# Patient Record
Sex: Female | Born: 1960 | Race: White | Hispanic: No | Marital: Single | Smoking: Never smoker
Health system: Southern US, Community
[De-identification: ages and names within clinical notes are randomized; demographics above are authoritative.]

## PROBLEM LIST (undated history)

## (undated) DIAGNOSIS — T7840XA Allergy, unspecified, initial encounter: Secondary | ICD-10-CM

## (undated) DIAGNOSIS — N83209 Unspecified ovarian cyst, unspecified side: Secondary | ICD-10-CM

## (undated) DIAGNOSIS — K449 Diaphragmatic hernia without obstruction or gangrene: Secondary | ICD-10-CM

## (undated) DIAGNOSIS — K589 Irritable bowel syndrome without diarrhea: Secondary | ICD-10-CM

## (undated) DIAGNOSIS — R112 Nausea with vomiting, unspecified: Secondary | ICD-10-CM

## (undated) DIAGNOSIS — K219 Gastro-esophageal reflux disease without esophagitis: Secondary | ICD-10-CM

## (undated) DIAGNOSIS — S0300XA Dislocation of jaw, unspecified side, initial encounter: Secondary | ICD-10-CM

## (undated) DIAGNOSIS — K625 Hemorrhage of anus and rectum: Secondary | ICD-10-CM

## (undated) DIAGNOSIS — K626 Ulcer of anus and rectum: Secondary | ICD-10-CM

## (undated) DIAGNOSIS — J349 Unspecified disorder of nose and nasal sinuses: Secondary | ICD-10-CM

## (undated) DIAGNOSIS — M810 Age-related osteoporosis without current pathological fracture: Secondary | ICD-10-CM

## (undated) DIAGNOSIS — E559 Vitamin D deficiency, unspecified: Secondary | ICD-10-CM

## (undated) DIAGNOSIS — I10 Essential (primary) hypertension: Secondary | ICD-10-CM

## (undated) HISTORY — DX: Essential (primary) hypertension: I10

## (undated) HISTORY — DX: Unspecified disorder of nose and nasal sinuses: J34.9

## (undated) HISTORY — DX: Gastro-esophageal reflux disease without esophagitis: K21.9

## (undated) HISTORY — DX: Age-related osteoporosis without current pathological fracture: M81.0

## (undated) HISTORY — PX: ANAL FISSURE REPAIR: SHX2312

## (undated) HISTORY — PX: FOOT SURGERY: SHX648

## (undated) HISTORY — PX: COLONOSCOPY W/ POLYPECTOMY: SHX1380

## (undated) HISTORY — PX: BACK SURGERY: SHX140

## (undated) HISTORY — PX: HEMORROIDECTOMY: SUR656

## (undated) HISTORY — DX: Nausea with vomiting, unspecified: R11.2

## (undated) HISTORY — DX: Dislocation of jaw, unspecified side, initial encounter: S03.00XA

## (undated) HISTORY — DX: Ulcer of anus and rectum: K62.6

## (undated) HISTORY — DX: Diaphragmatic hernia without obstruction or gangrene: K44.9

## (undated) HISTORY — DX: Irritable bowel syndrome, unspecified: K58.9

## (undated) HISTORY — DX: Vitamin D deficiency, unspecified: E55.9

## (undated) HISTORY — DX: Hemorrhage of anus and rectum: K62.5

## (undated) HISTORY — DX: Allergy, unspecified, initial encounter: T78.40XA

## (undated) HISTORY — PX: EYE SURGERY: SHX253

---

## 1982-09-15 HISTORY — PX: CHOLECYSTECTOMY: SHX55

## 1997-12-12 ENCOUNTER — Other Ambulatory Visit: Admission: RE | Admit: 1997-12-12 | Discharge: 1997-12-12 | Payer: Self-pay | Admitting: Family Medicine

## 1997-12-25 ENCOUNTER — Other Ambulatory Visit: Admission: RE | Admit: 1997-12-25 | Discharge: 1997-12-25 | Payer: Self-pay | Admitting: Family Medicine

## 1997-12-27 ENCOUNTER — Ambulatory Visit (HOSPITAL_COMMUNITY): Admission: RE | Admit: 1997-12-27 | Discharge: 1997-12-27 | Payer: Self-pay

## 1998-01-03 ENCOUNTER — Ambulatory Visit (HOSPITAL_COMMUNITY): Admission: RE | Admit: 1998-01-03 | Discharge: 1998-01-03 | Payer: Self-pay

## 1998-01-22 ENCOUNTER — Other Ambulatory Visit: Admission: RE | Admit: 1998-01-22 | Discharge: 1998-01-22 | Payer: Self-pay | Admitting: Family Medicine

## 1998-04-05 ENCOUNTER — Other Ambulatory Visit: Admission: RE | Admit: 1998-04-05 | Discharge: 1998-04-05 | Payer: Self-pay | Admitting: Family Medicine

## 1998-11-26 ENCOUNTER — Ambulatory Visit (HOSPITAL_COMMUNITY): Admission: RE | Admit: 1998-11-26 | Discharge: 1998-11-26 | Payer: Self-pay

## 1998-11-27 ENCOUNTER — Ambulatory Visit (HOSPITAL_BASED_OUTPATIENT_CLINIC_OR_DEPARTMENT_OTHER): Admission: RE | Admit: 1998-11-27 | Discharge: 1998-11-27 | Payer: Self-pay

## 1999-01-15 ENCOUNTER — Other Ambulatory Visit: Admission: RE | Admit: 1999-01-15 | Discharge: 1999-01-15 | Payer: Self-pay | Admitting: Family Medicine

## 1999-10-29 ENCOUNTER — Ambulatory Visit (HOSPITAL_BASED_OUTPATIENT_CLINIC_OR_DEPARTMENT_OTHER): Admission: RE | Admit: 1999-10-29 | Discharge: 1999-10-29 | Payer: Self-pay

## 2000-03-25 ENCOUNTER — Other Ambulatory Visit: Admission: RE | Admit: 2000-03-25 | Discharge: 2000-03-25 | Payer: Self-pay | Admitting: Family Medicine

## 2000-04-03 ENCOUNTER — Ambulatory Visit (HOSPITAL_COMMUNITY): Admission: RE | Admit: 2000-04-03 | Discharge: 2000-04-03 | Payer: Self-pay | Admitting: *Deleted

## 2000-07-08 ENCOUNTER — Encounter (INDEPENDENT_AMBULATORY_CARE_PROVIDER_SITE_OTHER): Payer: Self-pay | Admitting: *Deleted

## 2000-07-08 ENCOUNTER — Ambulatory Visit (HOSPITAL_BASED_OUTPATIENT_CLINIC_OR_DEPARTMENT_OTHER): Admission: RE | Admit: 2000-07-08 | Discharge: 2000-07-08 | Payer: Self-pay

## 2000-10-29 ENCOUNTER — Other Ambulatory Visit: Admission: RE | Admit: 2000-10-29 | Discharge: 2000-10-29 | Payer: Self-pay

## 2000-10-29 ENCOUNTER — Encounter (INDEPENDENT_AMBULATORY_CARE_PROVIDER_SITE_OTHER): Payer: Self-pay | Admitting: Specialist

## 2001-01-19 ENCOUNTER — Encounter (INDEPENDENT_AMBULATORY_CARE_PROVIDER_SITE_OTHER): Payer: Self-pay

## 2001-01-19 ENCOUNTER — Ambulatory Visit (HOSPITAL_COMMUNITY): Admission: RE | Admit: 2001-01-19 | Discharge: 2001-01-19 | Payer: Self-pay

## 2001-07-23 ENCOUNTER — Other Ambulatory Visit: Admission: RE | Admit: 2001-07-23 | Discharge: 2001-07-23 | Payer: Self-pay | Admitting: Family Medicine

## 2001-11-30 ENCOUNTER — Encounter (INDEPENDENT_AMBULATORY_CARE_PROVIDER_SITE_OTHER): Payer: Self-pay

## 2001-11-30 ENCOUNTER — Ambulatory Visit (HOSPITAL_COMMUNITY): Admission: RE | Admit: 2001-11-30 | Discharge: 2001-11-30 | Payer: Self-pay

## 2002-02-16 ENCOUNTER — Encounter (INDEPENDENT_AMBULATORY_CARE_PROVIDER_SITE_OTHER): Payer: Self-pay | Admitting: *Deleted

## 2002-02-16 ENCOUNTER — Ambulatory Visit (HOSPITAL_BASED_OUTPATIENT_CLINIC_OR_DEPARTMENT_OTHER): Admission: RE | Admit: 2002-02-16 | Discharge: 2002-02-16 | Payer: Self-pay

## 2002-03-28 ENCOUNTER — Ambulatory Visit (HOSPITAL_COMMUNITY): Admission: RE | Admit: 2002-03-28 | Discharge: 2002-03-28 | Payer: Self-pay

## 2002-03-28 ENCOUNTER — Encounter (INDEPENDENT_AMBULATORY_CARE_PROVIDER_SITE_OTHER): Payer: Self-pay | Admitting: Specialist

## 2002-11-29 ENCOUNTER — Encounter (INDEPENDENT_AMBULATORY_CARE_PROVIDER_SITE_OTHER): Payer: Self-pay | Admitting: Specialist

## 2002-11-29 ENCOUNTER — Ambulatory Visit (HOSPITAL_COMMUNITY): Admission: RE | Admit: 2002-11-29 | Discharge: 2002-11-29 | Payer: Self-pay

## 2003-02-24 ENCOUNTER — Ambulatory Visit (HOSPITAL_COMMUNITY): Admission: RE | Admit: 2003-02-24 | Discharge: 2003-02-24 | Payer: Self-pay

## 2003-05-15 ENCOUNTER — Encounter: Payer: Self-pay | Admitting: Occupational Therapy

## 2003-05-15 ENCOUNTER — Ambulatory Visit (HOSPITAL_COMMUNITY): Admission: RE | Admit: 2003-05-15 | Discharge: 2003-05-15 | Payer: Self-pay | Admitting: Family Medicine

## 2003-11-15 ENCOUNTER — Encounter (INDEPENDENT_AMBULATORY_CARE_PROVIDER_SITE_OTHER): Payer: Self-pay | Admitting: Specialist

## 2003-11-15 ENCOUNTER — Ambulatory Visit (HOSPITAL_COMMUNITY): Admission: RE | Admit: 2003-11-15 | Discharge: 2003-11-15 | Payer: Self-pay

## 2003-11-15 ENCOUNTER — Ambulatory Visit (HOSPITAL_BASED_OUTPATIENT_CLINIC_OR_DEPARTMENT_OTHER): Admission: RE | Admit: 2003-11-15 | Discharge: 2003-11-15 | Payer: Self-pay

## 2004-02-06 ENCOUNTER — Ambulatory Visit (HOSPITAL_COMMUNITY): Admission: RE | Admit: 2004-02-06 | Discharge: 2004-02-06 | Payer: Self-pay

## 2004-02-06 ENCOUNTER — Ambulatory Visit (HOSPITAL_BASED_OUTPATIENT_CLINIC_OR_DEPARTMENT_OTHER): Admission: RE | Admit: 2004-02-06 | Discharge: 2004-02-06 | Payer: Self-pay

## 2004-05-24 ENCOUNTER — Ambulatory Visit: Payer: Self-pay | Admitting: Family Medicine

## 2004-05-24 ENCOUNTER — Other Ambulatory Visit: Admission: RE | Admit: 2004-05-24 | Discharge: 2004-05-24 | Payer: Self-pay | Admitting: Family Medicine

## 2004-06-20 ENCOUNTER — Ambulatory Visit: Payer: Self-pay | Admitting: Family Medicine

## 2004-06-26 ENCOUNTER — Ambulatory Visit: Payer: Self-pay | Admitting: Family Medicine

## 2004-08-28 ENCOUNTER — Ambulatory Visit: Payer: Self-pay | Admitting: Family Medicine

## 2004-10-01 ENCOUNTER — Ambulatory Visit (HOSPITAL_COMMUNITY): Admission: RE | Admit: 2004-10-01 | Discharge: 2004-10-01 | Payer: Self-pay

## 2004-10-01 ENCOUNTER — Encounter (INDEPENDENT_AMBULATORY_CARE_PROVIDER_SITE_OTHER): Payer: Self-pay | Admitting: Specialist

## 2004-10-01 ENCOUNTER — Ambulatory Visit (HOSPITAL_BASED_OUTPATIENT_CLINIC_OR_DEPARTMENT_OTHER): Admission: RE | Admit: 2004-10-01 | Discharge: 2004-10-01 | Payer: Self-pay

## 2004-12-23 ENCOUNTER — Ambulatory Visit: Payer: Self-pay | Admitting: Family Medicine

## 2004-12-25 ENCOUNTER — Ambulatory Visit: Payer: Self-pay | Admitting: Family Medicine

## 2005-01-27 ENCOUNTER — Ambulatory Visit (HOSPITAL_COMMUNITY): Admission: RE | Admit: 2005-01-27 | Discharge: 2005-01-27 | Payer: Self-pay

## 2005-02-03 ENCOUNTER — Ambulatory Visit: Payer: Self-pay | Admitting: Family Medicine

## 2005-02-14 ENCOUNTER — Ambulatory Visit: Payer: Self-pay | Admitting: Family Medicine

## 2005-04-09 ENCOUNTER — Ambulatory Visit: Payer: Self-pay | Admitting: Internal Medicine

## 2005-05-05 ENCOUNTER — Ambulatory Visit: Payer: Self-pay | Admitting: Family Medicine

## 2005-05-14 ENCOUNTER — Ambulatory Visit (HOSPITAL_COMMUNITY): Admission: RE | Admit: 2005-05-14 | Discharge: 2005-05-14 | Payer: Self-pay | Admitting: Family Medicine

## 2005-08-21 ENCOUNTER — Ambulatory Visit: Payer: Self-pay | Admitting: Family Medicine

## 2005-08-29 ENCOUNTER — Ambulatory Visit: Payer: Self-pay | Admitting: Family Medicine

## 2005-09-17 ENCOUNTER — Ambulatory Visit (HOSPITAL_COMMUNITY): Admission: RE | Admit: 2005-09-17 | Discharge: 2005-09-17 | Payer: Self-pay

## 2005-09-17 ENCOUNTER — Encounter (INDEPENDENT_AMBULATORY_CARE_PROVIDER_SITE_OTHER): Payer: Self-pay | Admitting: Specialist

## 2005-12-16 ENCOUNTER — Ambulatory Visit: Payer: Self-pay | Admitting: Family Medicine

## 2005-12-22 ENCOUNTER — Ambulatory Visit: Payer: Self-pay | Admitting: Family Medicine

## 2006-01-16 ENCOUNTER — Ambulatory Visit (HOSPITAL_COMMUNITY): Admission: RE | Admit: 2006-01-16 | Discharge: 2006-01-16 | Payer: Self-pay

## 2006-01-16 ENCOUNTER — Encounter (INDEPENDENT_AMBULATORY_CARE_PROVIDER_SITE_OTHER): Payer: Self-pay | Admitting: Specialist

## 2006-03-19 ENCOUNTER — Ambulatory Visit (HOSPITAL_COMMUNITY): Admission: RE | Admit: 2006-03-19 | Discharge: 2006-03-19 | Payer: Self-pay | Admitting: Gastroenterology

## 2006-04-20 ENCOUNTER — Ambulatory Visit: Payer: Self-pay | Admitting: Family Medicine

## 2006-04-29 ENCOUNTER — Ambulatory Visit: Payer: Self-pay | Admitting: Family Medicine

## 2006-05-27 ENCOUNTER — Ambulatory Visit (HOSPITAL_COMMUNITY): Admission: RE | Admit: 2006-05-27 | Discharge: 2006-05-27 | Payer: Self-pay | Admitting: Family Medicine

## 2006-06-29 ENCOUNTER — Ambulatory Visit: Payer: Self-pay | Admitting: Family Medicine

## 2006-06-29 LAB — CONVERTED CEMR LAB

## 2006-09-28 ENCOUNTER — Ambulatory Visit (HOSPITAL_COMMUNITY): Admission: RE | Admit: 2006-09-28 | Discharge: 2006-09-28 | Payer: Self-pay

## 2006-09-28 ENCOUNTER — Encounter (INDEPENDENT_AMBULATORY_CARE_PROVIDER_SITE_OTHER): Payer: Self-pay | Admitting: *Deleted

## 2006-11-02 ENCOUNTER — Ambulatory Visit: Payer: Self-pay | Admitting: Family Medicine

## 2006-12-28 ENCOUNTER — Encounter: Admission: RE | Admit: 2006-12-28 | Discharge: 2006-12-28 | Payer: Self-pay | Admitting: Gastroenterology

## 2007-01-07 ENCOUNTER — Ambulatory Visit (HOSPITAL_COMMUNITY): Admission: RE | Admit: 2007-01-07 | Discharge: 2007-01-07 | Payer: Self-pay | Admitting: Gastroenterology

## 2007-01-21 ENCOUNTER — Emergency Department (HOSPITAL_COMMUNITY): Admission: EM | Admit: 2007-01-21 | Discharge: 2007-01-21 | Payer: Self-pay | Admitting: Emergency Medicine

## 2007-02-19 ENCOUNTER — Encounter (INDEPENDENT_AMBULATORY_CARE_PROVIDER_SITE_OTHER): Payer: Self-pay | Admitting: Family Medicine

## 2007-02-19 DIAGNOSIS — D509 Iron deficiency anemia, unspecified: Secondary | ICD-10-CM

## 2007-02-19 DIAGNOSIS — K649 Unspecified hemorrhoids: Secondary | ICD-10-CM | POA: Insufficient documentation

## 2007-02-19 DIAGNOSIS — R197 Diarrhea, unspecified: Secondary | ICD-10-CM | POA: Insufficient documentation

## 2007-02-19 DIAGNOSIS — K589 Irritable bowel syndrome without diarrhea: Secondary | ICD-10-CM

## 2007-02-19 DIAGNOSIS — N390 Urinary tract infection, site not specified: Secondary | ICD-10-CM | POA: Insufficient documentation

## 2007-02-19 DIAGNOSIS — F6812 Factitious disorder with predominantly physical signs and symptoms: Secondary | ICD-10-CM

## 2007-02-19 DIAGNOSIS — J309 Allergic rhinitis, unspecified: Secondary | ICD-10-CM | POA: Insufficient documentation

## 2007-02-26 ENCOUNTER — Encounter: Admission: RE | Admit: 2007-02-26 | Discharge: 2007-02-26 | Payer: Self-pay | Admitting: Gastroenterology

## 2007-04-26 ENCOUNTER — Ambulatory Visit: Payer: Self-pay | Admitting: Family Medicine

## 2007-04-26 ENCOUNTER — Encounter (INDEPENDENT_AMBULATORY_CARE_PROVIDER_SITE_OTHER): Payer: Self-pay | Admitting: Internal Medicine

## 2007-04-26 LAB — CONVERTED CEMR LAB
BUN: 7 mg/dL (ref 6–23)
Basophils Absolute: 0 10*3/uL (ref 0.0–0.1)
CO2: 21 meq/L (ref 19–32)
Calcium: 9.6 mg/dL (ref 8.4–10.5)
Cholesterol: 183 mg/dL (ref 0–200)
Creatinine, Ser: 0.6 mg/dL (ref 0.40–1.20)
Eosinophils Relative: 1 % (ref 0–5)
Glucose, Bld: 84 mg/dL (ref 70–99)
HCT: 34.3 % — ABNORMAL LOW (ref 36.0–46.0)
HDL: 61 mg/dL (ref >39)
Hemoglobin: 10.1 g/dL — ABNORMAL LOW (ref 12.0–15.0)
Lymphocytes Relative: 25 % (ref 12–46)
Monocytes Absolute: 0.2 10*3/uL (ref 0.2–0.7)
Monocytes Relative: 4 % (ref 3–11)
RBC: 4.47 M/uL (ref 3.87–5.11)
RDW: 16.8 % — ABNORMAL HIGH (ref 11.5–14.0)
Total Bilirubin: 0.5 mg/dL (ref 0.3–1.2)
Total CHOL/HDL Ratio: 3
Triglycerides: 98 mg/dL (ref <150)
VLDL: 20 mg/dL (ref 0–40)

## 2007-05-10 ENCOUNTER — Ambulatory Visit: Payer: Self-pay | Admitting: Family Medicine

## 2007-06-01 ENCOUNTER — Ambulatory Visit (HOSPITAL_COMMUNITY): Admission: RE | Admit: 2007-06-01 | Discharge: 2007-06-01 | Payer: Self-pay | Admitting: Occupational Therapy

## 2007-06-08 ENCOUNTER — Encounter: Admission: RE | Admit: 2007-06-08 | Discharge: 2007-06-08 | Payer: Self-pay | Admitting: Family Medicine

## 2007-12-07 ENCOUNTER — Ambulatory Visit: Payer: Self-pay | Admitting: Internal Medicine

## 2008-06-01 ENCOUNTER — Ambulatory Visit (HOSPITAL_COMMUNITY): Admission: RE | Admit: 2008-06-01 | Discharge: 2008-06-01 | Payer: Self-pay | Admitting: Family Medicine

## 2008-06-30 ENCOUNTER — Ambulatory Visit: Payer: Self-pay | Admitting: Internal Medicine

## 2008-06-30 ENCOUNTER — Encounter (INDEPENDENT_AMBULATORY_CARE_PROVIDER_SITE_OTHER): Payer: Self-pay | Admitting: Adult Health

## 2008-06-30 ENCOUNTER — Other Ambulatory Visit: Admission: RE | Admit: 2008-06-30 | Discharge: 2008-06-30 | Payer: Self-pay | Admitting: Adult Health

## 2008-06-30 LAB — CONVERTED CEMR LAB
ALT: 8 units/L (ref 0–35)
BUN: 10 mg/dL (ref 6–23)
Basophils Absolute: 0 10*3/uL (ref 0.0–0.1)
CO2: 25 meq/L (ref 19–32)
Calcium: 9.7 mg/dL (ref 8.4–10.5)
Chloride: 104 meq/L (ref 96–112)
Creatinine, Ser: 0.66 mg/dL (ref 0.40–1.20)
Glucose, Bld: 103 mg/dL — ABNORMAL HIGH (ref 70–99)
HCT: 43.5 % (ref 36.0–46.0)
Hemoglobin: 14.7 g/dL (ref 12.0–15.0)
Lymphocytes Relative: 39 % (ref 12–46)
Lymphs Abs: 2.2 10*3/uL (ref 0.7–4.0)
Monocytes Absolute: 0.3 10*3/uL (ref 0.1–1.0)
Monocytes Relative: 5 % (ref 3–12)
Neutro Abs: 3.1 10*3/uL (ref 1.7–7.7)
RBC: 4.82 M/uL (ref 3.87–5.11)
T4, Total: 9 ug/dL (ref 5.0–12.5)
TSH: 1.457 microintl units/mL (ref 0.350–4.50)
Vit D, 1,25-Dihydroxy: 9 — ABNORMAL LOW (ref 30–89)
WBC: 5.7 10*3/uL (ref 4.0–10.5)

## 2008-07-18 ENCOUNTER — Encounter (INDEPENDENT_AMBULATORY_CARE_PROVIDER_SITE_OTHER): Payer: Self-pay | Admitting: Adult Health

## 2008-07-18 ENCOUNTER — Ambulatory Visit: Payer: Self-pay | Admitting: Internal Medicine

## 2008-07-18 LAB — CONVERTED CEMR LAB
LDL Cholesterol: 117 mg/dL — ABNORMAL HIGH (ref 0–99)
Total CHOL/HDL Ratio: 3
VLDL: 15 mg/dL (ref 0–40)

## 2008-11-10 ENCOUNTER — Ambulatory Visit: Payer: Self-pay | Admitting: Internal Medicine

## 2008-11-10 ENCOUNTER — Encounter (INDEPENDENT_AMBULATORY_CARE_PROVIDER_SITE_OTHER): Payer: Self-pay | Admitting: Adult Health

## 2008-11-10 LAB — CONVERTED CEMR LAB
ALT: 8 units/L (ref 0–35)
AST: 15 units/L (ref 0–37)
Albumin: 4.7 g/dL (ref 3.5–5.2)
Basophils Absolute: 0 10*3/uL (ref 0.0–0.1)
Basophils Relative: 0 % (ref 0–1)
Calcium: 9.8 mg/dL (ref 8.4–10.5)
Chloride: 105 meq/L (ref 96–112)
MCHC: 32.3 g/dL (ref 30.0–36.0)
Monocytes Relative: 5 % (ref 3–12)
Neutro Abs: 3.1 10*3/uL (ref 1.7–7.7)
Neutrophils Relative %: 68 % (ref 43–77)
Potassium: 4 meq/L (ref 3.5–5.3)
RBC: 4.99 M/uL (ref 3.87–5.11)
RDW: 13.5 % (ref 11.5–15.5)

## 2008-11-11 ENCOUNTER — Encounter (INDEPENDENT_AMBULATORY_CARE_PROVIDER_SITE_OTHER): Payer: Self-pay | Admitting: Adult Health

## 2008-11-17 ENCOUNTER — Ambulatory Visit: Payer: Self-pay | Admitting: Internal Medicine

## 2008-11-18 ENCOUNTER — Encounter (INDEPENDENT_AMBULATORY_CARE_PROVIDER_SITE_OTHER): Payer: Self-pay | Admitting: Adult Health

## 2008-12-01 ENCOUNTER — Encounter (INDEPENDENT_AMBULATORY_CARE_PROVIDER_SITE_OTHER): Payer: Self-pay | Admitting: Adult Health

## 2008-12-01 ENCOUNTER — Ambulatory Visit: Payer: Self-pay | Admitting: Internal Medicine

## 2008-12-01 LAB — CONVERTED CEMR LAB
AST: 12 units/L (ref 0–37)
Albumin: 4.5 g/dL (ref 3.5–5.2)
Alkaline Phosphatase: 69 units/L (ref 39–117)
BUN: 8 mg/dL (ref 6–23)
Basophils Absolute: 0 10*3/uL (ref 0.0–0.1)
Lymphocytes Relative: 30 % (ref 12–46)
Neutro Abs: 2.8 10*3/uL (ref 1.7–7.7)
Platelets: 209 10*3/uL (ref 150–400)
Potassium: 4.3 meq/L (ref 3.5–5.3)
RDW: 13.1 % (ref 11.5–15.5)
Sodium: 141 meq/L (ref 135–145)
Total Protein: 7.5 g/dL (ref 6.0–8.3)

## 2008-12-02 ENCOUNTER — Encounter (INDEPENDENT_AMBULATORY_CARE_PROVIDER_SITE_OTHER): Payer: Self-pay | Admitting: Adult Health

## 2008-12-11 ENCOUNTER — Other Ambulatory Visit: Admission: RE | Admit: 2008-12-11 | Discharge: 2008-12-11 | Payer: Self-pay | Admitting: Internal Medicine

## 2008-12-11 ENCOUNTER — Encounter: Payer: Self-pay | Admitting: Internal Medicine

## 2008-12-11 ENCOUNTER — Ambulatory Visit: Payer: Self-pay | Admitting: Internal Medicine

## 2008-12-12 ENCOUNTER — Encounter (INDEPENDENT_AMBULATORY_CARE_PROVIDER_SITE_OTHER): Payer: Self-pay | Admitting: Internal Medicine

## 2008-12-13 ENCOUNTER — Ambulatory Visit (HOSPITAL_COMMUNITY): Admission: RE | Admit: 2008-12-13 | Discharge: 2008-12-13 | Payer: Self-pay | Admitting: Internal Medicine

## 2008-12-20 ENCOUNTER — Ambulatory Visit: Payer: Self-pay | Admitting: Family Medicine

## 2008-12-27 ENCOUNTER — Ambulatory Visit: Payer: Self-pay | Admitting: Internal Medicine

## 2008-12-28 ENCOUNTER — Encounter (INDEPENDENT_AMBULATORY_CARE_PROVIDER_SITE_OTHER): Payer: Self-pay | Admitting: Adult Health

## 2009-01-27 ENCOUNTER — Emergency Department (HOSPITAL_COMMUNITY): Admission: EM | Admit: 2009-01-27 | Discharge: 2009-01-27 | Payer: Self-pay | Admitting: Family Medicine

## 2009-01-28 ENCOUNTER — Emergency Department (HOSPITAL_COMMUNITY): Admission: EM | Admit: 2009-01-28 | Discharge: 2009-01-28 | Payer: Self-pay | Admitting: Family Medicine

## 2009-01-31 ENCOUNTER — Ambulatory Visit: Payer: Self-pay | Admitting: Internal Medicine

## 2009-03-05 ENCOUNTER — Ambulatory Visit: Payer: Self-pay | Admitting: Internal Medicine

## 2009-03-06 ENCOUNTER — Encounter (INDEPENDENT_AMBULATORY_CARE_PROVIDER_SITE_OTHER): Payer: Self-pay | Admitting: Adult Health

## 2009-06-04 ENCOUNTER — Ambulatory Visit (HOSPITAL_COMMUNITY): Admission: RE | Admit: 2009-06-04 | Discharge: 2009-06-04 | Payer: Self-pay | Admitting: Family Medicine

## 2009-07-12 ENCOUNTER — Emergency Department (HOSPITAL_COMMUNITY): Admission: EM | Admit: 2009-07-12 | Discharge: 2009-07-12 | Payer: Self-pay | Admitting: Emergency Medicine

## 2009-11-30 ENCOUNTER — Telehealth (INDEPENDENT_AMBULATORY_CARE_PROVIDER_SITE_OTHER): Payer: Self-pay | Admitting: Adult Health

## 2010-01-09 ENCOUNTER — Encounter (INDEPENDENT_AMBULATORY_CARE_PROVIDER_SITE_OTHER): Payer: Self-pay | Admitting: Adult Health

## 2010-01-09 ENCOUNTER — Ambulatory Visit: Payer: Self-pay | Admitting: Family Medicine

## 2010-01-09 LAB — CONVERTED CEMR LAB
ALT: 8 units/L (ref 0–35)
AST: 13 units/L (ref 0–37)
Basophils Relative: 0 % (ref 0–1)
CO2: 23 meq/L (ref 19–32)
Chloride: 103 meq/L (ref 96–112)
Cholesterol: 201 mg/dL — ABNORMAL HIGH (ref 0–200)
Creatinine, Ser: 0.68 mg/dL (ref 0.40–1.20)
Lymphs Abs: 1.4 10*3/uL (ref 0.7–4.0)
Monocytes Relative: 6 % (ref 3–12)
Neutro Abs: 2.9 10*3/uL (ref 1.7–7.7)
Neutrophils Relative %: 63 % (ref 43–77)
Platelets: 164 10*3/uL (ref 150–400)
RBC: 4.69 M/uL (ref 3.87–5.11)
Sodium: 139 meq/L (ref 135–145)
Total Bilirubin: 0.9 mg/dL (ref 0.3–1.2)
Total CHOL/HDL Ratio: 3
Total Protein: 7.7 g/dL (ref 6.0–8.3)
VLDL: 11 mg/dL (ref 0–40)
WBC: 4.6 10*3/uL (ref 4.0–10.5)

## 2010-02-19 ENCOUNTER — Ambulatory Visit: Payer: Self-pay | Admitting: Internal Medicine

## 2010-02-19 ENCOUNTER — Encounter (INDEPENDENT_AMBULATORY_CARE_PROVIDER_SITE_OTHER): Payer: Self-pay | Admitting: Adult Health

## 2010-02-19 ENCOUNTER — Other Ambulatory Visit: Admission: RE | Admit: 2010-02-19 | Discharge: 2010-02-19 | Payer: Self-pay | Admitting: Family Medicine

## 2010-02-19 LAB — CONVERTED CEMR LAB
Microalb, Ur: 0.5 mg/dL (ref 0.00–1.89)
Pap Smear: NEGATIVE

## 2010-05-14 ENCOUNTER — Encounter (INDEPENDENT_AMBULATORY_CARE_PROVIDER_SITE_OTHER): Payer: Self-pay | Admitting: Family Medicine

## 2010-05-14 ENCOUNTER — Ambulatory Visit: Payer: Self-pay | Admitting: Internal Medicine

## 2010-05-14 LAB — CONVERTED CEMR LAB
ALT: <8 units/L (ref 0–35)
AST: 14 units/L (ref 0–37)
Alkaline Phosphatase: 66 units/L (ref 39–117)
CO2: 23 meq/L (ref 19–32)
Cholesterol: 187 mg/dL (ref 0–200)
Creatinine, Ser: 0.66 mg/dL (ref 0.40–1.20)
LDL Cholesterol: 107 mg/dL — ABNORMAL HIGH (ref 0–99)
Sodium: 141 meq/L (ref 135–145)
Total Bilirubin: 1 mg/dL (ref 0.3–1.2)
Total CHOL/HDL Ratio: 3
Total Protein: 7.6 g/dL (ref 6.0–8.3)
VLDL: 17 mg/dL (ref 0–40)
Vit D, 25-Hydroxy: 24 ng/mL — ABNORMAL LOW (ref 30–89)

## 2010-06-05 ENCOUNTER — Ambulatory Visit (HOSPITAL_COMMUNITY)
Admission: RE | Admit: 2010-06-05 | Discharge: 2010-06-05 | Payer: Self-pay | Source: Home / Self Care | Admitting: Internal Medicine

## 2010-06-07 ENCOUNTER — Encounter (INDEPENDENT_AMBULATORY_CARE_PROVIDER_SITE_OTHER): Payer: Self-pay | Admitting: *Deleted

## 2010-06-07 LAB — CONVERTED CEMR LAB
ALT: 8 units/L (ref 0–35)
Albumin: 4.6 g/dL (ref 3.5–5.2)
Alkaline Phosphatase: 65 units/L (ref 39–117)
CO2: 24 meq/L (ref 19–32)
Eosinophils Absolute: 0 10*3/uL (ref 0.0–0.7)
Lipase: 38 units/L (ref 0–75)
Lymphs Abs: 1.6 10*3/uL (ref 0.7–4.0)
MCV: 90.7 fL (ref 78.0–100.0)
Monocytes Relative: 5 % (ref 3–12)
Neutro Abs: 2.9 10*3/uL (ref 1.7–7.7)
Neutrophils Relative %: 61 % (ref 43–77)
Platelets: 210 10*3/uL (ref 150–400)
Potassium: 4 meq/L (ref 3.5–5.3)
RBC: 4.64 M/uL (ref 3.87–5.11)
Sed Rate: 2 mm/hr (ref 0–22)
Sodium: 141 meq/L (ref 135–145)
Total Bilirubin: 0.4 mg/dL (ref 0.3–1.2)
Total Protein: 7.4 g/dL (ref 6.0–8.3)
WBC: 4.9 10*3/uL (ref 4.0–10.5)

## 2010-10-15 NOTE — Progress Notes (Signed)
Summary: poss UTI  Phone Note Call from Patient   Summary of Call: Having back pain, buring and pain on urination. She had called [facility] they told her her referral was still active and agreed to see her today. Given history of chronic cystitis, urethritis I encouraged her to go ahead and see urologist.  Initial call taken by: Gaylyn Cheers RN,  November 30, 2009 9:56 AM

## 2010-12-24 LAB — POCT URINALYSIS DIP (DEVICE)
Ketones, ur: NEGATIVE mg/dL
Protein, ur: NEGATIVE mg/dL
Specific Gravity, Urine: >=1.03 (ref 1.005–1.030)
Urobilinogen, UA: 0.2 mg/dL (ref 0.0–1.0)
pH: 5.5 (ref 5.0–8.0)

## 2011-01-31 NOTE — Op Note (Signed)
NAMEDELORA, GRAVATT                      ACCOUNT NO.:  [id number]   MEDICAL RECORD NO.:  [id number]                   PATIENT TYPE:  AMB   LOCATION:  DSC                                  FACILITY:  [facility]   PHYSICIAN:  Lorre Munroe., M.D.            DATE OF BIRTH:  [birth year]   DATE OF PROCEDURE:  11/15/2003  DATE OF DISCHARGE:                                 OPERATIVE REPORT   PREOPERATIVE DIAGNOSIS:  Recurrent anal fissures.   POSTOPERATIVE DIAGNOSIS:  Recurrent anal fissures.   OPERATION:  Excision of anal fissures.   SURGEON:  Lebron Conners, M.D.   ANESTHESIA:  General.   DESCRIPTION OF PROCEDURE:  After the patient was monitored and anesthetized  and had routine preparation and draping of the perineum, I infiltrated local  anesthetic at the site of two fissures, one in the left posterior area which  was quite deep and chronic in appearance, and another one chronic but  slightly more acute in appearance and not as large in the posterior midline  or just to the right of the posterior midline.  I elliptically excised both  fissures, got hemostasis with cautery, and closed the wounds with running 3-  0 chromic.  No sphincterotomy was necessary as this had been done before and  anus was not stenotic.  I saw no other abnormalities of the distal rectum or  anal canal.  The patient was awakened after application of a small bandage  and she tolerated the operation well.                                               Lorre Munroe., M.D.    Jodi Marble  D:  11/15/2003  T:  11/15/2003  Job:  [id number]

## 2011-01-31 NOTE — Op Note (Signed)
Heather, Gillespie            ACCOUNT NO.:  [id number]   MEDICAL RECORD NO.:  [id number]          PATIENT TYPE:  AMB   LOCATION:  NESC                         FACILITY:  [facility]   PHYSICIAN:  Lorre Munroe., M.D.DATE OF BIRTH:  04/09/1961   DATE OF PROCEDURE:  10/01/2004  DATE OF DISCHARGE:                                 OPERATIVE REPORT   PREOPERATIVE DIAGNOSIS:  Anal fissures.   POSTOPERATIVE DIAGNOSIS:  Anal fissures.   OPERATION PERFORMED:  Excision of anal fissures.   SURGEON:  Lebron Conners, M.D.   ANESTHESIA:  General.   DESCRIPTION OF PROCEDURE:  After the patient was monitored and anesthetized  and had routine preparation and draping of the anal area with her positioned  in the dorsolithotomy position, I thoroughly examined the anal area.  I  found a small fissure in the posterior midline and another one eccentrically  on the left side and about the 4 o'clock position. It was larger and deeper.  There was no spasm of the internal sphincter. There was no evident  abnormality of the distal rectal mucosa, no mass palpable.  I thoroughly  anesthetized the anorectal region and then I elliptically excised the  fissure on the left side and got hemostasis with cautery, taking the  excision right down to the internal sphincter but excising only very few  fibers of the internal sphincter. I felt that a sphincterotomy was not  necessary because of the laxity of the anal canal and the recurring  chronicity of these ulcers. I closed the wound with running 3-0 chromic. I  then similarly excised the smaller midline fissure and got hemostasis and  closed the wound with running 3-0 chromic.  I then very carefully checked  the remainder of the anus and saw no more injuries or fissures.  I applied a  small bandage and concluded the operation. She tolerated it well.     Will   WB/MEDQ  D:  10/01/2004  T:  10/01/2004  Job:  [id number]   cc:   Willis Mod[place], [facility]

## 2011-01-31 NOTE — Op Note (Signed)
[place]. [facility]  Patient:    Heather Gillespie, Heather Gillespie                     MRN: [id number] Proc. Date: 07/08/00 Adm. Date:  [id number] Disc. Date: [id number] Attending:  Meredith Leeds                           Operative Report  PREOPERATIVE DIAGNOSIS:  Anal fissure.  POSTOPERATIVE DIAGNOSIS:  Anal fissure.  OPERATION:  Excision of anal fissure.  SURGEON:  Zigmund Daniel, M.D.  ANESTHESIA:  General.  DESCRIPTION OF PROCEDURE:  After the patient was adequately anesthetized and prepped and draped, liberally infused local anesthetic in the perianal region. I did a thorough anal and distal rectal examination finding no abnormality of the distal rectal mucosa.  There was a lot of scar tissue from previous hemorrhoidectomy.  There was no spasm of the internal sphincter.  I found a chronic granulated linear ulceration in the left posterolateral position.  I totally excised it and closed the defect with running 3-0 chromic suture. Hemostasis was good.  I applied a light bandage.  She tolerated the operation well.  I sent it as a specimen for histology. DD:  07/08/00 TD:  07/08/00 Job: [id number]

## 2011-01-31 NOTE — Op Note (Signed)
[place]. [facility]  Patient:    Heather Gillespie, Heather Gillespie                     MRN: [id number] Proc. Date: 10/29/99 Adm. Date:  [id number] Attending:  Meredith Leeds                           Operative Report  PREOPERATIVE DIAGNOSIS:  Inflamed and ulcerated hemorrhoids.  POSTOPERATIVE DIAGNOSIS:  Inflamed and ulcerated hemorrhoids.  OPERATION:  Hemorrhoidectomy.  SURGEON:  Zigmund Daniel, M.D.  ANESTHESIA:  General.  DESCRIPTION OF PROCEDURE:  After the patient was adequately monitored and anesthetized, and after a routine preparation and draping of the perianal area, I did a careful intra-anal examination.  I saw no abnormality of the distal rectal mucosa.  There was chronic scarring of the anal canal, because of previous operations.  In the left lateral position, there was a large redundant mostly external, but actually compound hemorrhoid, which had two ulcerated areas. Almost in the posterior midline there was another smaller hemorrhoid with an ulcer in t as well.  Both of the ulcerated areas appeared very chronic in nature.  The anal canal felt slightly tight.  I could not tell whether it was from chronic scar or from hypertrophy of the internal sphincter.  I thoroughly anesthetized the anal  canal with 0.5% bupivacaine with epinephrine.  I made an elliptical small incision around the posterior ulcerated area and found that it was scarred down to the internal sphincter, and I carefully separated it from the internal sphincter, without cutting it at that location.  I then closed the defect longitudinally with a running #3-0 chromic after getting good hemostasis with the cautery.  I similarly elliptically excised the left lateral area.  At that point underneath the excision, I did an internal sphincterotomy, cutting the hypertrophic portion of the internal sphincter.  I took care not to cut the external sphincter.  I then got  good hemostasis and closed that defect with a running #3-0 chromic placed longitudinally so as not to narrow the anal canal.  Both specimens were sent to the laboratory for histology.  I checked and saw that there was no stricture of the anal canal following the procedure.  The patient was awakened after the application of a bandage. DD:  10/29/99 TD:  10/29/99 Job: [id number]

## 2011-01-31 NOTE — Op Note (Signed)
NAMEJHAYLA, Heather Gillespie                      ACCOUNT NO.:  [id number]   MEDICAL RECORD NO.:  [id number]                   PATIENT TYPE:  AMB   LOCATION:  DSC                                  FACILITY:  [facility]   PHYSICIAN:  Lorre Munroe., M.D.            DATE OF BIRTH:  02-Dec-1960   DATE OF PROCEDURE:  02/06/2004  DATE OF DISCHARGE:                                 OPERATIVE REPORT   PREOPERATIVE DIAGNOSIS:  Recurrent anal fissures.   POSTOPERATIVE DIAGNOSIS:  Recurrent anal fissures.   OPERATION PERFORMED:  Excision of anal fissures.   SURGEON:  Lebron Conners, M.D.   ANESTHESIA:  General.   INDICATIONS FOR PROCEDURE:  This is a 50 year old white female whom I have  treated for years for recurring anal fissures of uncertain etiology, suspect  it to be factitious but never admitted by the patient.  She presented again  very recently with anemia and she stated profuse rectal bleeding and pain.  I found her in the office to have recurring fissures.  The patient has  chronic diarrhea which has been intractable and treated by many  gastroenterologists.  She usually gets at least several months relief by  excision of the fissures.   DESCRIPTION OF PROCEDURE:  After the patient was monitored and anesthetized  and had routine preparation and draping of the perianal area with her in the  dorsal lithotomy position, I thoroughly examined the perianal skin and found  no abnormalities.  There were two fissures noted, one on the left side  somewhat posterior to the sagittal plane and one in the posterior midline.  They appeared to be very deep but base was made up of the internal sphincter  and tissues around it somewhat swollen, making a deep chronic appearance.  The distal rectal mucosa had a normal appearance as it has in the past.   I first excised the fissure on the left side, making cut through the intact  anoderm just to either side and excising the fissure base taking care not  to  take out very much muscle.  Before doing excision of the fissures, I  thoroughly anesthetized the anal canal with long acting local anesthetic.  After excising the fissure and getting good hemostasis with cautery, I  closed it in the direction of the incision with running 20 chromic suture.  I treated the posterior midline fissure in the same way and then very  carefully examined her to be sure that there were no further fissures  present.  Hemostasis was good.  The patient was stable through the  procedure.  After application of a small bandage, she awakened and was taken  to PACU in good condition.  Lorre Munroe., M.D.    Jodi Marble  D:  02/06/2004  T:  02/06/2004  Job:  [id number]

## 2011-01-31 NOTE — Op Note (Signed)
   Heather Gillespie, Heather Gillespie                      ACCOUNT NO.:  [id number]   MEDICAL RECORD NO.:  [id number]                   PATIENT TYPE:  AMB   LOCATION:  DAY                                  FACILITY:  [facility]   PHYSICIAN:  Lorre Munroe., M.D.            DATE OF BIRTH:  27-Oct-1960   DATE OF PROCEDURE:  11/29/2002  DATE OF DISCHARGE:                                 OPERATIVE REPORT   PREOPERATIVE DIAGNOSES:  Multiple anal fissures with bleeding.   POSTOPERATIVE DIAGNOSES:  Multiple anal fissures with bleeding.   OPERATION:  Excision of anal fissures.   SURGEON:  Lebron Conners, M.D.   ANESTHESIA:  General.   DESCRIPTION OF PROCEDURE:  After the patient was monitored and anesthetized  and had routine preparation and draping of the perineum, I carefully  examined the distal rectum and anal canal. I saw chronic appearing fissures  in the posterior midline and one anterolaterally on the left. The one  anterolaterally on the left was quite deep and the one posterior midline was  fairly superficial. This had the same appearance as many fissures the  patient has had in the past. The anal canal was somewhat scarred due to  previous surgery but was not tight, easily admitted two fingers. There did  not seem to be any spasm of the internal sphincter although it was scarred  as well. I liberally infused local anesthetic in the anal and perianal  region. I then elliptically excised both fissures and got hemostasis with  the cautery. On the lateral fissure, I did a limited internal sphincterotomy  to relax tension even further. I closed both fissures with running 3-0  Chromic suture. Hemostasis was good. The patient tolerated the procedure  well.                                               Lorre Munroe., M.D.    WB/MEDQ  D:  11/29/2002  T:  11/29/2002  Job:  [id number]   cc:   Everardo All. Madilyn Fireman, M.D.  [address], [address]  [place]  [place] [address]  Fax: [phone]

## 2011-01-31 NOTE — Op Note (Signed)
Heather Gillespie, Heather Gillespie            ACCOUNT NO.:  [id number]   MEDICAL RECORD NO.:  [id number]          PATIENT TYPE:  AMB   LOCATION:  DAY                          FACILITY:  [facility]   PHYSICIAN:  Lorre Munroe., M.D.DATE OF BIRTH:  Apr 10, 1961   DATE OF PROCEDURE:  01/27/2005  DATE OF DISCHARGE:                                 OPERATIVE REPORT   PREOPERATIVE DIAGNOSES:  Multiple anal fissures.   POSTOPERATIVE DIAGNOSES:  Multiple anal fissures.   OPERATION:  Excision of anal fissures.   SURGEON:  Lebron Conners, M.D.   ANESTHESIA:  General and local.   DESCRIPTION OF PROCEDURE:  After the patient was monitored and anesthetized  and had routine preparation and draping of the perineum, I examined the anal  canal and found an anal fissure on the left posterior area and one almost in  the midline posteriorly. It was slightly to the right. They were separated  by a bridge of pretty normal-appearing although scarred anoderm. The patient  has a history of many anal fissures in the past and suffers from chronic  diarrhea exacerbating the problem. She had had good sphincterotomy done in  the past and muscles were quite relaxed. I liberally infiltrated local  anesthetic in the fissures and then all of the perianal skin and anoderm. I  elliptically excised the two fissures going right down to the internal  sphincter muscles but not taking any of the muscles getting hemostasis with  the cautery. I closed each wound with running 2-0 chromic. Hemostasis was  good. The anoderm was then smooth and showed no sign of any other fissures.  The patient tolerated the operation well.      WB/MEDQ  D:  01/27/2005  T:  01/27/2005  Job:  [id number]

## 2011-01-31 NOTE — Op Note (Signed)
NAMELYRAH, BRADT            ACCOUNT NO.:  [id number]   MEDICAL RECORD NO.:  [id number]          PATIENT TYPE:  AMB   LOCATION:  DAY                          FACILITY:  [facility]   PHYSICIAN:  Lebron Conners, M.D.   DATE OF BIRTH:  1961/04/05   DATE OF PROCEDURE:  09/17/2005  DATE OF DISCHARGE:                                 OPERATIVE REPORT   PREOPERATIVE DIAGNOSIS:  Recurrent anal fissure.   POSTOPERATIVE DIAGNOSIS:  Recurrent anal fissure.   OPERATION:  Excision of anal fissures.   SURGEON:  Lebron Conners, M.D.   ANESTHESIA:  General and local.   DESCRIPTION OF PROCEDURE:  After the patient was monitored and anesthetized  and had routine preparation and draping of perineum, I liberally infused  local anesthetic in the anal region. I examined the area thoroughly and  found no evidence of stenosis. There were two large deep fissures  posteriorly and slightly to the left side. They were atypical in appearance  as I had seen this patient in the past. I found no other abnormalities  except for a great deal of scar tissue from previous operations. Since these  were fairly close together, I elected to excise the intervening tissue,  freshen the edges and cauterize the base and then closed this with a  transverse closure rather than excising each fissure individually. I did  this and mobilized the tissue superficial to the sphincter musculature and  closed the defect with 2-0 chromic stitch. This did not narrow the anal  canal. Hemostasis was in good condition.      Lebron Conners, M.D.  Electronically Signed     WB/MEDQ  D:  09/17/2005  T:  09/17/2005  Job:  [id number]

## 2011-01-31 NOTE — Op Note (Signed)
[facility]  Patient:    Heather Gillespie, Heather Gillespie Visit Number: [id number] MRN: [id number]          Service Type: DSU Location: DAY Attending Physician:  Meredith Leeds Proc. Date: 03/28/02 Admit Date:  03/28/2002 Discharge Date: 03/28/2002                             Operative Report  PREOPERATIVE DIAGNOSIS:  Anal fissure with anemia and chronic diarrhea.  POSTOPERATIVE DIAGNOSIS:  Anal fissure with anemia and chronic diarrhea.  PROCEDURE PERFORMED:  Proctoscopy, excision of anal fissures, and lateral internal anal sphincterotomy.  SURGEON:  Zigmund Daniel, M.D.  ANESTHESIA:  General.  DESCRIPTION OF PROCEDURE:  After the patient was given general anesthesia, well monitored, and had routine preparation and draping of the perineum, I performed proctoscopy to 25 cm.  The distal rectal mucosa looked totally normal right now to the anal verge.  No bleeding sources were noted and no blood was noted above the anal area.  I then dilated the anus slightly with two fingers and then an anoscopic examination discovering chronic deep appearing fissures anteriorly and posteriorly, both in the midline.  Slight hypertrophy of the skin just distal to the fissure was present at each one. Because of the large amount of bleeding which had been taking place, I felt that the fissures should be excised.  I anesthetized the anal area completely and then worked first posteriorly and elliptically excised the fissure right down to the internal sphincter but not including the internal sphincter and I closed the defect with running 3-0 chromic.  I treated the anterior fissure identically.  I then felt the anal canal and felt that with the scar tissue, it was slightly tight, although the bullet retractor could be put in.  It felt as though the internal sphincter might have become slightly hypertrophic, so I made a .5 cm incision on the left lateral anal wall just over  the distal part of the internal sphincter and reached in with a scalpel and cut the hypertrophic portion.  That seemed to relax things nicely.  I closed that incision with a single 2-0 chromic stitch.  Hemostasis was good.  I applied a bandage and concluded the operation.  The patient tolerated it well. Attending Physician:  Meredith Leeds DD:  03/28/02 TD:  03/31/02 Job: [id number]

## 2011-01-31 NOTE — Op Note (Signed)
[place]. [facility]  Patient:    Heather Gillespie, Heather Gillespie Visit Number: [id number] MRN: [id number]          Service Type: DSU Location: [facility] Attending Physician:  Meredith Leeds Dictated by:   Zigmund Daniel, M.D. Proc. Date: 02/16/02 Admit Date:  02/16/2002 Discharge Date: 02/16/2002                             Operative Report  PREOPERATIVE DIAGNOSIS:  Anal fissure.  POSTOPERATIVE DIAGNOSIS:  Anal fissure.  OPERATION PERFORMED:  Examination under anesthesia and excision of anal fissures.  SURGEON:  Zigmund Daniel, M.D.  ANESTHESIA:  General.  PROCEDURE:  After the patient was monitored and anesthetized and had routine preparation and draping of the perineum, I used a sterile proctoscope and examined the lower rectum. The mucosa appeared normal.  I found some tissue at about 12 cm from the anal verge, which appeared to be mucosa separated from the rectum.  There was no ulceration present at that point. I removed a piece of that tissue and sent it as a specimen.  As I came on down, I saw no active bleeding and no inflammation of the rectal mucosa.  There were two deep, chronic-appearing, partially granulated longitudinal fissures in the anal canal.  One was in the posterior midline, and one was slightly eccentric, toward the left side anteriorly.  I excised both of them elliptically and closed the defects with 3-0 chromic suture after getting good hemostasis.  The pain itself was almost patulous.  The internal sphincter did not seem to be patulous.  The internal sphincter did not seem hypertrophic or spastic, and so I did not perform an internal sphincterotomy.  I looked carefully for further evidence of bleeding or possible lesions and did not find any. I concluded the procedure after applying a small bandage, and the patient tolerated the operation well. Dictated by:   Zigmund Daniel, M.D. Attending Physician:  Meredith Leeds DD:  02/16/02 TD:  02/17/02 Job: [id number]

## 2011-01-31 NOTE — Op Note (Signed)
NAMEGRACELYN, COVENTRY            ACCOUNT NO.:  [id number]   MEDICAL RECORD NO.:  [id number]          PATIENT TYPE:  AMB   LOCATION:  DAY                          FACILITY:  [facility]   PHYSICIAN:  Lebron Conners, M.D.   DATE OF BIRTH:  [birth year]   DATE OF PROCEDURE:  09/28/2006  DATE OF DISCHARGE:                               OPERATIVE REPORT   PRE-AND-POSTOPERATIVE DIAGNOSIS:  Recurrent anal fissure.   OPERATION:  Excision of anal fissure.   SURGEON:  Lebron Conners, M.D.   ANESTHESIA:  General and local.   BLOOD LOSS:  Minimal.   COMPLICATIONS:  None.   CONDITION:  To PACU good.   PROCEDURE:  After the patient was monitored and asleep, I placed her in  the lithotomy position, and prepped the anorectal area, and thoroughly  examined it.  I found to fissures which were deep fairly acute in  appearance with some blood clot present in the posterior area of the  anal canal.  The anal sphincter was very lax.  I observed the distal  rectal mucosa; and the remainder of the anal canal, and found no  additional abnormalities.   I thoroughly anesthetized the operative site with long-acting local  anesthetic.  I elliptically excised the two fissures together; and sewed  up the incision with 2-0 chromic; closing it in a transverse fashion, so  as to advance the anoderm somewhat distally, and avoid any tendency  toward anal stenosis.  I then examined the area, again, and saw that I  had not created any stenosis; and I had not left any bleeding.  The  patient was awakened and sent to PACU after application of a small  bandage.      Lebron Conners, M.D.  Electronically Signed     WB/MEDQ  D:  09/28/2006  T:  09/28/2006  Job:  [id number]

## 2011-01-31 NOTE — Op Note (Signed)
Heather Gillespie, Heather Gillespie            ACCOUNT NO.:  [id number]   MEDICAL RECORD NO.:  [id number]          PATIENT TYPE:  AMB   LOCATION:  DAY                          FACILITY:  [facility]   PHYSICIAN:  Lebron Conners, M.D.   DATE OF BIRTH:  05-20-1961   DATE OF PROCEDURE:  01/16/2006  DATE OF DISCHARGE:                                 OPERATIVE REPORT   PREOPERATIVE DIAGNOSIS:  Anal ulcer or fissure.   POSTOPERATIVE DIAGNOSIS:  Anal ulcer or fissure.   OPERATION:  Excision of anal ulcer.   SURGEON:  Lebron Conners, M.D.   ANESTHESIA:  General and local.   SPECIMEN:  Anal lesion.   BLOOD LOSS:  Minimal.   COMPLICATIONS:  None.   DISPOSITION:  The patient to PACU in good condition.   PROCEDURE:  After the patient was monitored and asleep, and had routine  preparation and draping of the perianal region, I liberally infused local  anesthetic in the perianal region particularly posterior at the site of an  identified ulcer.  I examined the distal rectum and entire anal canal and  saw no lesions except for the one lesion.  It had the typical appearance of  a chronic very complex fissure or ulceration, perhaps traumatic in origin,  but with nothing that looked malignant.  This is a typical appearance of  this recurrent lesion in this patient.  There was slight friability.  I used  a Bovie to elliptically excise the ulcer and cauterize remaining granulation  tissue on the internal sphincter musculature.  I then closed the defect  transversely with 2-0 chromic suture and achieved a good tension free  closure.  There was no eversion of rectal mucosa.  Hemostasis was good.  I  added some more local anesthetic and concluded the procedure.  I sent the  ulcer for histologic examination.      Lebron Conners, M.D.  Electronically Signed     WB/MEDQ  D:  01/16/2006  T:  01/16/2006  Job:  [id number]

## 2011-01-31 NOTE — Op Note (Signed)
[facility]  Patient:    Heather Gillespie, Heather Gillespie Visit Number: [id number] MRN: [id number]          Service Type: DSU Location: DAY Attending Physician:  Meredith Leeds Dictated by:   Zigmund Daniel, M.D. Proc. 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  OPERATION PERFORMED: 1. External hemorrhoidectomy. 2. Excision of anal fissure. 3. Cautery of anal ulcers.  SURGEON:  Zigmund Daniel, M.D.  ANESTHESIA:  General.  DESCRIPTION OF PROCEDURE:  After adequate monitoring and general anesthesia and routine preparation and draping of the perineum I thoroughly examined the anal area.  I found three ulcerations, the most prominent one being anterior, another one posterior.  Both of those were linear and the one on the left lateral anal wall was sort of round and small.  All were superficial.  All appeared chronic with some granulation tissue and bled easily.  The sphincter muscles were not tight.  Distal rectal mucosa looked normal.  There was also external skin tag which was friable and had evidence of recent bleeding.  I excised that skin tag by clamping under it and undersewing it with 3-0 chromic and excising it.  I sent it as a specimen.  I excised the anterior ulcer elliptically and closed that with a running 3-0 chromic stitch.  I cauterized the other ulcerations.  I consulted with Dr. Earlene Plater intraoperatively and he felt that the procedure was appropriate.  No further excisions or sphincterotomy or other biopsies or procedures were indicated.  The patient tolerated the operation well. Dictated by:   Zigmund Daniel, M.D. Attending Physician:  Meredith Leeds DD:  11/30/01 TD:  11/30/01 Job:  [id number]

## 2011-01-31 NOTE — Op Note (Signed)
Heather Gillespie, Heather Gillespie            ACCOUNT NO.:  [id number]   MEDICAL RECORD NO.:  [id number]          PATIENT TYPE:  AMB   LOCATION:  ENDO                         FACILITY:  [facility]   PHYSICIAN:  John C. Madilyn Fireman, M.D.    DATE OF BIRTH:  June 22, 1961   DATE OF PROCEDURE:  03/19/2006  DATE OF DISCHARGE:                                 OPERATIVE REPORT   INDICATION FOR PROCEDURE:  Reported history of adenomatous colon polyps six  years ago.   PROCEDURE:  The patient was placed in the left lateral decubitus position  and placed on the pulse monitor with continuous low-flow oxygen delivered by  nasal cannula.  He was sedated with 100 mcg IV fentanyl and 10 mg IV Versed.  The Olympus video colonoscope was inserted into the rectum and advanced to  the cecum, confirmed by transillumination at McBurney's point and  visualization of the ileocecal valve and appendiceal orifice.  The prep was  excellent.  The cecum, ascending, transverse, descending, and sigmoid colon  all appeared normal with no masses, polyps, diverticula, or other mucosal  abnormalities.  The rectum likewise appeared normal.  On retroflexed view,  the anus revealed no obvious internal hemorrhoids but did show some fibrosis  consistent with previous surgery.  Scope was then withdrawn and the patient  returned to the recovery room in stable condition.  She tolerated the  procedure well and there were no immediate complications.   IMPRESSION:  Normal colonoscopy.   PLAN:  Consider repeat study in five years.           ______________________________  Everardo All Madilyn Fireman, M.D.     JCH/MEDQ  D:  03/19/2006  T:  03/19/2006  Job:  [id number]   cc:   Lebron Conners, M.D.  [address], [address]  [place]  [place] [address]

## 2011-01-31 NOTE — Op Note (Signed)
   [name], Heather Gillespie                      ACCOUNT NO.:  [id number]   MEDICAL RECORD NO.:  [id number]                   PATIENT TYPE:  OIB   LOCATION:  2899                                 FACILITY:  [facility]   PHYSICIAN:  Lorre Munroe., M.D.            DATE OF BIRTH:  Feb 05, 1961   DATE OF PROCEDURE:  02/24/2003  DATE OF DISCHARGE:  02/24/2003                                 OPERATIVE REPORT   PREOPERATIVE DIAGNOSIS:  Recurrent anal fissures.   POSTOPERATIVE DIAGNOSIS:  Recurrent anal fissures.   PROCEDURE:  Excision of anal fissures and lateral internal anal  sphincterotomy.   SURGEON:  Lebron Conners, M.D.   ANESTHESIA:  General.   DESCRIPTION OF PROCEDURE:  After the patient was monitored and anesthetized  and had routine preparation and draping of the anus and perianal region,  liberally infused longacting local anesthetic in the perianal tissues.  I  then examined the anal canal and distal rectum. The distal rectal mucosa was  normal in appearance. The anal canal was very scarred due to previous  surgery. However, it was not strictured.  There was a fairly superficial  granulated fissure in the posterior midline.  There was a larger, deeper  fissure on the left lateral aspect of the anal canal.  No other  abnormalities were detected.  I first excised the posterior fissure sharply,  I undermined the anoderm slightly and sutured it with 2-0 chromic.  I  similarly excised the lateral fissure, but I found a good deal of scar  tissue there and some evident hypertrophy of the distal part of the internal  sphincter.  I cut that directly under the fissure and then undermined the  anoderm and sutured it with 3-0 chromic.  I felt that this closed the anal  canal nicely without any stricture.  The patient tolerated the procedure  well.                                               Lorre Munroe., M.D.    WB/MEDQ  D:  02/24/2003  T:  02/25/2003  Job:  [id number]   cc:    Everardo All. Madilyn Fireman, M.D.  [address]  [place]  [place] [address]  Fax: [phone]

## 2011-01-31 NOTE — Op Note (Signed)
[facility]  Patient:    Heather Gillespie, Heather Gillespie                     MRN: [id number] Proc. Date: 01/19/01 Adm. Date:  [id number] Attending:  Meredith Leeds                           Operative Report  PREOPERATIVE DIAGNOSIS:  Anal ulcer with bleeding.  POSTOPERATIVE DIAGNOSIS:  Anal ulcer with bleeding.  OPERATION:  Excision of anal ulcer.  SURGEON:  Zigmund Daniel, M.D.  ANESTHESIA:  General.  DESCRIPTION OF PROCEDURE:  After the patient was adequately monitored and anesthetized, and after routine preparation and draping of the perineum, I examined the anal canal and found an ulcer on the left posterolateral region, as I had seen before.  It appeared to be fairly superficial, and there was a lot of chronic scarring.  I did not see any abnormality of the distal rectal mucosa or any other source of bleeding.  No masses palpable.  I infiltrated with 0.5% Marcaine with epinephrine around the ulcer and then elliptically excised and got hemostasis with the cautery and closed the defect with running 2-0 chromic.  She tolerated the operation well. DD:  01/19/01 TD:  01/19/01 Job: [id number]

## 2011-03-31 ENCOUNTER — Encounter (INDEPENDENT_AMBULATORY_CARE_PROVIDER_SITE_OTHER): Payer: Self-pay | Admitting: General Surgery

## 2011-03-31 ENCOUNTER — Ambulatory Visit (INDEPENDENT_AMBULATORY_CARE_PROVIDER_SITE_OTHER): Payer: Medicaid Other | Admitting: General Surgery

## 2011-03-31 VITALS — BP 132/88 | HR 80 | Temp 96.6°F | Ht 62.5 in | Wt 133.4 lb

## 2011-03-31 DIAGNOSIS — K6289 Other specified diseases of anus and rectum: Secondary | ICD-10-CM | POA: Insufficient documentation

## 2011-03-31 NOTE — Patient Instructions (Signed)
Need to see a colorectal specialist. Refer to Dr. Toni Arthurs

## 2011-04-02 ENCOUNTER — Other Ambulatory Visit: Payer: Self-pay | Admitting: Gastroenterology

## 2011-04-02 DIAGNOSIS — R197 Diarrhea, unspecified: Secondary | ICD-10-CM

## 2011-04-02 NOTE — Progress Notes (Signed)
Subjective:     Patient ID: Minerva Ends, female   DOB: 03/17/1961, 50 y.o.   MRN: [id number]  HPI The patient is a 50 year old white female who presents with rectal pain for about the last 9 months or so. She has apparently had lots of rectal procedures performed by Dr. Orson Slick in the past. Her main complaint today is of diarrhea and rectal pain. She has had some bleeding associated with her bowel movements. She has been evaluated by Dr. Audley Hose with colonoscopy and he felt that she may have some hemorrhoid issues. The patient states she's had issues with rectal ulcers. Unfortunately I do not have any procedures or pathology that can provide any more insight into this. She also notes some epigastric soreness. She denies any fevers or chills or chest pain or shortness of breath.  Review of Systems  Constitutional: Negative.   HENT: Negative.   Eyes: Negative.   Respiratory: Negative.   Cardiovascular: Negative.   Gastrointestinal: Positive for abdominal pain, diarrhea, blood in stool and rectal pain.  Genitourinary: Negative.   Musculoskeletal: Negative.   Skin: Negative.   Neurological: Negative.   Hematological: Negative.   Psychiatric/Behavioral: Positive for behavioral problems.       Objective:   Physical Exam  Constitutional: She is oriented to person, place, and time. She appears well-developed and well-nourished.  HENT:  Head: Normocephalic and atraumatic.  Eyes: Conjunctivae and EOM are normal. Pupils are equal, round, and reactive to light.  Neck: Normal range of motion. Neck supple.  Cardiovascular: Normal rate and regular rhythm.   Pulmonary/Chest: Effort normal and breath sounds normal.  Abdominal: Soft. Bowel sounds are normal. There is tenderness.  Genitourinary:       On rectal exam she appears to have mild external hemorrhoidal disease. She has poor rectal tone. She has no palpable mass. On anoscopic exam she has mild internal hemorrhoidal disease but nothing that  appears to be the source of bleeding.  Musculoskeletal: Normal range of motion.  Neurological: She is alert and oriented to person, place, and time.  Skin: Skin is warm and dry.  Psychiatric:       The patient at times seems agitated with my recommendations.       Assessment:     This is a 50 year old black female who has some pretty extensive rectal history with abnormal ulcerations that I cannot explain. I see nothing today that by my examination would require surgery.    Plan:     At this point her history and current findings are difficult to explain. Because of this I feel it might be better for her to see a colorectal specialist. I have recommended that she see Dr. Charlena Cross who might be able to see her as nearby as [place] is [place] few miles away. The patient states that she is unable to travel more than just a couple miles from home and declines any referral at this point.

## 2011-04-03 ENCOUNTER — Ambulatory Visit
Admission: RE | Admit: 2011-04-03 | Discharge: 2011-04-03 | Disposition: A | Discharge status: Home / Self Care | Payer: PRIVATE HEALTH INSURANCE | Source: Ambulatory Visit | Attending: Gastroenterology | Admitting: Gastroenterology

## 2011-04-03 DIAGNOSIS — R197 Diarrhea, unspecified: Secondary | ICD-10-CM

## 2011-04-03 MED ORDER — IOHEXOL 300 MG/ML  SOLN
100.0000 mL | Freq: Once | INTRAMUSCULAR | Status: AC | PRN
  Administered 2011-04-03: 100 mL via INTRAVENOUS

## 2011-05-20 ENCOUNTER — Other Ambulatory Visit (HOSPITAL_COMMUNITY): Payer: Self-pay | Admitting: Family Medicine

## 2011-05-20 DIAGNOSIS — Z1231 Encounter for screening mammogram for malignant neoplasm of breast: Secondary | ICD-10-CM

## 2011-06-09 ENCOUNTER — Ambulatory Visit (HOSPITAL_COMMUNITY)
Admission: RE | Admit: 2011-06-09 | Discharge: 2011-06-09 | Disposition: A | Discharge status: Home / Self Care | Payer: PRIVATE HEALTH INSURANCE | Source: Ambulatory Visit | Attending: Family Medicine | Admitting: Family Medicine

## 2011-06-09 DIAGNOSIS — Z1231 Encounter for screening mammogram for malignant neoplasm of breast: Secondary | ICD-10-CM | POA: Insufficient documentation

## 2011-07-19 ENCOUNTER — Inpatient Hospital Stay (INDEPENDENT_AMBULATORY_CARE_PROVIDER_SITE_OTHER)
Admission: RE | Admit: 2011-07-19 | Discharge: 2011-07-19 | Disposition: A | Discharge status: Home / Self Care | Payer: Medicaid Other | Source: Ambulatory Visit | Attending: Family Medicine | Admitting: Family Medicine

## 2011-07-19 ENCOUNTER — Ambulatory Visit (INDEPENDENT_AMBULATORY_CARE_PROVIDER_SITE_OTHER): Payer: Medicaid Other

## 2011-07-19 DIAGNOSIS — X58XXXA Exposure to other specified factors, initial encounter: Secondary | ICD-10-CM

## 2011-07-19 DIAGNOSIS — S62639A Displaced fracture of distal phalanx of unspecified finger, initial encounter for closed fracture: Secondary | ICD-10-CM

## 2012-05-03 ENCOUNTER — Other Ambulatory Visit: Payer: Self-pay | Admitting: Family Medicine

## 2012-05-03 DIAGNOSIS — Z1231 Encounter for screening mammogram for malignant neoplasm of breast: Secondary | ICD-10-CM

## 2012-05-27 ENCOUNTER — Other Ambulatory Visit: Payer: Self-pay | Admitting: Family Medicine

## 2012-05-27 DIAGNOSIS — Z780 Asymptomatic menopausal state: Secondary | ICD-10-CM

## 2012-05-27 DIAGNOSIS — Z1231 Encounter for screening mammogram for malignant neoplasm of breast: Secondary | ICD-10-CM

## 2012-06-10 ENCOUNTER — Ambulatory Visit (HOSPITAL_COMMUNITY)
Admission: RE | Admit: 2012-06-10 | Discharge: 2012-06-10 | Disposition: A | Discharge status: Home / Self Care | Payer: PRIVATE HEALTH INSURANCE | Source: Ambulatory Visit | Attending: Family Medicine | Admitting: Family Medicine

## 2012-06-10 DIAGNOSIS — Z1231 Encounter for screening mammogram for malignant neoplasm of breast: Secondary | ICD-10-CM

## 2012-06-10 DIAGNOSIS — Z780 Asymptomatic menopausal state: Secondary | ICD-10-CM

## 2012-11-13 ENCOUNTER — Encounter: Payer: Self-pay | Admitting: Family Medicine

## 2012-11-13 DIAGNOSIS — E559 Vitamin D deficiency, unspecified: Secondary | ICD-10-CM | POA: Insufficient documentation

## 2012-11-13 DIAGNOSIS — M810 Age-related osteoporosis without current pathological fracture: Secondary | ICD-10-CM | POA: Insufficient documentation

## 2012-11-13 DIAGNOSIS — S0300XA Dislocation of jaw, unspecified side, initial encounter: Secondary | ICD-10-CM | POA: Insufficient documentation

## 2012-11-27 ENCOUNTER — Encounter (HOSPITAL_COMMUNITY): Payer: Self-pay | Admitting: Emergency Medicine

## 2012-11-27 ENCOUNTER — Emergency Department (INDEPENDENT_AMBULATORY_CARE_PROVIDER_SITE_OTHER)
Admission: EM | Admit: 2012-11-27 | Discharge: 2012-11-27 | Disposition: A | Discharge status: Home / Self Care | Payer: Medicaid Other | Source: Home / Self Care | Attending: Emergency Medicine | Admitting: Emergency Medicine

## 2012-11-27 DIAGNOSIS — M5432 Sciatica, left side: Secondary | ICD-10-CM

## 2012-11-27 DIAGNOSIS — J302 Other seasonal allergic rhinitis: Secondary | ICD-10-CM

## 2012-11-27 DIAGNOSIS — J309 Allergic rhinitis, unspecified: Secondary | ICD-10-CM

## 2012-11-27 DIAGNOSIS — M5430 Sciatica, unspecified side: Secondary | ICD-10-CM

## 2012-11-27 MED ORDER — IBUPROFEN 800 MG PO TABS
800.0000 mg | ORAL_TABLET | Freq: Once | ORAL | Status: AC
  Administered 2012-11-27: 800 mg via ORAL

## 2012-11-27 MED ORDER — PREDNISONE 20 MG PO TABS
ORAL_TABLET | ORAL | Status: AC
  Filled 2012-11-27: qty 3

## 2012-11-27 MED ORDER — PREDNISONE 20 MG PO TABS
60.0000 mg | ORAL_TABLET | Freq: Once | ORAL | Status: AC
  Administered 2012-11-27: 60 mg via ORAL

## 2012-11-27 MED ORDER — CYCLOBENZAPRINE HCL 10 MG PO TABS: 10.0000 mg | ORAL_TABLET | Freq: Three times a day (TID) | ORAL | Status: DC | PRN

## 2012-11-27 MED ORDER — HYDROCODONE-ACETAMINOPHEN 5-325 MG PO TABS: 1.0000 | ORAL_TABLET | ORAL | Status: DC | PRN

## 2012-11-27 MED ORDER — IBUPROFEN 800 MG PO TABS
ORAL_TABLET | ORAL | Status: AC
  Filled 2012-11-27: qty 1

## 2012-11-27 MED ORDER — PREDNISONE 20 MG PO TABS: 40.0000 mg | ORAL_TABLET | Freq: Every day | ORAL | Status: DC

## 2012-11-27 NOTE — ED Provider Notes (Signed)
History     CSN: [id number]  Arrival date & time 11/27/12  1101   First MD Initiated Contact with Patient 11/27/12 1129      Chief Complaint  Patient presents with  . Back Pain    lower back center. hx pinched nerve     Patient is a 52 y.o. female presenting with back pain. The history is provided by the patient.  Back Pain Location:  Lumbar spine and gluteal region Quality:  Aching and shooting Radiates to:  L knee and L posterior upper leg Pain severity:  Moderate Pain is:  Same all the time Onset quality:  Gradual Duration:  1 month Timing:  Constant Progression:  Worsening Chronicity:  New Context: recent injury   Relieved by:  NSAIDs Worsened by:  Bending and movement Ineffective treatments:  NSAIDs Associated symptoms: leg pain   Associated symptoms: no bladder incontinence, no bowel incontinence, no dysuria, no fever, no numbness, no paresthesias, no perianal numbness, no tingling and no weakness   Risk factors: hx of osteoporosis   Pt states that in early February she bent over suddenly to tie her shoe and flet a sharp pain and "pulling" sensation in her (L) lower back. Since that time she has developed pain that radiates into her (L) gluteal area and down the psoterior aspect of her left (L) to her (L) lateral knee. She saw her PCP on 10/27/12 and was told she had a "pinched nerve". She was given medication for pain but her symptoms have persisted. She has never had this problem before but does admit to h/o osteoporosis. She si supposed to take Fosamax but is "afraid" to do so because of the side effects. Denies weakness, numbness or tingling to the LLE and no loss of bowel or bladder. Pt also states she has had trouble with irriattion to her throat. States she was treated on 10/27/12 for tonsilitis during the same above visit w/ PCP but throat irritation persist. Endorses that she has increased PND and h/o seasonal allergies. Denies fevers or other associated symptoms.    Past Medical History  Diagnosis Date  . Glaucoma   . GERD (gastroesophageal reflux disease)   . Hemorrhoids   . IBS (irritable bowel syndrome)   . Hiatal hernia   . Abdominal pain   . Sinus problem   . N&V (nausea and vomiting)   . Rectal bleeding   . Rectal ulcer   . Allergy   . Vitamin D deficiency   . TMJ (dislocation of temporomandibular joint)   . Osteoporosis     Past Surgical History  Procedure Laterality Date  . Colonoscopy w/ polypectomy      colon ulcers  . Cholecystectomy  1984  . Hemorroidectomy    . Anal fissure repair    . Eye surgery      Family History  Problem Relation Age of Onset  . Diabetes Mother   . Stroke Father     History  Substance Use Topics  . Smoking status: Never Smoker   . Smokeless tobacco: Never Used  . Alcohol Use: No    OB History   Grav Para Term Preterm Abortions TAB SAB Ect Mult Living                  Review of Systems  Constitutional: Negative for fever.  Gastrointestinal: Negative for bowel incontinence.  Genitourinary: Negative for bladder incontinence and dysuria.  Musculoskeletal: Positive for back pain.  Neurological: Negative for tingling, weakness, numbness and  paresthesias.  All other systems reviewed and are negative.    Allergies  Hydroxyzine pamoate  Home Medications   Current Outpatient Rx  Name  Route  Sig  Dispense  Refill  . alendronate (FOSAMAX) 70 MG tablet   Oral   Take 70 mg by mouth every 7 (seven) days. Take with a full glass of water on an empty stomach.         . calcium citrate-vitamin D (CITRACAL+D) 315-200 MG-UNIT per tablet   Oral   Take 1 tablet by mouth 2 (two) times daily.         . cholecalciferol (VITAMIN D) 1000 UNITS tablet   Oral   Take 2,000 Units by mouth daily.         . cholestyramine (QUESTRAN) 4 GM/DOSE powder   Oral   Take by mouth 3 (three) times daily with meals.           Marland Kitchen dexlansoprazole (DEXILANT) 60 MG capsule   Oral   Take 60 mg by  mouth daily.         . Olopatadine HCl (PATADAY) 0.2 % SOLN   Ophthalmic   Apply to eye daily.           Marland Kitchen omeprazole (PRILOSEC) 40 MG capsule   Oral   Take 40 mg by mouth daily.           . cyclobenzaprine (FLEXERIL) 10 MG tablet   Oral   Take 10 mg by mouth Nightly.           . fluticasone (FLONASE) 50 MCG/ACT nasal spray   Nasal   Place 2 sprays into the nose daily.         Marland Kitchen HYDROcodone-acetaminophen (NORCO) 5-325 MG per tablet   Oral   Take 1 tablet by mouth every 6 (six) hours as needed.             BP 151/93  Pulse 106  Temp(Src) 98.1 F (36.7 C) (Oral)  Resp 20  SpO2 100%  Physical Exam  Nursing note and vitals reviewed. Constitutional: She is oriented to person, place, and time. She appears well-developed and well-nourished.  HENT:  Head: Normocephalic and atraumatic.  Right Ear: Tympanic membrane, external ear and ear canal normal.  Left Ear: Tympanic membrane, external ear and ear canal normal.  Nose: Nose normal.  Mouth/Throat: Uvula is midline, oropharynx is clear and moist and mucous membranes are normal.  Neck: Neck supple.  Cardiovascular: Normal rate.   Pulmonary/Chest: Effort normal.  Musculoskeletal:       Lumbar back: She exhibits tenderness. She exhibits no bony tenderness.       Back:  Neurological: She is alert and oriented to person, place, and time. She has normal strength and normal reflexes. Gait normal.  Skin: Skin is warm and dry.  Psychiatric: She has a normal mood and affect.    ED Course  Procedures (including critical care time)  Labs Reviewed - No data to display No results found.   No diagnosis found.    MDM  Approx 1 month h/o (L) LBP that radiates into her (L) gluteal area and into posterior LLE to her (L) lateral knee. Initial pain after bending over suddenly to tie shoes. TTP over (L) gluteal region. No concern for Cauda-equina syndrome. Also reports persistent throat irritation after treatment for  tonsilitis w/ h/o seasonal allergies. PE unremarkable for pharyngeal erythema, swelling or exudate. Will treat for sciatica (L) and provide an ortho referral. Will encourage  OTC anti-allergy med daily until June along w/ PRN Chloraseptic spray and throat lozenges for relief of irritation. F/U PCP if no improvement.         Leanne Chang, NP 11/27/12 1220

## 2012-11-27 NOTE — ED Provider Notes (Signed)
Medical screening examination/treatment/procedure(s) were performed by non-physician practitioner and as supervising physician I was immediately available for consultation/collaboration.  Leslee Home, M.D.  Reuben Likes, MD 11/27/12 2033

## 2012-11-27 NOTE — ED Notes (Signed)
Pt c/o lower back pain located in the center of back. Hx pinched nerve. Pt states that the pain shoots down left leg and is having some mild swelling in left knee.  Pt is also c/o a sore throat. Had a recent dx of tonsilitis on 2/12 and was put on anitbiotics but states that when she finished the pain returned. Low grade.

## 2012-12-07 ENCOUNTER — Ambulatory Visit (INDEPENDENT_AMBULATORY_CARE_PROVIDER_SITE_OTHER): Payer: Medicaid Other | Admitting: Family Medicine

## 2012-12-07 ENCOUNTER — Encounter: Payer: Self-pay | Admitting: Family Medicine

## 2012-12-07 VITALS — BP 140/80 | HR 98 | Temp 98.3°F | Resp 14 | Wt 120.0 lb

## 2012-12-07 DIAGNOSIS — J019 Acute sinusitis, unspecified: Secondary | ICD-10-CM

## 2012-12-07 DIAGNOSIS — M545 Low back pain, unspecified: Secondary | ICD-10-CM

## 2012-12-07 DIAGNOSIS — M5432 Sciatica, left side: Secondary | ICD-10-CM

## 2012-12-07 DIAGNOSIS — M5430 Sciatica, unspecified side: Secondary | ICD-10-CM

## 2012-12-07 MED ORDER — PREDNISONE 20 MG PO TABS: 20.0000 mg | ORAL_TABLET | Freq: Every morning | ORAL | Status: DC

## 2012-12-07 MED ORDER — AMOXICILLIN-POT CLAVULANATE 875-125 MG PO TABS: 1.0000 | ORAL_TABLET | Freq: Two times a day (BID) | ORAL | Status: DC

## 2012-12-07 MED ORDER — GABAPENTIN 300 MG PO CAPS: 300.0000 mg | ORAL_CAPSULE | Freq: Three times a day (TID) | ORAL | Status: DC

## 2012-12-07 NOTE — Progress Notes (Signed)
Subjective:     Patient ID: Heather Gillespie, female   DOB: 1960-12-04, 52 y.o.   MRN: [id number]  HPI #1-  first 2 weeks of constant maxillary sinus pressure bilaterally.  She reports green purulent nasal discharge on a daily basis.  She reports sinus headaches, chills, fever.  She's tried over the counter decongestants and allergy medicines without benefit.  She denies sore throat or cough.  sHe denies nausea vomiting or diarrhea  #2- she reports 6 weeks of low back pain radiating down her left hamstring into her calf and left foot.  She reports burning dysesthesias and paresthesias in the left leg  with weakness in the left leg.  Patient states the left leg feels heavy at times.  She also reports near constant low back pain around the level of L5 that feels like a deep ache.  She is tried Mobic and Flexeril and rest without benefit.  She's gone to an urgent care and received a prednisone dose pack with some minimal therapeutic benefit. Review of Systems  HENT: Positive for congestion, rhinorrhea, sneezing, postnasal drip and tinnitus. Negative for hearing loss, ear pain, nosebleeds, facial swelling, neck pain, neck stiffness and ear discharge.   Eyes: Negative.   Respiratory: Negative.   Cardiovascular: Negative.   Gastrointestinal: Negative.   Genitourinary: Negative.   Musculoskeletal: Positive for back pain. Negative for myalgias, joint swelling and gait problem.  Neurological: Positive for weakness and numbness.       Objective:   Physical Exam  Constitutional: She appears well-developed and well-nourished.  HENT:  Head: Normocephalic and atraumatic.  Right Ear: Hearing, tympanic membrane, external ear and ear canal normal.  Left Ear: Hearing, tympanic membrane, external ear and ear canal normal.  Nose: Mucosal edema and rhinorrhea present.  Mouth/Throat: Uvula is midline, oropharynx is clear and moist and mucous membranes are normal.  Cardiovascular: Normal rate, regular rhythm,  S1 normal, S2 normal and normal heart sounds.   Pulmonary/Chest: Effort normal and breath sounds normal.  Abdominal: Normal appearance and bowel sounds are normal. There is no splenomegaly or hepatomegaly. There is no tenderness.  Musculoskeletal:       Lumbar back: She exhibits decreased range of motion, tenderness and spasm. She exhibits no bony tenderness.  Neurological: She has normal strength and normal reflexes. She displays a negative Romberg sign.   she has a positive left straight leg raise     Assessment:     Low back pain Sciatica left leg Sinusitis    Plan:     Augmentin 875 mg by mouth twice a day for 10 days Prednisone dose pack 20 mg tablet 3 tablets on days 1-2, 2 tablets on days 3 and 4, 1 tablet on day 5 and 6. Gabapentin 300 mg by mouth 3 times a day. Recheck in one week.  MRI of the lumbar spine if no better.

## 2012-12-14 ENCOUNTER — Encounter: Payer: Self-pay | Admitting: Family Medicine

## 2012-12-14 ENCOUNTER — Ambulatory Visit (INDEPENDENT_AMBULATORY_CARE_PROVIDER_SITE_OTHER): Payer: Medicaid Other | Admitting: Family Medicine

## 2012-12-14 VITALS — BP 120/80 | HR 80 | Temp 98.7°F | Resp 14 | Wt 120.0 lb

## 2012-12-14 DIAGNOSIS — M79605 Pain in left leg: Secondary | ICD-10-CM

## 2012-12-14 DIAGNOSIS — M545 Low back pain: Secondary | ICD-10-CM

## 2012-12-14 DIAGNOSIS — M5430 Sciatica, unspecified side: Secondary | ICD-10-CM

## 2012-12-14 DIAGNOSIS — M5432 Sciatica, left side: Secondary | ICD-10-CM

## 2012-12-14 NOTE — Progress Notes (Signed)
Subjective:     Patient ID: Heather Gillespie, female   DOB: 12-May-1961, 52 y.o.   MRN: [id number]  HPI   She reports 6 weeks of low back pain radiating down her left hamstring into her calf and left foot.  She reports burning dysesthesias and paresthesias in the left leg  with weakness in the left leg.  Patient states the left leg feels heavy at times.  She also reports near constant low back pain around the level of L5 that feels like a deep ache.  She is tried Mobic and Flexeril and rest without benefit.  She's gone to an urgent care and received a prednisone dose pack with some minimal therapeutic benefit.  Last week i started prednisone and gabapentin.  Today, the patient is doing 80% better.  She is able to walk without pain.  She denies sciatica or symptoms of cauda equina.  She denies leg weakness.    Review of Systems  HENT: Positive for congestion, rhinorrhea, sneezing, postnasal drip and tinnitus. Negative for hearing loss, ear pain, nosebleeds, facial swelling, neck pain, neck stiffness and ear discharge.   Eyes: Negative.   Respiratory: Negative.   Cardiovascular: Negative.   Gastrointestinal: Negative.   Genitourinary: Negative.   Musculoskeletal: Positive for back pain. Negative for myalgias, joint swelling and gait problem.  Neurological: Positive for weakness and numbness.       Objective:   Physical Exam  Constitutional: She appears well-developed and well-nourished.  HENT:  Head: Normocephalic and atraumatic.  Right Ear: Hearing, tympanic membrane, external ear and ear canal normal.  Left Ear: Hearing, tympanic membrane, external ear and ear canal normal.  Nose: Mucosal edema and rhinorrhea present.  Mouth/Throat: Uvula is midline, oropharynx is clear and moist and mucous membranes are normal.  Cardiovascular: Normal rate, regular rhythm, S1 normal, S2 normal and normal heart sounds.   Pulmonary/Chest: Effort normal and breath sounds normal.  Abdominal: Normal  appearance and bowel sounds are normal. There is no splenomegaly or hepatomegaly. There is no tenderness.  Musculoskeletal:       Lumbar back: She exhibits normal range of motion, no tenderness and no bony tenderness.  Neurological: She has normal strength and normal reflexes. She displays a negative Romberg sign.   she has a negative left straight leg raise     Assessment:     Low back pain Sciatica left leg     Plan:     Patient is much improved.  Continue the gabapentin 300 mg by mouth 3 times a day.  Monitor clinically, if symptoms return we did an MRI of the lumbar spine to evaluate for causes of left lumbar radiculopathy.  Patient will call me in May and we can reassess whether or not she needs to continue the gabapentin she continues to do well.

## 2012-12-15 ENCOUNTER — Telehealth: Payer: Self-pay | Admitting: Family Medicine

## 2012-12-15 NOTE — Telephone Encounter (Signed)
That's fine.  Call us if pain returns.

## 2012-12-15 NOTE — Telephone Encounter (Signed)
Pt aware.

## 2012-12-15 NOTE — Telephone Encounter (Signed)
FYI

## 2013-01-27 ENCOUNTER — Other Ambulatory Visit: Payer: Self-pay | Admitting: Family Medicine

## 2013-01-27 ENCOUNTER — Ambulatory Visit (INDEPENDENT_AMBULATORY_CARE_PROVIDER_SITE_OTHER): Payer: Medicaid Other | Admitting: Family Medicine

## 2013-01-27 ENCOUNTER — Encounter: Payer: Self-pay | Admitting: Family Medicine

## 2013-01-27 VITALS — BP 132/74 | HR 88 | Temp 98.3°F | Resp 16 | Wt 121.0 lb

## 2013-01-27 DIAGNOSIS — M5432 Sciatica, left side: Secondary | ICD-10-CM

## 2013-01-27 DIAGNOSIS — J019 Acute sinusitis, unspecified: Secondary | ICD-10-CM

## 2013-01-27 DIAGNOSIS — M5430 Sciatica, unspecified side: Secondary | ICD-10-CM

## 2013-01-27 MED ORDER — FLUTICASONE PROPIONATE 50 MCG/ACT NA SUSP: 2.0000 | Freq: Every day | NASAL | Status: DC

## 2013-01-27 MED ORDER — AMOXICILLIN-POT CLAVULANATE 875-125 MG PO TABS: 1.0000 | ORAL_TABLET | Freq: Two times a day (BID) | ORAL | Status: DC

## 2013-01-27 MED ORDER — PREDNISONE 20 MG PO TABS: ORAL_TABLET | ORAL | Status: DC

## 2013-01-27 NOTE — Progress Notes (Signed)
Subjective:     Patient ID: Heather Gillespie, female   DOB: 08-03-1961, 52 y.o.   MRN: [id number]  HPI  12/07/12  She reports 6 weeks of low back pain radiating down her left hamstring into her calf and left foot.  She reports burning dysesthesias and paresthesias in the left leg  with weakness in the left leg.  Patient states the left leg feels heavy at times.  She also reports near constant low back pain around the level of L5 that feels like a deep ache.  She is tried Mobic and Flexeril and rest without benefit.  She's gone to an urgent care and received a prednisone dose pack with some minimal therapeutic benefit. Therefore I diagnosed her with sciatica and put her back on a prednisone dose pack. Her symptoms completely resolved.  01/27/13 She now reports over one month of continued low back pain and left leg sciatica. She reports burning dysesthesias, paresthesias, and numbness radiating down her left leg into her feet. She also reports pain and swelling in her lower back.  She is to conservative therapy. She is also having 2 weeks of sinus pressure and pain. She is having purulent nasal discharge. She is having frontal and maxillary sinus headaches. She reports fever to 102 at home her last week.   Review of Systems  HENT: Positive for congestion, rhinorrhea, sneezing, postnasal drip and tinnitus. Negative for hearing loss, ear pain, nosebleeds, facial swelling, neck pain, neck stiffness and ear discharge.   Eyes: Negative.   Respiratory: Negative.   Cardiovascular: Negative.   Gastrointestinal: Negative.   Genitourinary: Negative.   Musculoskeletal: Positive for back pain. Negative for myalgias, joint swelling and gait problem.  Neurological: Positive for weakness and numbness.       Objective:   Physical Exam  Constitutional: She appears well-developed and well-nourished.  HENT:  Head: Normocephalic and atraumatic.  Right Ear: Hearing, tympanic membrane, external ear and ear  canal normal.  Left Ear: Hearing, tympanic membrane, external ear and ear canal normal.  Nose: Mucosal edema and rhinorrhea present.  Mouth/Throat: Uvula is midline, oropharynx is clear and moist and mucous membranes are normal.  Cardiovascular: Normal rate, regular rhythm, S1 normal, S2 normal and normal heart sounds.   Pulmonary/Chest: Effort normal and breath sounds normal.  Abdominal: Normal appearance and bowel sounds are normal. There is no splenomegaly or hepatomegaly. There is no tenderness.  Musculoskeletal:       Lumbar back: She exhibits decreased range of motion, tenderness and spasm. She exhibits no bony tenderness.  Neurological: She has normal strength and normal reflexes. She displays a negative Romberg sign.   she has a positive left straight leg raise     Assessment:     Low back pain Sciatica left leg Sinusitis    Plan:     Augmentin 875 mg by mouth twice a day for 10 days Prednisone dose pack 20 mg tablet 3 tablets on days 1-2, 2 tablets on days 3 and 4, 1 tablet on day 5 and 6. Gabapentin 300 mg by mouth 3 times a day. MRI of the lumbar spine

## 2013-01-29 ENCOUNTER — Other Ambulatory Visit: Payer: Self-pay | Admitting: Family Medicine

## 2013-01-31 NOTE — Telephone Encounter (Signed)
Medication refilled per protocol. 

## 2013-02-04 ENCOUNTER — Other Ambulatory Visit: Payer: Self-pay | Admitting: Family Medicine

## 2013-02-04 MED ORDER — HYDROCODONE-ACETAMINOPHEN 5-325 MG PO TABS: ORAL_TABLET | ORAL | Status: DC

## 2013-02-04 NOTE — Telephone Encounter (Signed)
Pt is aware and she states that she is hurting really really bad and it is radiating down her leg. Her MRI is scheduled for 02/11/13. I called her in Vicodin 5/325mg  1-2 po q 4-6 prn pain #60/0 refills.

## 2013-02-04 NOTE — Telephone Encounter (Signed)
?   OK to Refill  

## 2013-02-04 NOTE — Telephone Encounter (Signed)
Rx Refilled  

## 2013-02-04 NOTE — Telephone Encounter (Signed)
Not safe to continusously take prednisone.  I'd be glad to give her pain pills.  When is her MRI

## 2013-02-11 ENCOUNTER — Ambulatory Visit
Admission: RE | Admit: 2013-02-11 | Discharge: 2013-02-11 | Disposition: A | Discharge status: Home / Self Care | Payer: PRIVATE HEALTH INSURANCE | Source: Ambulatory Visit | Attending: Family Medicine | Admitting: Family Medicine

## 2013-02-11 ENCOUNTER — Other Ambulatory Visit: Payer: Self-pay | Admitting: Family Medicine

## 2013-02-11 DIAGNOSIS — M5432 Sciatica, left side: Secondary | ICD-10-CM

## 2013-02-15 ENCOUNTER — Ambulatory Visit (INDEPENDENT_AMBULATORY_CARE_PROVIDER_SITE_OTHER): Payer: Medicaid Other | Admitting: Family Medicine

## 2013-02-15 ENCOUNTER — Encounter: Payer: Self-pay | Admitting: Family Medicine

## 2013-02-15 VITALS — BP 132/80 | HR 80 | Temp 97.9°F | Resp 20 | Wt 120.0 lb

## 2013-02-15 DIAGNOSIS — M5432 Sciatica, left side: Secondary | ICD-10-CM

## 2013-02-15 DIAGNOSIS — M5430 Sciatica, unspecified side: Secondary | ICD-10-CM

## 2013-02-15 MED ORDER — IBUPROFEN 800 MG PO TABS: 800.0000 mg | ORAL_TABLET | Freq: Three times a day (TID) | ORAL | Status: DC | PRN

## 2013-02-15 MED ORDER — FEXOFENADINE HCL 180 MG PO TABS: 180.0000 mg | ORAL_TABLET | Freq: Every day | ORAL | Status: DC

## 2013-02-15 MED ORDER — OXYCODONE-ACETAMINOPHEN 10-325 MG PO TABS: 1.0000 | ORAL_TABLET | Freq: Three times a day (TID) | ORAL | Status: DC | PRN

## 2013-02-15 NOTE — Progress Notes (Signed)
Subjective:    Patient ID: Heather Gillespie, female    DOB: [birth year], 52 y.o.   MRN: [id number]  HPI Patient is complaining of severe pain in her lower back is radiating into her posterior left hip down her posterior thigh into her foot. It is unrelieved by Vicodin. Gabapentin makes her drowsy and is not helped with her pain. An MRI of the lumbar spine was obtained.  She has a broad based disc bulge at L4-L5. 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  She has an appointment to see Dr. Ethelene Hal on the 20th.  She has taken 3 courses of prednisone. This relieved her pain every time that she is taking 3 courses. She is requesting a fourth. Past Medical History  Diagnosis Date  . Glaucoma   . GERD (gastroesophageal reflux disease)   . Hemorrhoids   . IBS (irritable bowel syndrome)   . Hiatal hernia   . Abdominal pain   . Sinus problem   . N&V (nausea and vomiting)   . Rectal bleeding   . Rectal ulcer   . Allergy   . Vitamin D deficiency   . TMJ (dislocation of temporomandibular joint)   . Osteoporosis    Current Outpatient Prescriptions on File Prior to Visit  Medication Sig Dispense Refill  . amoxicillin-clavulanate (AUGMENTIN) 875-125 MG per tablet Take 1 tablet by mouth 2 (two) times daily.  20 tablet  0  . calcium citrate-vitamin D (CITRACAL+D) 315-200 MG-UNIT per tablet Take 1 tablet by mouth 2 (two) times daily.      . cholecalciferol (VITAMIN D) 1000 UNITS tablet Take 2,000 Units by mouth daily.      . cyclobenzaprine (FLEXERIL) 10 MG tablet Take 10 mg by mouth Nightly.        Marland Kitchen dexlansoprazole (DEXILANT) 60 MG capsule Take 60 mg by mouth daily.      . fluticasone (FLONASE) 50 MCG/ACT nasal spray Place 2 sprays into the nose daily.  16 g  11  . gabapentin (NEURONTIN) 300 MG capsule TAKE 1 CAPSULE THREE TIMES A DAY  90 capsule  5  . gabapentin (NEURONTIN) 300 MG capsule TAKE 1 CAPSULE THREE TIMES A DAY  90  capsule  2  . HYDROcodone-acetaminophen (NORCO/VICODIN) 5-325 MG per tablet 1-2 tab po q 4-6 hours prn pain  60 tablet  0  . Olopatadine HCl (PATADAY) 0.2 % SOLN Apply to eye daily.        . predniSONE (DELTASONE) 20 MG tablet 3 tabs on days 1-2, 2 tabs on days 3-4, 1 tab on days 5-6  12 tablet  0   No current facility-administered medications on file prior to visit.   Allergies  Allergen Reactions  . Hydroxyzine Pamoate Rash   History   Social History  . Marital Status: Single    Spouse Name: N/A    Number of Children: N/A  . Years of Education: N/A   Occupational History  . Not on file.   Social History Main Topics  . Smoking status: Never Smoker   . Smokeless tobacco: Never Used  . Alcohol Use: No  . Drug Use: No  . Sexually Active: Not on file   Other Topics Concern  . Not on file   Social History Narrative  . No narrative on file      Review of Systems  All other systems reviewed and are negative.       Objective:  Physical Exam  Vitals reviewed. Cardiovascular: Normal rate and regular rhythm.   Pulmonary/Chest: Effort normal and breath sounds normal.  Abdominal: Soft. Bowel sounds are normal.   she has a positive left straight leg raise        Assessment & Plan:  1. Left sided sciatica I discussed with the patient the risk of repeatedly taking prednisone. I recommended we try ibuprofen 800 mg Q8 hours. I asked the patient to discontinue Vicodin. We will switch her to Percocet 10/325 one by mouth every 8 hours when necessary pain.  She will try this for symptomatic relief until she can see the orthopedist. I also recommended Allegra 180 mg by mouth daily for allergy.

## 2013-02-22 ENCOUNTER — Telehealth: Payer: Self-pay | Admitting: Family Medicine

## 2013-02-22 DIAGNOSIS — J019 Acute sinusitis, unspecified: Secondary | ICD-10-CM

## 2013-02-22 MED ORDER — PREDNISONE 20 MG PO TABS: ORAL_TABLET | ORAL | Status: DC

## 2013-02-22 NOTE — Telephone Encounter (Signed)
Prednisone dose pack, ok

## 2013-02-22 NOTE — Telephone Encounter (Signed)
Rx Refilled  

## 2013-04-16 ENCOUNTER — Emergency Department (INDEPENDENT_AMBULATORY_CARE_PROVIDER_SITE_OTHER)
Admission: EM | Admit: 2013-04-16 | Discharge: 2013-04-16 | Disposition: A | Discharge status: Home / Self Care | Payer: Medicaid Other | Source: Home / Self Care

## 2013-04-16 ENCOUNTER — Emergency Department (INDEPENDENT_AMBULATORY_CARE_PROVIDER_SITE_OTHER): Payer: Medicaid Other

## 2013-04-16 ENCOUNTER — Encounter (HOSPITAL_COMMUNITY): Payer: Self-pay

## 2013-04-16 DIAGNOSIS — S8002XA Contusion of left knee, initial encounter: Secondary | ICD-10-CM

## 2013-04-16 DIAGNOSIS — S8000XA Contusion of unspecified knee, initial encounter: Secondary | ICD-10-CM

## 2013-04-16 MED ORDER — KETOROLAC TROMETHAMINE 30 MG/ML IJ SOLN
INTRAMUSCULAR | Status: AC
  Filled 2013-04-16: qty 1

## 2013-04-16 MED ORDER — KETOROLAC TROMETHAMINE 30 MG/ML IJ SOLN
30.0000 mg | Freq: Once | INTRAMUSCULAR | Status: AC
  Administered 2013-04-16: 30 mg via INTRAMUSCULAR

## 2013-04-16 MED ORDER — DICLOFENAC POTASSIUM 50 MG PO TABS: 50.0000 mg | ORAL_TABLET | Freq: Three times a day (TID) | ORAL | Status: DC

## 2013-04-16 NOTE — ED Notes (Signed)
States she fell 2 days ago, and has pain and swelling in her left knee

## 2013-04-16 NOTE — ED Provider Notes (Signed)
CSN: [id number]     Arrival date & time 04/16/13  1044 History     First MD Initiated Contact with Patient 04/16/13 1105     Chief Complaint  Patient presents with  . Fall   (Consider location/radiation/quality/duration/timing/severity/associated sxs/prior Treatment) HPI Comments: 52 year old female suffered a fall onto [address]. She fell directly onto her left knee. She is complaining of pain primarily to the anterolateral aspect. She says she is ambulatory but with pain. She has a small abrasion to the left hand but denies other injury.   Past Medical History  Diagnosis Date  . Glaucoma   . GERD (gastroesophageal reflux disease)   . Hemorrhoids   . IBS (irritable bowel syndrome)   . Hiatal hernia   . Abdominal pain   . Sinus problem   . N&V (nausea and vomiting)   . Rectal bleeding   . Rectal ulcer   . Allergy   . Vitamin D deficiency   . TMJ (dislocation of temporomandibular joint)   . Osteoporosis    Past Surgical History  Procedure Laterality Date  . Colonoscopy w/ polypectomy      colon ulcers  . Cholecystectomy  1984  . Hemorroidectomy    . Anal fissure repair    . Eye surgery     Family History  Problem Relation Age of Onset  . Diabetes Mother   . Stroke Father    History  Substance Use Topics  . Smoking status: Never Smoker   . Smokeless tobacco: Never Used  . Alcohol Use: No   OB History   Grav Para Term Preterm Abortions TAB SAB Ect Mult Living                 Review of Systems  Constitutional: Positive for activity change. Negative for fever, chills, diaphoresis and fatigue.  HENT: Negative.   Respiratory: Negative.   Cardiovascular: Negative.   Musculoskeletal:       As per HPI  Skin: Negative for color change, pallor and rash.       Bruise to knee, left  Neurological: Negative.     Allergies  Hydroxyzine pamoate  Home Medications   Current Outpatient Rx  Name  Route  Sig  Dispense  Refill  . amoxicillin-clavulanate (AUGMENTIN)  875-125 MG per tablet   Oral   Take 1 tablet by mouth 2 (two) times daily.   20 tablet   0   . calcium citrate-vitamin D (CITRACAL+D) 315-200 MG-UNIT per tablet   Oral   Take 1 tablet by mouth 2 (two) times daily.         . cholecalciferol (VITAMIN D) 1000 UNITS tablet   Oral   Take 2,000 Units by mouth daily.         . cyclobenzaprine (FLEXERIL) 10 MG tablet   Oral   Take 10 mg by mouth Nightly.           Marland Kitchen dexlansoprazole (DEXILANT) 60 MG capsule   Oral   Take 60 mg by mouth daily.         . fexofenadine (ALLEGRA) 180 MG tablet   Oral   Take 1 tablet (180 mg total) by mouth daily.   30 tablet   1   . fluticasone (FLONASE) 50 MCG/ACT nasal spray   Nasal   Place 2 sprays into the nose daily.   16 g   11   . gabapentin (NEURONTIN) 300 MG capsule      TAKE 1 CAPSULE THREE TIMES A DAY  90 capsule   5   . gabapentin (NEURONTIN) 300 MG capsule      TAKE 1 CAPSULE THREE TIMES A DAY   90 capsule   2   . HYDROcodone-acetaminophen (NORCO/VICODIN) 5-325 MG per tablet      1-2 tab po q 4-6 hours prn pain   60 tablet   0   . ibuprofen (ADVIL,MOTRIN) 800 MG tablet   Oral   Take 1 tablet (800 mg total) by mouth every 8 (eight) hours as needed for pain.   30 tablet   0   . Olopatadine HCl (PATADAY) 0.2 % SOLN   Ophthalmic   Apply to eye daily.           Marland Kitchen oxyCODONE-acetaminophen (PERCOCET) 10-325 MG per tablet   Oral   Take 1 tablet by mouth every 8 (eight) hours as needed for pain.   20 tablet   0   . diclofenac (CATAFLAM) 50 MG tablet   Oral   Take 1 tablet (50 mg total) by mouth 3 (three) times daily. Take with food.   15 tablet   0    BP 170/92  Pulse 90  Temp(Src) 99 F (37.2 C) (Oral)  Resp 20  SpO2 98% Physical Exam  Nursing note and vitals reviewed. Constitutional: She is oriented to person, place, and time. She appears well-developed and well-nourished. No distress.  HENT:  Head: Normocephalic and atraumatic.  Neck: Normal  range of motion. Neck supple.  Cardiovascular: Normal rate.   Pulmonary/Chest: Effort normal. No respiratory distress.  Musculoskeletal: She exhibits edema and tenderness.  Minor swelling to the anterolateral aspect of the L knee. Tenderness to this area and other bony prominences to lateral aspect. Tender over patellofemoral ligament. . No deformity.  Has congenital varus deformity of lower extremity.   Neurological: She is alert and oriented to person, place, and time. No cranial nerve deficit.  Skin: Skin is warm and dry.  Psychiatric: She has a normal mood and affect.    ED Course   Procedures (including critical care time)  Labs Reviewed - No data to display Dg Knee Complete 4 Views Left  04/16/2013   *RADIOLOGY REPORT*  Clinical Data: Larey Seat 2 days ago, persistent left knee pain.  Soft tissue swelling and abrasions anteriorly and laterally.  LEFT KNEE - COMPLETE 4+ VIEW  Comparison: None.  Findings: No evidence of acute or subacute fracture or dislocation. Well-preserved joint spaces.  No intrinsic osseous abnormalities. No evidence of a significant joint effusion.  IMPRESSION: Normal examination.   Original Report Authenticated By: Hulan Saas, M.D.   1. Knee contusion, left, initial encounter     MDM  Knee ACE wrap for 2-4 days RICE cataflam 50mg  TID prn Follow with your doctor as needed.   Hayden Rasmussen, NP 04/16/13 1245

## 2013-04-18 NOTE — ED Provider Notes (Signed)
Medical screening examination/treatment/procedure(s) were performed by resident physician or non-physician practitioner and as supervising physician I was immediately available for consultation/collaboration.   Alanny Rivers DOUGLAS MD.   Lourine Alberico D Kahealani Yankovich, MD 04/18/13 1657 

## 2013-05-02 ENCOUNTER — Other Ambulatory Visit (HOSPITAL_COMMUNITY): Payer: Self-pay | Admitting: Internal Medicine

## 2013-05-02 DIAGNOSIS — Z1231 Encounter for screening mammogram for malignant neoplasm of breast: Secondary | ICD-10-CM

## 2013-05-03 ENCOUNTER — Encounter: Payer: Self-pay | Admitting: Family Medicine

## 2013-05-03 ENCOUNTER — Ambulatory Visit (INDEPENDENT_AMBULATORY_CARE_PROVIDER_SITE_OTHER): Payer: Medicaid Other | Admitting: Family Medicine

## 2013-05-03 VITALS — BP 136/84 | HR 78 | Temp 98.7°F | Resp 14 | Wt 122.0 lb

## 2013-05-03 DIAGNOSIS — M25519 Pain in unspecified shoulder: Secondary | ICD-10-CM

## 2013-05-03 MED ORDER — CARISOPRODOL 350 MG PO TABS: 350.0000 mg | ORAL_TABLET | Freq: Three times a day (TID) | ORAL | Status: DC | PRN

## 2013-05-03 NOTE — Progress Notes (Signed)
Subjective:    Patient ID: Heather Gillespie, female    DOB: 12-17-1960, 52 y.o.   MRN: [id number]  HPI  Patient fell August 2 and suffered a contusion to her left knee. She was seen in urgent medical care and was diagnosed with contusion. She was prescribed diclofenac for pain. The contusion is now much better and has clinically resolved. She continues to report some tenderness and swelling around the anterior portion of her left tibia.   Her main concern today however is new pain in her left and right shoulders. She reports pain with abduction. She reports pain with internal index are rotation. She reports pain and stiffness although into her neck. Her neck muscles and shoulder muscles are stiff and sore. She denies any numbness or tingling in her arms. She denies any weakness in her grip strength. Past Medical History  Diagnosis Date  . Glaucoma   . GERD (gastroesophageal reflux disease)   . Hemorrhoids   . IBS (irritable bowel syndrome)   . Hiatal hernia   . Abdominal pain   . Sinus problem   . N&V (nausea and vomiting)   . Rectal bleeding   . Rectal ulcer   . Allergy   . Vitamin D deficiency   . TMJ (dislocation of temporomandibular joint)   . Osteoporosis    Current Outpatient Prescriptions on File Prior to Visit  Medication Sig Dispense Refill  . calcium citrate-vitamin D (CITRACAL+D) 315-200 MG-UNIT per tablet Take 1 tablet by mouth 2 (two) times daily.      . cholecalciferol (VITAMIN D) 1000 UNITS tablet Take 2,000 Units by mouth daily.      . cyclobenzaprine (FLEXERIL) 10 MG tablet Take 10 mg by mouth Nightly.        Marland Kitchen dexlansoprazole (DEXILANT) 60 MG capsule Take 60 mg by mouth daily.      . diclofenac (CATAFLAM) 50 MG tablet Take 1 tablet (50 mg total) by mouth 3 (three) times daily. Take with food.  15 tablet  0  . fexofenadine (ALLEGRA) 180 MG tablet Take 1 tablet (180 mg total) by mouth daily.  30 tablet  1  . fluticasone (FLONASE) 50 MCG/ACT nasal spray Place 2 sprays  into the nose daily.  16 g  11  . Olopatadine HCl (PATADAY) 0.2 % SOLN Apply to eye daily.        Marland Kitchen gabapentin (NEURONTIN) 300 MG capsule TAKE 1 CAPSULE THREE TIMES A DAY  90 capsule  5   No current facility-administered medications on file prior to visit.   Allergies  Allergen Reactions  . Hydroxyzine Pamoate Rash   History   Social History  . Marital Status: Single    Spouse Name: N/A    Number of Children: N/A  . Years of Education: N/A   Occupational History  . Not on file.   Social History Main Topics  . Smoking status: Never Smoker   . Smokeless tobacco: Never Used  . Alcohol Use: No  . Drug Use: No  . Sexual Activity: Not on file   Other Topics Concern  . Not on file   Social History Narrative  . No narrative on file     Review of Systems  All other systems reviewed and are negative.       Objective:   Physical Exam  Vitals reviewed. Constitutional: She appears well-developed and well-nourished.  Cardiovascular: Normal rate, regular rhythm and normal heart sounds.   Pulmonary/Chest: Effort normal and breath sounds normal. No respiratory  distress. She has no wheezes. She has no rales.  Musculoskeletal:       Right shoulder: She exhibits tenderness, pain and spasm. She exhibits normal range of motion, no bony tenderness, no swelling, no effusion, no crepitus, no deformity and normal strength.       Left shoulder: She exhibits tenderness, pain and spasm. She exhibits normal range of motion, no bony tenderness, no swelling, no effusion, no crepitus, no deformity and normal strength.   patient has a negative empty can sign bilaterally, negative O'Brien sign bilaterally, a negative Hawkins sign bilaterally, and a negative Spurling sign bilaterally. She has normal grip strength and no evidence of neurovascular compromise in the extremities.        Assessment & Plan:  1. Pain in joint, shoulder region, unspecified laterality I believe she suffered a neck  strain and shoulder strain during her fall. I recommended soma 350 mg every 8 hours when necessary muscle spasm. I warned her about the sedative effects of soma. I asked her to discontinue Flexeril. I asked her to continue the diclofenac as needed for pain. I anticipate self-limiting resolution in one week. She is to return immediately if worse or in one week if no better. - carisoprodol (SOMA) 350 MG tablet; Take 1 tablet (350 mg total) by mouth 3 (three) times daily as needed for muscle spasms (stop flexeril).  Dispense: 30 tablet; Refill: 0

## 2013-05-18 ENCOUNTER — Other Ambulatory Visit: Payer: Self-pay | Admitting: Orthopedic Surgery

## 2013-05-18 DIAGNOSIS — M48061 Spinal stenosis, lumbar region without neurogenic claudication: Secondary | ICD-10-CM

## 2013-05-18 DIAGNOSIS — M21372 Foot drop, left foot: Secondary | ICD-10-CM

## 2013-05-23 ENCOUNTER — Ambulatory Visit
Admission: RE | Admit: 2013-05-23 | Discharge: 2013-05-23 | Disposition: A | Discharge status: Home / Self Care | Payer: PRIVATE HEALTH INSURANCE | Source: Ambulatory Visit | Attending: Orthopedic Surgery | Admitting: Orthopedic Surgery

## 2013-05-23 VITALS — BP 113/70 | HR 75

## 2013-05-23 DIAGNOSIS — M21372 Foot drop, left foot: Secondary | ICD-10-CM

## 2013-05-23 DIAGNOSIS — M48061 Spinal stenosis, lumbar region without neurogenic claudication: Secondary | ICD-10-CM

## 2013-05-23 MED ORDER — DIAZEPAM 5 MG PO TABS
10.0000 mg | ORAL_TABLET | Freq: Once | ORAL | Status: AC
  Administered 2013-05-23: 10 mg via ORAL

## 2013-05-23 MED ORDER — IOHEXOL 180 MG/ML  SOLN
20.0000 mL | Freq: Once | INTRAMUSCULAR | Status: AC | PRN
  Administered 2013-05-23: 20 mL via INTRATHECAL

## 2013-05-23 MED ORDER — MEPERIDINE HCL 100 MG/ML IJ SOLN
75.0000 mg | Freq: Once | INTRAMUSCULAR | Status: AC
  Administered 2013-05-23: 75 mg via INTRAMUSCULAR

## 2013-05-23 MED ORDER — ONDANSETRON HCL 4 MG/2ML IJ SOLN
4.0000 mg | Freq: Once | INTRAMUSCULAR | Status: AC
  Administered 2013-05-23: 4 mg via INTRAMUSCULAR

## 2013-05-24 ENCOUNTER — Telehealth: Payer: Self-pay | Admitting: Radiology

## 2013-05-24 NOTE — Telephone Encounter (Signed)
Pt called because her back and her hips were very sore this am. Pt had myelo yesterday. Explained that this was normal and as soon as she was able to move around , that most of the soreness would go away.

## 2013-05-25 ENCOUNTER — Encounter (HOSPITAL_COMMUNITY): Payer: Self-pay | Admitting: *Deleted

## 2013-05-25 NOTE — Progress Notes (Signed)
Called patient to review medical history and give instructions.  Father stated she is at the store and will be home in 30-45 min- does not have cell phone.   Left phone number of short stay and told him very important that she calls soon as she gets home. Does not have cell phone

## 2013-05-26 ENCOUNTER — Ambulatory Visit (HOSPITAL_COMMUNITY): Payer: PRIVATE HEALTH INSURANCE

## 2013-05-26 ENCOUNTER — Encounter (HOSPITAL_COMMUNITY)
Admission: RE | Disposition: A | Discharge status: Home / Self Care | Payer: Self-pay | Source: Ambulatory Visit | Attending: Orthopedic Surgery

## 2013-05-26 ENCOUNTER — Observation Stay (HOSPITAL_COMMUNITY)
Admission: RE | Admit: 2013-05-26 | Discharge: 2013-05-28 | Disposition: A | Discharge status: Home / Self Care | Payer: PRIVATE HEALTH INSURANCE | Source: Ambulatory Visit | Attending: Orthopedic Surgery | Admitting: Orthopedic Surgery

## 2013-05-26 ENCOUNTER — Encounter (HOSPITAL_COMMUNITY): Payer: Self-pay | Admitting: *Deleted

## 2013-05-26 ENCOUNTER — Encounter (HOSPITAL_COMMUNITY): Payer: Self-pay | Admitting: Anesthesiology

## 2013-05-26 ENCOUNTER — Ambulatory Visit (HOSPITAL_COMMUNITY): Payer: PRIVATE HEALTH INSURANCE | Admitting: Anesthesiology

## 2013-05-26 DIAGNOSIS — M48062 Spinal stenosis, lumbar region with neurogenic claudication: Principal | ICD-10-CM | POA: Diagnosis present

## 2013-05-26 DIAGNOSIS — M810 Age-related osteoporosis without current pathological fracture: Secondary | ICD-10-CM | POA: Insufficient documentation

## 2013-05-26 DIAGNOSIS — M216X9 Other acquired deformities of unspecified foot: Secondary | ICD-10-CM | POA: Insufficient documentation

## 2013-05-26 DIAGNOSIS — K219 Gastro-esophageal reflux disease without esophagitis: Secondary | ICD-10-CM | POA: Insufficient documentation

## 2013-05-26 DIAGNOSIS — Z79899 Other long term (current) drug therapy: Secondary | ICD-10-CM | POA: Insufficient documentation

## 2013-05-26 DIAGNOSIS — M5106 Intervertebral disc disorders with myelopathy, lumbar region: Secondary | ICD-10-CM | POA: Diagnosis present

## 2013-05-26 HISTORY — PX: HEMI-MICRODISCECTOMY LUMBAR LAMINECTOMY LEVEL 1: SHX5846

## 2013-05-26 LAB — URINALYSIS, ROUTINE W REFLEX MICROSCOPIC
Bilirubin Urine: NEGATIVE
Glucose, UA: NEGATIVE mg/dL
Hgb urine dipstick: NEGATIVE
Ketones, ur: NEGATIVE mg/dL
Leukocytes, UA: NEGATIVE
Nitrite: NEGATIVE
Protein, ur: NEGATIVE mg/dL
Specific Gravity, Urine: 1.022 (ref 1.005–1.030)
Urobilinogen, UA: 0.2 mg/dL (ref 0.0–1.0)
pH: 7.5 (ref 5.0–8.0)

## 2013-05-26 LAB — PROTIME-INR
INR: 1.11 (ref 0.00–1.49)
Prothrombin Time: 14.1 seconds (ref 11.6–15.2)

## 2013-05-26 LAB — COMPREHENSIVE METABOLIC PANEL
ALT: 9 U/L (ref 0–35)
AST: 15 U/L (ref 0–37)
Albumin: 3.9 g/dL (ref 3.5–5.2)
Alkaline Phosphatase: 57 U/L (ref 39–117)
BUN: 11 mg/dL (ref 6–23)
CO2: 29 mEq/L (ref 19–32)
Calcium: 9.7 mg/dL (ref 8.4–10.5)
Chloride: 104 mEq/L (ref 96–112)
Creatinine, Ser: 0.64 mg/dL (ref 0.50–1.10)
GFR calc Af Amer: >90 mL/min (ref >90)
GFR calc non Af Amer: >90 mL/min (ref >90)
Glucose, Bld: 87 mg/dL (ref 70–99)
Potassium: 3.7 mEq/L (ref 3.5–5.1)
Sodium: 138 mEq/L (ref 135–145)
Total Bilirubin: 0.6 mg/dL (ref 0.3–1.2)
Total Protein: 6.9 g/dL (ref 6.0–8.3)

## 2013-05-26 LAB — CBC
HCT: 37.8 % (ref 36.0–46.0)
Platelets: 160 10*3/uL (ref 150–400)
RDW: 13.1 % (ref 11.5–15.5)
WBC: 4.6 10*3/uL (ref 4.0–10.5)

## 2013-05-26 LAB — APTT: aPTT: 28 seconds (ref 24–37)

## 2013-05-26 LAB — SURGICAL PCR SCREEN: Staphylococcus aureus: POSITIVE — AB

## 2013-05-26 SURGERY — HEMI-MICRODISCECTOMY LUMBAR LAMINECTOMY LEVEL 1
Anesthesia: General | Site: Back | Laterality: Left | Wound class: Clean

## 2013-05-26 MED ORDER — GLYCOPYRROLATE 0.2 MG/ML IJ SOLN
INTRAMUSCULAR | Status: DC | PRN
  Administered 2013-05-26: 0.6 mg via INTRAVENOUS

## 2013-05-26 MED ORDER — BUPIVACAINE LIPOSOME 1.3 % IJ SUSP
20.0000 mL | Freq: Once | INTRAMUSCULAR | Status: DC
  Filled 2013-05-26: qty 20

## 2013-05-26 MED ORDER — NEOSTIGMINE METHYLSULFATE 1 MG/ML IJ SOLN
INTRAMUSCULAR | Status: DC | PRN
  Administered 2013-05-26: 4 mg via INTRAVENOUS

## 2013-05-26 MED ORDER — MUPIROCIN 2 % EX OINT
TOPICAL_OINTMENT | Freq: Two times a day (BID) | CUTANEOUS | Status: DC
  Filled 2013-05-26: qty 22

## 2013-05-26 MED ORDER — ONDANSETRON HCL 4 MG/2ML IJ SOLN
4.0000 mg | INTRAMUSCULAR | Status: DC | PRN
  Administered 2013-05-26 – 2013-05-27 (×3): 4 mg via INTRAVENOUS
  Filled 2013-05-26 (×3): qty 2

## 2013-05-26 MED ORDER — ACETAMINOPHEN 650 MG RE SUPP: 650.0000 mg | RECTAL | Status: DC | PRN

## 2013-05-26 MED ORDER — ONDANSETRON HCL 4 MG/2ML IJ SOLN
INTRAMUSCULAR | Status: DC | PRN
  Administered 2013-05-26: 4 mg via INTRAVENOUS

## 2013-05-26 MED ORDER — OLOPATADINE HCL 0.1 % OP SOLN
1.0000 [drp] | Freq: Two times a day (BID) | OPHTHALMIC | Status: DC
  Administered 2013-05-26 – 2013-05-28 (×4): 1 [drp] via OPHTHALMIC
  Filled 2013-05-26: qty 5

## 2013-05-26 MED ORDER — ROCURONIUM BROMIDE 100 MG/10ML IV SOLN
INTRAVENOUS | Status: DC | PRN
  Administered 2013-05-26: 20 mg via INTRAVENOUS

## 2013-05-26 MED ORDER — BUPIVACAINE-EPINEPHRINE PF 0.25-1:200000 % IJ SOLN
INTRAMUSCULAR | Status: DC | PRN
  Administered 2013-05-26: 10 mL

## 2013-05-26 MED ORDER — BISACODYL 10 MG RE SUPP: 10.0000 mg | Freq: Every day | RECTAL | Status: DC | PRN

## 2013-05-26 MED ORDER — FENTANYL CITRATE 0.05 MG/ML IJ SOLN
INTRAMUSCULAR | Status: DC | PRN
  Administered 2013-05-26: 100 ug via INTRAVENOUS
  Administered 2013-05-26: 50 ug via INTRAVENOUS
  Administered 2013-05-26: 100 ug via INTRAVENOUS

## 2013-05-26 MED ORDER — VANCOMYCIN HCL IN DEXTROSE 1-5 GM/200ML-% IV SOLN
INTRAVENOUS | Status: AC
  Filled 2013-05-26: qty 200

## 2013-05-26 MED ORDER — METHOCARBAMOL 100 MG/ML IJ SOLN
500.0000 mg | Freq: Four times a day (QID) | INTRAVENOUS | Status: DC | PRN
  Administered 2013-05-26: 500 mg via INTRAVENOUS
  Filled 2013-05-26: qty 5

## 2013-05-26 MED ORDER — THROMBIN 5000 UNITS EX SOLR
CUTANEOUS | Status: DC | PRN
  Administered 2013-05-26: 10000 [IU] via TOPICAL

## 2013-05-26 MED ORDER — VECURONIUM BROMIDE 10 MG IV SOLR: INTRAVENOUS | Status: DC | PRN

## 2013-05-26 MED ORDER — FLUTICASONE PROPIONATE 50 MCG/ACT NA SUSP
2.0000 | Freq: Every day | NASAL | Status: DC
Start: 2013-05-27 — End: 2013-05-28
  Filled 2013-05-26: qty 16

## 2013-05-26 MED ORDER — METHOCARBAMOL 500 MG PO TABS
500.0000 mg | ORAL_TABLET | Freq: Four times a day (QID) | ORAL | Status: DC | PRN
  Administered 2013-05-28 (×2): 500 mg via ORAL
  Filled 2013-05-26 (×3): qty 1

## 2013-05-26 MED ORDER — OXYCODONE-ACETAMINOPHEN 5-325 MG PO TABS
1.0000 | ORAL_TABLET | ORAL | Status: DC | PRN
  Administered 2013-05-28 (×3): 2 via ORAL
  Filled 2013-05-26 (×3): qty 2

## 2013-05-26 MED ORDER — FLEET ENEMA 7-19 GM/118ML RE ENEM: 1.0000 | ENEMA | Freq: Once | RECTAL | Status: AC | PRN

## 2013-05-26 MED ORDER — HYDROMORPHONE HCL PF 1 MG/ML IJ SOLN
0.5000 mg | INTRAMUSCULAR | Status: DC | PRN
  Administered 2013-05-26: 22:00:00 1 mg via INTRAVENOUS
  Administered 2013-05-26: 0.5 mg via INTRAVENOUS
  Administered 2013-05-27 (×4): 1 mg via INTRAVENOUS
  Filled 2013-05-26 (×6): qty 1

## 2013-05-26 MED ORDER — PROMETHAZINE HCL 25 MG/ML IJ SOLN: 6.2500 mg | INTRAMUSCULAR | Status: DC | PRN

## 2013-05-26 MED ORDER — HYDROMORPHONE HCL PF 1 MG/ML IJ SOLN
0.2500 mg | INTRAMUSCULAR | Status: DC | PRN
  Administered 2013-05-26: 0.5 mg via INTRAVENOUS
  Administered 2013-05-26 (×2): 0.25 mg via INTRAVENOUS

## 2013-05-26 MED ORDER — VANCOMYCIN HCL IN DEXTROSE 1-5 GM/200ML-% IV SOLN
1000.0000 mg | Freq: Once | INTRAVENOUS | Status: AC
  Administered 2013-05-26: 1000 mg via INTRAVENOUS

## 2013-05-26 MED ORDER — SUCCINYLCHOLINE CHLORIDE 20 MG/ML IJ SOLN
INTRAMUSCULAR | Status: DC | PRN
  Administered 2013-05-26: 100 mg via INTRAVENOUS

## 2013-05-26 MED ORDER — BUPIVACAINE-EPINEPHRINE PF 0.25-1:200000 % IJ SOLN
INTRAMUSCULAR | Status: AC
  Filled 2013-05-26: qty 30

## 2013-05-26 MED ORDER — PANTOPRAZOLE SODIUM 40 MG PO TBEC
40.0000 mg | DELAYED_RELEASE_TABLET | Freq: Every day | ORAL | Status: DC
  Administered 2013-05-27 – 2013-05-28 (×2): 40 mg via ORAL
  Filled 2013-05-26 (×2): qty 1

## 2013-05-26 MED ORDER — LACTATED RINGERS IV SOLN
INTRAVENOUS | Status: DC
  Administered 2013-05-26: 22:00:00 via INTRAVENOUS

## 2013-05-26 MED ORDER — MIDAZOLAM HCL 5 MG/5ML IJ SOLN
INTRAMUSCULAR | Status: DC | PRN
  Administered 2013-05-26: 2 mg via INTRAVENOUS

## 2013-05-26 MED ORDER — CEFAZOLIN SODIUM 1-5 GM-% IV SOLN
1.0000 g | Freq: Three times a day (TID) | INTRAVENOUS | Status: AC
  Administered 2013-05-26 – 2013-05-27 (×3): 1 g via INTRAVENOUS
  Filled 2013-05-26 (×5): qty 50

## 2013-05-26 MED ORDER — BUPIVACAINE LIPOSOME 1.3 % IJ SUSP
INTRAMUSCULAR | Status: DC | PRN
  Administered 2013-05-26: 20 mL

## 2013-05-26 MED ORDER — DEXAMETHASONE SODIUM PHOSPHATE 10 MG/ML IJ SOLN
INTRAMUSCULAR | Status: DC | PRN
  Administered 2013-05-26: 10 mg via INTRAVENOUS

## 2013-05-26 MED ORDER — MENTHOL 3 MG MT LOZG
1.0000 | LOZENGE | OROMUCOSAL | Status: DC | PRN
  Filled 2013-05-26: qty 9

## 2013-05-26 MED ORDER — BUPIVACAINE HCL 0.25 % IJ SOLN
INTRAMUSCULAR | Status: AC
  Filled 2013-05-26: qty 1

## 2013-05-26 MED ORDER — CEFAZOLIN SODIUM-DEXTROSE 2-3 GM-% IV SOLR: 2.0000 g | INTRAVENOUS | Status: DC

## 2013-05-26 MED ORDER — SODIUM CHLORIDE 0.9 % IR SOLN
Status: DC | PRN
  Administered 2013-05-26: 17:00:00

## 2013-05-26 MED ORDER — POLYETHYLENE GLYCOL 3350 17 G PO PACK: 17.0000 g | PACK | Freq: Every day | ORAL | Status: DC | PRN

## 2013-05-26 MED ORDER — THROMBIN 5000 UNITS EX SOLR
CUTANEOUS | Status: AC
  Filled 2013-05-26: qty 10000

## 2013-05-26 MED ORDER — BACITRACIN-NEOMYCIN-POLYMYXIN 400-5-5000 EX OINT
TOPICAL_OINTMENT | CUTANEOUS | Status: DC | PRN
  Administered 2013-05-26: 1 via TOPICAL

## 2013-05-26 MED ORDER — HYDROCODONE-ACETAMINOPHEN 5-325 MG PO TABS
1.0000 | ORAL_TABLET | ORAL | Status: DC | PRN
  Administered 2013-05-27: 20:00:00 2 via ORAL
  Administered 2013-05-27: 1 via ORAL
  Filled 2013-05-26 (×2): qty 1
  Filled 2013-05-26: qty 2

## 2013-05-26 MED ORDER — CEFAZOLIN SODIUM-DEXTROSE 2-3 GM-% IV SOLR
INTRAVENOUS | Status: AC
  Filled 2013-05-26: qty 50

## 2013-05-26 MED ORDER — BACITRACIN ZINC 500 UNIT/GM EX OINT
TOPICAL_OINTMENT | CUTANEOUS | Status: AC
  Filled 2013-05-26: qty 28.35

## 2013-05-26 MED ORDER — HYDROMORPHONE HCL PF 1 MG/ML IJ SOLN
INTRAMUSCULAR | Status: AC
  Filled 2013-05-26: qty 1

## 2013-05-26 MED ORDER — ACETAMINOPHEN 325 MG PO TABS
650.0000 mg | ORAL_TABLET | ORAL | Status: DC | PRN
  Administered 2013-05-27: 12:00:00 650 mg via ORAL
  Filled 2013-05-26: qty 2

## 2013-05-26 MED ORDER — PROPOFOL 10 MG/ML IV BOLUS
INTRAVENOUS | Status: DC | PRN
  Administered 2013-05-26: 150 mg via INTRAVENOUS

## 2013-05-26 MED ORDER — BUPIVACAINE-EPINEPHRINE (PF) 0.25% -1:200000 IJ SOLN
30.0000 mL | Freq: Once | INTRAMUSCULAR | Status: DC
  Filled 2013-05-26: qty 30

## 2013-05-26 MED ORDER — PHENOL 1.4 % MT LIQD
1.0000 | OROMUCOSAL | Status: DC | PRN
  Filled 2013-05-26: qty 177

## 2013-05-26 MED ORDER — GABAPENTIN 300 MG PO CAPS
300.0000 mg | ORAL_CAPSULE | Freq: Every evening | ORAL | Status: DC | PRN
  Filled 2013-05-26: qty 1

## 2013-05-26 MED ORDER — LACTATED RINGERS IV SOLN
INTRAVENOUS | Status: DC
  Administered 2013-05-26: 1000 mL via INTRAVENOUS
  Administered 2013-05-26: 18:00:00 via INTRAVENOUS

## 2013-05-26 SURGICAL SUPPLY — 50 items
APL SKNCLS STERI-STRIP NONHPOA (GAUZE/BANDAGES/DRESSINGS) ×1
BAG SPEC THK2 15X12 ZIP CLS (MISCELLANEOUS) ×1
BAG ZIPLOCK 12X15 (MISCELLANEOUS) ×2 IMPLANT
BENZOIN TINCTURE PRP APPL 2/3 (GAUZE/BANDAGES/DRESSINGS) ×2 IMPLANT
CLEANER TIP ELECTROSURG 2X2 (MISCELLANEOUS) ×2 IMPLANT
CLOTH BEACON ORANGE TIMEOUT ST (SAFETY) ×2 IMPLANT
CONT SPEC 4OZ CLIKSEAL STRL BL (MISCELLANEOUS) ×1 IMPLANT
CONT SPECI 4OZ STER CLIK (MISCELLANEOUS) ×2 IMPLANT
DRAIN PENROSE 18X1/4 LTX STRL (WOUND CARE) IMPLANT
DRAPE LG THREE QUARTER DISP (DRAPES) ×1 IMPLANT
DRAPE MICROSCOPE LEICA (MISCELLANEOUS) ×2 IMPLANT
DRAPE POUCH INSTRU U-SHP 10X18 (DRAPES) ×2 IMPLANT
DRAPE SURG 17X11 SM STRL (DRAPES) ×2 IMPLANT
DRSG ADAPTIC 3X8 NADH LF (GAUZE/BANDAGES/DRESSINGS) ×2 IMPLANT
DRSG EMULSION OIL 3X16 NADH (GAUZE/BANDAGES/DRESSINGS) ×1 IMPLANT
DRSG PAD ABDOMINAL 8X10 ST (GAUZE/BANDAGES/DRESSINGS) ×2 IMPLANT
DURAPREP 26ML APPLICATOR (WOUND CARE) ×2 IMPLANT
ELECT REM PT RETURN 9FT ADLT (ELECTROSURGICAL) ×2
ELECTRODE REM PT RTRN 9FT ADLT (ELECTROSURGICAL) ×1 IMPLANT
GLOVE BIOGEL PI IND STRL 8 (GLOVE) ×1 IMPLANT
GLOVE BIOGEL PI INDICATOR 8 (GLOVE) ×1
GLOVE ECLIPSE 8.0 STRL XLNG CF (GLOVE) ×4 IMPLANT
GOWN PREVENTION PLUS LG XLONG (DISPOSABLE) ×6 IMPLANT
GOWN STRL REIN XL XLG (GOWN DISPOSABLE) ×4 IMPLANT
KIT BASIN OR (CUSTOM PROCEDURE TRAY) ×2 IMPLANT
KIT POSITIONING SURG ANDREWS (MISCELLANEOUS) ×2 IMPLANT
MANIFOLD NEPTUNE II (INSTRUMENTS) ×2 IMPLANT
NDL SPNL 18GX3.5 QUINCKE PK (NEEDLE) ×2 IMPLANT
NEEDLE HYPO 22GX1.5 SAFETY (NEEDLE) ×1 IMPLANT
NEEDLE SPNL 18GX3.5 QUINCKE PK (NEEDLE) ×6 IMPLANT
NS IRRIG 1000ML POUR BTL (IV SOLUTION) ×2 IMPLANT
PATTIES SURGICAL .5 X.5 (GAUZE/BANDAGES/DRESSINGS) IMPLANT
PATTIES SURGICAL .75X.75 (GAUZE/BANDAGES/DRESSINGS) IMPLANT
PATTIES SURGICAL 1X1 (DISPOSABLE) IMPLANT
PIN SAFETY NICK PLATE  2 MED (MISCELLANEOUS)
PIN SAFETY NICK PLATE 2 MED (MISCELLANEOUS) IMPLANT
POSITIONER SURGICAL ARM (MISCELLANEOUS) ×2 IMPLANT
SPONGE GAUZE 4X4 12PLY (GAUZE/BANDAGES/DRESSINGS) ×1 IMPLANT
SPONGE LAP 4X18 X RAY DECT (DISPOSABLE) ×2 IMPLANT
SPONGE SURGIFOAM ABS GEL 100 (HEMOSTASIS) ×2 IMPLANT
STAPLER VISISTAT 35W (STAPLE) ×1 IMPLANT
SUT VIC AB 0 CT1 27 (SUTURE) ×2
SUT VIC AB 0 CT1 27XBRD ANTBC (SUTURE) ×1 IMPLANT
SUT VIC AB 1 CT1 27 (SUTURE) ×8
SUT VIC AB 1 CT1 27XBRD ANTBC (SUTURE) ×4 IMPLANT
SYR 20CC LL (SYRINGE) ×1 IMPLANT
TAPE CLOTH SURG 6X10 WHT LF (GAUZE/BANDAGES/DRESSINGS) ×1 IMPLANT
TOWEL OR 17X26 10 PK STRL BLUE (TOWEL DISPOSABLE) ×4 IMPLANT
TRAY LAMINECTOMY (CUSTOM PROCEDURE TRAY) ×2 IMPLANT
WATER STERILE IRR 1500ML POUR (IV SOLUTION) ×2 IMPLANT

## 2013-05-26 NOTE — Interval H&P Note (Signed)
History and Physical Interval Note:  05/26/2013 3:55 PM  Minerva Ends  has presented today for surgery, with the diagnosis of herniated disc L4 - L5 on the left  The various methods of treatment have been discussed with the patient and family. After consideration of risks, benefits and other options for treatment, the patient has consented to  Procedure(s): HEMI-MICRODISCECTOMY LUMBAR LAMINECTOMY L4 - L5 ON THE LEFT LEVEL 1 (Left) as a surgical intervention .  The patient's history has been reviewed, patient examined, no change in status, stable for surgery.  I have reviewed the patient's chart and labs.  Questions were answered to the patient's satisfaction.     Drago Hammonds A

## 2013-05-26 NOTE — Anesthesia Preprocedure Evaluation (Signed)
Anesthesia Evaluation  Patient identified by MRN, date of birth, ID band Patient awake    Reviewed: Allergy & Precautions, H&P , NPO status , Patient's Chart, lab work & pertinent test results  Airway Mallampati: II TM Distance: >3 FB Neck ROM: Full    Dental no notable dental hx.    Pulmonary neg pulmonary ROS,  breath sounds clear to auscultation  Pulmonary exam normal       Cardiovascular negative cardio ROS  Rhythm:Regular Rate:Normal     Neuro/Psych negative neurological ROS  negative psych ROS   GI/Hepatic Neg liver ROS, GERD-  ,  Endo/Other  negative endocrine ROS  Renal/GU negative Renal ROS  negative genitourinary   Musculoskeletal negative musculoskeletal ROS (+)   Abdominal   Peds negative pediatric ROS (+)  Hematology negative hematology ROS (+)   Anesthesia Other Findings   Reproductive/Obstetrics negative OB ROS                           Anesthesia Physical Anesthesia Plan  ASA: II  Anesthesia Plan: General   Post-op Pain Management:    Induction: Intravenous  Airway Management Planned: Oral ETT  Additional Equipment:   Intra-op Plan:   Post-operative Plan: Extubation in OR  Informed Consent: I have reviewed the patients History and Physical, chart, labs and discussed the procedure including the risks, benefits and alternatives for the proposed anesthesia with the patient or authorized representative who has indicated his/her understanding and acceptance.   Dental advisory given  Plan Discussed with: CRNA and Surgeon  Anesthesia Plan Comments:         Anesthesia Quick Evaluation  

## 2013-05-26 NOTE — Anesthesia Postprocedure Evaluation (Signed)
  Anesthesia Post-op Note  Patient: Heather Gillespie  Procedure(s) Performed: Procedure(s) (LRB): HEMI-MICRODISCECTOMY LUMBAR LAMINECTOMY L4 - L5 ON THE LEFT LEVEL 1 (Left)  Patient Location: PACU  Anesthesia Type: General  Level of Consciousness: awake and alert   Airway and Oxygen Therapy: Patient Spontanous Breathing  Post-op Pain: mild  Post-op Assessment: Post-op Vital signs reviewed, Patient's Cardiovascular Status Stable, Respiratory Function Stable, Patent Airway and No signs of Nausea or vomiting  Last Vitals:  Filed Vitals:   05/26/13 1845  BP: 125/77  Pulse: 84  Temp:   Resp: 11    Post-op Vital Signs: stable   Complications: No apparent anesthesia complications

## 2013-05-26 NOTE — Brief Op Note (Signed)
05/26/2013  6:08 PM  PATIENT:  Heather Gillespie  52 y.o. female  PRE-OPERATIVE DIAGNOSIS:  herniated disc L4 - L5 on the left and Lateral Recess Stenosis  POST-OPERATIVE DIAGNOSIS:  herniated disc L4-5 on the left and Lateral Recess stenosis  PROCEDURE:  Procedure(s): HEMI-MICRODISCECTOMY LUMBAR LAMINECTOMY L4 - L5 ON THE LEFT LEVEL 1 (Left) and Decompression of Lateral Recess.  SURGEON:  Surgeon(s) and Role:    * Jacki Cones, MD - Primary    * Drucilla Schmidt, MD - Assisting  :   ASSISTANTS:James Aplington MD   ANESTHESIA:   general  EBL:  Total I/O In: 1000 [I.V.:1000] Out: 50 [Blood:50]  BLOOD ADMINISTERED:none  DRAINS: none   LOCAL MEDICATIONS USED:  MARCAINE 20cc of 0.25% with Epinephrine.Exparel 20cc    SPECIMEN:  Source of Specimen:  L-4-L-5 space  DISPOSITION OF SPECIMEN:  PATHOLOGY  COUNTS:  YES  TOURNIQUET:  * No tourniquets in log *  DICTATION: .Other Dictation: Dictation Number [id number]  PLAN OF CARE: Admit for overnight observation  PATIENT DISPOSITION:  PACU - hemodynamically stable.   Delay start of Pharmacological VTE agent (>24hrs) due to surgical blood loss or risk of bleeding: yes

## 2013-05-26 NOTE — H&P (Signed)
Heather Gillespie is an 52 y.o. female.   Chief Complaint: back pain HPI: Heather Gillespie is a pleasant 52 year old female who has had pain in her back radiating into her left lower limb since February 2014. She stated that all she did was bend over to get her shoe and she has had pain ever since. Unfortunately, her symptoms have not gotten better. She has tried physical therapy. She has done oral steroids. She has tried multiple different pain medicines. She has tried ice packs, heating pad, over-the-counter rubs as well as creams. She did undergo a lumbar MRI. I have reviewed the films with the patient. This was done Feb 01, 2013 at [facility]. She has mild disc desiccation throughout the lumbar spine but the most significant finding is at L4-L5 where there is multifactorial central canal stenosis as well as lateral recess stenosis on the left. She has significant facet arthropathy at L4-L5 and L5-S1 bilaterally. She has pain radiating all the way into her left lower limb.She denies any bowel or bladder incontinence. No fever, chills, or night sweats. No unexplained weight loss. She has no history of any type of cancer.She has had ESI which did not help either. She had a CT myelogram which showed severe stenosis at L4-L5 with encroachment of the L5 nerve root on the left. In recent weeks, she has developed a partial foot drop on the left.   Past Medical History  Diagnosis Date  . Glaucoma   . GERD (gastroesophageal reflux disease)   . Hemorrhoids   . IBS (irritable bowel syndrome)   . Hiatal hernia   . Abdominal pain   . Sinus problem   . N&V (nausea and vomiting)   . Rectal bleeding   . Rectal ulcer   . Allergy   . Vitamin D deficiency   . TMJ (dislocation of temporomandibular joint)   . Osteoporosis     Past Surgical History  Procedure Laterality Date  . Colonoscopy w/ polypectomy      colon ulcers  . Cholecystectomy  1984  . Hemorroidectomy    . Anal fissure  repair    . Eye surgery      Family History  Problem Relation Age of Onset  . Diabetes Mother   . Stroke Father    Social History:  reports that she has never smoked. She has never used smokeless tobacco. She reports that she does not drink alcohol or use illicit drugs.  Allergies:  Allergies  Allergen Reactions  . Hydroxyzine Pamoate Rash    Current outpatient prescriptions: HYDROcodone-acetaminophen (NORCO) 7.5-325 MG per tablet, Take 1 tablet by mouth every 6 (six) hours as needed for pain., Disp: , Rfl: ;   calcium citrate-vitamin D (CITRACAL+D) 315-200 MG-UNIT per tablet, Take 1 tablet by mouth 2 (two) times daily., Disp: , Rfl:  carisoprodol (SOMA) 350 MG tablet, Take 1 tablet (350 mg total) by mouth 3 (three) times daily as needed for muscle spasms (stop flexeril)., Disp: 30 tablet, Rfl: 0;   cholecalciferol (VITAMIN D) 1000 UNITS tablet, Take 2,000 Units by mouth daily., Disp: , Rfl: ;   cyclobenzaprine (FLEXERIL) 10 MG tablet, Take 10 mg by mouth Nightly.  , Disp: , Rfl: ;   dexlansoprazole (DEXILANT) 60 MG capsule, Take 60 mg by mouth daily., Disp: , Rfl:  diclofenac (CATAFLAM) 50 MG tablet, Take 1 tablet (50 mg total) by mouth 3 (three) times daily. Take with food., Disp: 15 tablet, Rfl: 0;   fexofenadine (ALLEGRA) 180 MG  tablet, Take 1 tablet (180 mg total) by mouth daily., Disp: 30 tablet, Rfl: 1;   fluticasone (FLONASE) 50 MCG/ACT nasal spray, Place 2 sprays into the nose daily., Disp: 16 g, Rfl: 11;   gabapentin (NEURONTIN) 300 MG capsule, TAKE 1 CAPSULE THREE TIMES A DAY, Disp: 90 capsule, Rfl: 5 Olopatadine HCl (PATADAY) 0.2 % SOLN, Apply to eye daily.  , Disp: , Rfl:    Review of Systems  Constitutional: Negative for fever, chills, weight loss, malaise/fatigue and diaphoresis.  HENT: Negative.  Negative for neck pain.   Eyes: Negative.   Respiratory: Negative.   Cardiovascular: Negative.   Gastrointestinal: Negative.   Genitourinary: Negative.    Musculoskeletal: Positive for back pain and joint pain. Negative for myalgias and falls.  Skin: Negative.   Neurological: Positive for tingling, sensory change and weakness. Negative for tremors, speech change, focal weakness, seizures and loss of consciousness.  Endo/Heme/Allergies: Negative.   Psychiatric/Behavioral: Negative.    Vitals Weight: 123 lb Height: 62 in Body Surface Area: 1.56 m Body Mass Index: 22.5 kg/m Pain Level: 10/10 BP: 143/88 (Sitting, Left Arm, Standard)  Physical Exam  Constitutional: She is oriented to person, place, and time. She appears well-developed and well-nourished. No distress.  HENT:  Head: Normocephalic and atraumatic.  Right Ear: External ear normal.  Left Ear: External ear normal.  Nose: Nose normal.  Mouth/Throat: Oropharynx is clear and moist.  Eyes: Conjunctivae and EOM are normal.  Neck: Normal range of motion. Neck supple.  Cardiovascular: Normal rate, regular rhythm and intact distal pulses.   Murmur heard. Respiratory: Effort normal and breath sounds normal. No respiratory distress. She has no wheezes.  GI: Soft. Bowel sounds are normal. She exhibits no distension. There is no tenderness.  Musculoskeletal:       Right hip: Normal.       Left hip: Normal.       Right knee: Normal.       Left knee: Normal.       Lumbar back: She exhibits decreased range of motion, tenderness, pain and spasm.       Right lower leg: She exhibits no tenderness and no swelling.       Left lower leg: She exhibits no tenderness and no swelling.  She has painful range of motion of her back. She has a positive straight leg raise on the left. She is markedly weak in her dorsiflexors, toe extensors of the left foot. In a sitting position, her left foot turns inward, but she has had just about a complete footdrop on the left. Her right, she has had minimal weakness on the right, but nothing compared to the left  Neurological: She is alert and oriented  to person, place, and time. She has normal reflexes. A sensory deficit is present.  Skin: No rash noted. She is not diaphoretic. No erythema.  Psychiatric: She has a normal mood and affect. Her behavior is normal.     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 She needs a microdiscectomy and hemilaminectomy at L4-L5 on the left. She has a partial foot drop on the left, which is the most concerning factor at this time. Hopefully we will be able to get her some relief in regards to her back pain and her radicular left leg pain, but surgery is necessary in order to relieve the pressure on the L5 nerve root in order to get that nerve to function properly again. Risks and benefits discussed with the patient and  her family.   Tajah Noguchi LAUREN 05/26/2013, 11:26 AM

## 2013-05-26 NOTE — Transfer of Care (Signed)
Immediate Anesthesia Transfer of Care Note  Patient: Heather Gillespie  Procedure(s) Performed: Procedure(s): HEMI-MICRODISCECTOMY LUMBAR LAMINECTOMY L4 - L5 ON THE LEFT LEVEL 1 (Left)  Patient Location: PACU  Anesthesia Type:General  Level of Consciousness: awake, alert , oriented and patient cooperative  Airway & Oxygen Therapy: Patient Spontanous Breathing and Patient connected to face mask oxygen  Post-op Assessment: Report given to PACU RN and Post -op Vital signs reviewed and stable  Post vital signs: Reviewed and stable  Complications: No apparent anesthesia complications

## 2013-05-27 ENCOUNTER — Encounter (HOSPITAL_COMMUNITY): Payer: Self-pay | Admitting: Orthopedic Surgery

## 2013-05-27 MED ORDER — OXYCODONE HCL 5 MG PO TABS: 5.0000 mg | ORAL_TABLET | ORAL | Status: DC | PRN

## 2013-05-27 MED ORDER — PROMETHAZINE HCL 25 MG/ML IJ SOLN
6.2500 mg | Freq: Four times a day (QID) | INTRAMUSCULAR | Status: DC | PRN
  Administered 2013-05-27: 12:00:00 6.25 mg via INTRAVENOUS
  Filled 2013-05-27: qty 1

## 2013-05-27 MED ORDER — METHOCARBAMOL 500 MG PO TABS: 500.0000 mg | ORAL_TABLET | Freq: Four times a day (QID) | ORAL | Status: DC | PRN

## 2013-05-27 MED ORDER — GABAPENTIN 300 MG PO CAPS: 300.0000 mg | ORAL_CAPSULE | Freq: Three times a day (TID) | ORAL | Status: DC

## 2013-05-27 NOTE — Progress Notes (Signed)
Subjective: 1 Day Post-Op Procedure(s) (LRB): HEMI-MICRODISCECTOMY LUMBAR LAMINECTOMY L4 - L5 ON THE LEFT LEVEL 1 (Left) Patient reports pain as 4 on 0-10 scale. Doing better than she was pre-Op,but still has some leg pain as would be expected. Moves legs well.   Objective: Vital signs in last 24 hours: Temp:  [97.2 F (36.2 C)-98.6 F (37 C)] 98.4 F (36.9 C) (09/12 0504) Pulse Rate:  [74-100] 88 (09/12 0504) Resp:  [10-20] 16 (09/12 0504) BP: (119-139)/(74-87) 137/87 mmHg (09/12 0504) SpO2:  [96 %-100 %] 96 % (09/12 0504) Weight:  [55.974 kg (123 lb 6.4 oz)] 55.974 kg (123 lb 6.4 oz) (09/11 1312)  Intake/Output from previous day: 09/11 0701 - 09/12 0700 In: 2361.7 [P.O.:360; I.V.:1901.7; IV Piggyback:100] Out: 675 [Urine:625; Blood:50] Intake/Output this shift:     Recent Labs  05/26/13 1419  HGB 12.8    Recent Labs  05/26/13 1419  WBC 4.6  RBC 4.26  HCT 37.8  PLT 160    Recent Labs  05/26/13 1419  NA 138  K 3.7  CL 104  CO2 29  BUN 11  CREATININE 0.64  GLUCOSE 87  CALCIUM 9.7    Recent Labs  05/26/13 1419  INR 1.11    Dorsiflexion/Plantar flexion intact  Assessment/Plan: 1 Day Post-Op Procedure(s) (LRB): HEMI-MICRODISCECTOMY LUMBAR LAMINECTOMY L4 - L5 ON THE LEFT LEVEL 1 (Left) Up with therapy discontinued Today.  Raney Antwine A 05/27/2013, 7:15 AM

## 2013-05-27 NOTE — Care Management Note (Signed)
    Page 1 of 1   05/27/2013     11:44:04 AM   CARE MANAGEMENT NOTE 05/27/2013  Patient:  Heather Gillespie, Heather Gillespie   Account Number:  [id number]  Date Initiated:  05/27/2013  Documentation initiated by:  Colleen Can  Subjective/Objective Assessment:   dx spinal stenosis, HNP L4-L5-left; decopression, microdiskectomy     Action/Plan:   CM spoke with patient and family. Pt statesshe lives with her parents and they will be primary caregivers. She already has access to RW and commode seat. There are no HH services needs at this time.   Anticipated DC Date:  05/27/2013   Anticipated DC Plan:  HOME/SELF CARE      DC Planning Services  CM consult      Choice offered to / List presented to:             Status of service:  Completed, signed off Medicare Important Message given?   (If response is "NO", the following Medicare IM given date fields will be blank) Date Medicare IM given:   Date Additional Medicare IM given:    Discharge Disposition:  HOME/SELF CARE  Per UR Regulation:  Reviewed for med. necessity/level of care/duration of stay  If discussed at Long Length of Stay Meetings, dates discussed:    Comments:

## 2013-05-27 NOTE — Op Note (Signed)
NAMEJENEL, GIERKE            ACCOUNT NO.:  [id number]  MEDICAL RECORD NO.:  [id number]  LOCATION:  1605                         FACILITY:  [facility]  PHYSICIAN:  Georges Lynch. Brienne Liguori, M.D.DATE OF BIRTH:  15-Nov-1960  DATE OF PROCEDURE:  05/26/2013 DATE OF DISCHARGE:                              OPERATIVE REPORT   SURGEON:  Georges Lynch. Darrelyn Hillock, M.D.  ASSISTANT:  Marlowe Kays, M.D.  PREOPERATIVE DIAGNOSES: 1. Severe lateral recess stenosis at L4-L5 on the left with nerve root     cut off, proven on myelogram. 2. Herniated lumbar disk at L4-L5 on the left.  POSTOPERATIVE DIAGNOSES: 1. Severe lateral recess stenosis at L4-L5 on the left with nerve root     cut off, proven on myelogram. 2. Herniated lumbar disk at L4-L5 on the left.  OPERATION: 1. Decompression of L4-L5 for severe lateral recess stenosis. 2. Microdiskectomy at L4-L5 for herniated lumbar disk.  DESCRIPTION OF PROCEDURE:  Under general anesthesia, routine orthopedic prep and draping of the lower back was carried out.  Appropriate time- out was first carried out.  I also marked the appropriate left side in the holding area.  At this time, 2 needles were placed in the back for localization purposes, and an x-ray was ordered.  Following that, an incision was made over the L4-L5 interspace.  Bleeders were identified and cauterized.  I then went down and identified the L4 spinous process. I separated the muscle from the lamina bilaterally and the spinous process.  Kocher clamp was placed on the spinous process, and a Penfield 4 into the disk space area.  An x-ray was taken to verify the position. We then carried out our hemilaminectomy in the usual fashion.  We had to go far out laterally to decompress the L5 root, which definitely had a kink in it.  Once we identified the root, freed the root up, we noted there was a marked thickening of the ligamentum flavum in this area.  We had to completely clear that out in the  lateral recess.  We had cauterized lateral recess veins with a bipolar.  We did a nice foraminotomy for the L5 root as well.  Following that, a needle was placed in the disk space.  X-ray was taken to verify the position at that time.  I then made a cruciate incision in the posterior longitudinal ligament and did a microdiskectomy and the specimen was sent to the lab.  I utilized the Epstein curettes to go medially, laterally, superiorly, and inferiorly, and also remove any other fragment.  We did a nice foraminotomy for the 5 root.  I was now easily able to pass a hockey-stick proximally, distally and out the L4 root foramen and also the L5 root foramen.  I thoroughly irrigated out the area.  We had a nice decompression.  The nerve root now were free.  I loosely applied some thrombin-soaked Gelfoam and closed the wound in layers in the usual fashion.  I left a small deep distal and proximal part of the wound open for drainage purposes.  The subcu was closed after I injected 20 mL of Exparel into the muscle and soft tissue.  Note, prior to surgery, I  injected 20 mL of 0.25% Marcaine with epinephrine into the flap tissue to control the bleeding.  The subcu was closed with #1 Vicryl, skin with metal staples.  Sterile Neosporin dressing was applied.  She had 2 g of IV Ancef preop.          ______________________________ Georges Lynch. Darrelyn Hillock, M.D.     RAG/MEDQ  D:  05/26/2013  T:  05/27/2013  Job:  [id number]

## 2013-05-27 NOTE — Evaluation (Signed)
Physical Therapy Evaluation Patient Details Name: Heather Gillespie MRN: [id number] DOB: 03/10/1961 Today's Date: 05/27/2013 Time: 4098-1191 PT Time Calculation (min): 23 min  PT Assessment / Plan / Recommendation History of Present Illness  Decompression of L4-L5 for severe lateral recess stenosis.  Clinical Impression  *Pt independent with bed mobility, transfers, and walking. She ambulated 50' without assistive device, no LOB. Instructed pt in back precautions, encouraged frequent ambulation. Ready to DC home from PT standpoint. NO f/u PT, nor DME recommended. **    PT Assessment  Patent does not need any further PT services    Follow Up Recommendations  No PT follow up    Does the patient have the potential to tolerate intense rehabilitation      Barriers to Discharge        Equipment Recommendations  None recommended by PT    Recommendations for Other Services     Frequency      Precautions / Restrictions Precautions Precautions: Back Precaution Booklet Issued: Yes (comment) Restrictions Weight Bearing Restrictions: No   Pertinent Vitals/Pain *10/10 back RN notified**      Mobility  Bed Mobility Bed Mobility: Rolling Right;Right Sidelying to Sit Rolling Right: 7: Independent Right Sidelying to Sit: 7: Independent;HOB flat Transfers Transfers: Sit to Stand;Stand to Sit Sit to Stand: 6: Modified independent (Device/Increase time);From bed;From toilet;With upper extremity assist Stand to Sit: 6: Modified independent (Device/Increase time);To chair/3-in-1;To toilet;With upper extremity assist Ambulation/Gait Ambulation/Gait Assistance: 7: Independent Ambulation Distance (Feet): 90 Feet Assistive device: None Gait Pattern: Decreased step length - right;Decreased step length - left;Within Functional Limits Gait velocity: decreased    Exercises     PT Diagnosis:    PT Problem List:   PT Treatment Interventions:       PT Goals(Current goals can be found  in the care plan section)    Visit Information  Last PT Received On: 05/27/13 Assistance Needed: +1 PT/OT Co-Evaluation/Treatment: Yes History of Present Illness: Decompression of L4-L5 for severe lateral recess stenosis.       Prior Functioning  Home Living Family/patient expects to be discharged to:: Private residence Living Arrangements: Other relatives (lives with parents) Available Help at Discharge: Family;Available 24 hours/day Home Access: Stairs to enter [employer] of Steps: 3 Entrance Stairs-Rails: Can reach both;Left;Right Home Layout: One level Home Equipment: Cane - single point;Walker - 4 wheels;Walker - 2 wheels;Bedside commode;Shower seat;Grab bars - tub/shower Additional Comments: tub shower Prior Function Level of Independence: Independent Communication Communication: No difficulties    Cognition  Cognition Arousal/Alertness: Awake/alert Behavior During Therapy: WFL for tasks assessed/performed Overall Cognitive Status: Within Functional Limits for tasks assessed    Extremity/Trunk Assessment     Balance Balance Balance Assessed: Yes Static Sitting Balance Static Sitting - Balance Support: No upper extremity supported;Feet supported Static Sitting - Level of Assistance: 7: Independent Static Sitting - Comment/# of Minutes: 2  End of Session PT - End of Session Equipment Utilized During Treatment: Gait belt Activity Tolerance: Patient tolerated treatment well Patient left: in chair;with call bell/phone within reach Nurse Communication: Mobility status  GP Functional Assessment Tool Used: clinical judgement Functional Limitation: Mobility: Walking and moving around Mobility: Walking and Moving Around Current Status [phone]): 0 percent impaired, limited or restricted Mobility: Walking and Moving Around Goal Status [phone]): 0 percent impaired, limited or restricted Mobility: Walking and Moving Around Discharge Status [phone]): 0 percent  impaired, limited or restricted   Tamala Ser 05/27/2013, 10:28 AM [phone]

## 2013-05-27 NOTE — Discharge Summary (Signed)
Physician Discharge Summary  Patient ID: Heather Gillespie MRN: [id number] DOB/AGE: [birth year] 53 y.o.  Admit date: 05/26/2013 Discharge date: 05/27/2013  Admission Diagnoses:Spinal Stenosis and Herniated Lumbar Disc.  Discharge Diagnoses:Spinal Stenosis and Herniated Lumbar Disc.  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  Hospital Course: Up with PT  Consults: rehabilitation medicine  Significant Diagnostic Studies: Xrays in OR.  Treatments: PT  Discharge Exam: Blood pressure 137/87, pulse 88, temperature 98.4 F (36.9 C), temperature source Oral, resp. rate 16, height 5\' 1"  (1.549 m), weight 55.974 kg (123 lb 6.4 oz), SpO2 96.00%. Extremities: Homans sign is negative, no sign of DVT and Good function in Legs  Disposition: 01-Home or Self Care   Future Appointments Provider Department Dept Phone   06/13/2013 9:15 AM [facility] [place] [facility] [phone]   Patient should wear two piece clothing and wear no powder or deodorant. Patient should arrive 15 minutes early.       Medication List    ASK your doctor about these medications       DEXILANT 60 MG capsule  Generic drug:  dexlansoprazole  Take 60 mg by mouth Gillespie.     diclofenac 50 MG tablet  Commonly known as:  CATAFLAM  Take 50 mg by mouth 3 (three) times Gillespie as needed (for pain).     fexofenadine 180 MG tablet  Commonly known as:  ALLEGRA  Take 180 mg by mouth Gillespie as needed (for allergies).     fluticasone 50 MCG/ACT nasal spray  Commonly known as:  FLONASE  Place 2 sprays into the nose Gillespie as needed for rhinitis.     gabapentin 300 MG capsule  Commonly known as:  NEURONTIN  Take 300 mg by mouth at bedtime as needed (for nerve pain).     HYDROcodone-acetaminophen 7.5-325 MG per tablet  Commonly known as:  NORCO  Take 1 tablet by  mouth every 6 (six) hours as needed for pain.     PATADAY 0.2 % Soln  Generic drug:  Olopatadine HCl  Place 1 drop into both eyes Gillespie.         Signed: Blakely Gluth A 05/27/2013, 7:17 AM

## 2013-05-27 NOTE — Evaluation (Signed)
Occupational Therapy Evaluation Patient Details Name: Heather Gillespie MRN: [id number] DOB: [birth year] Today's Date: 05/27/2013 Time: 2952-8413 OT Time Calculation (min): 23 min  OT Assessment / Plan / Recommendation History of present illness Decompression of L4-L5 for severe lateral recess stenosis.   Clinical Impression   Pt presents to  OT s/p back surgery. Pt overall S with ADL activity and family will A as needed.  Education regarding  ADL activity and back precautions complete.   OT Assessment  Patient does not need any further OT services    Follow Up Recommendations  No OT follow up                Precautions / Restrictions Precautions Precautions: Back Precaution Booklet Issued: Yes (comment) Restrictions Weight Bearing Restrictions: No       ADL  Grooming: Performed;Wash/dry face Where Assessed - Grooming: Supported sitting Upper Body Dressing: Performed;Set up Where Assessed - Upper Body Dressing: Unsupported sitting Lower Body Dressing: Performed;Supervision/safety Where Assessed - Lower Body Dressing: Unsupported sit to stand Toilet Transfer: Performed;Supervision/safety Toilet Transfer Method: Sit to Barista: Comfort height toilet Toileting - Clothing Manipulation and Hygiene: Performed;Supervision/safety Where Assessed - Engineer, mining and Hygiene: Standing Transfers/Ambulation Related to ADLs: Educated in ADL activity with back precautions. Pts parents will help pt with ADL activity as needed     Acute Rehab OT Goals Patient Stated Goal: get my back in shape OT Goal Formulation: With patient  Visit Information  Last OT Received On: 05/27/13 Assistance Needed: +1 History of Present Illness: Decompression of L4-L5 for severe lateral recess stenosis.       Prior Functioning     Home Living Family/patient expects to be discharged to:: Private residence Living Arrangements: Other relatives (lives with  parents) Available Help at Discharge: Family;Available 24 hours/day Home Access: Stairs to enter [employer] of Steps: 3 Entrance Stairs-Rails: Can reach both;Left;Right Home Layout: One level Home Equipment: Cane - single point;Walker - 4 wheels;Walker - 2 wheels;Bedside commode;Shower seat;Grab bars - tub/shower Additional Comments: tub shower Prior Function Level of Independence: Independent Communication Communication: No difficulties         Vision/Perception Vision - History Patient Visual Report: No change from baseline   Cognition  Cognition Arousal/Alertness: Awake/alert Behavior During Therapy: WFL for tasks assessed/performed Overall Cognitive Status: Within Functional Limits for tasks assessed    Extremity/Trunk Assessment Upper Extremity Assessment Upper Extremity Assessment: Overall WFL for tasks assessed     Mobility Bed Mobility Bed Mobility: Rolling Right;Right Sidelying to Sit Rolling Right: 7: Independent Right Sidelying to Sit: 7: Independent;HOB flat Transfers Sit to Stand: 6: Modified independent (Device/Increase time);From bed;From toilet;With upper extremity assist Stand to Sit: 6: Modified independent (Device/Increase time);To chair/3-in-1;To toilet;With upper extremity assist        Balance Balance Balance Assessed: Yes Static Sitting Balance Static Sitting - Balance Support: No upper extremity supported;Feet supported Static Sitting - Level of Assistance: 7: Independent Static Sitting - Comment/# of Minutes: 2   End of Session OT - End of Session Equipment Utilized During Treatment: Gait belt Activity Tolerance: Patient tolerated treatment well Patient left: in chair  GO Functional Assessment Tool Used: clinical observation Functional Limitation: Self care Self Care Current Status (K4401): At least 1 percent but less than 20 percent impaired, limited or restricted Self Care Goal Status (U2725): 0 percent impaired, limited  or restricted Self Care Discharge Status [phone]): At least 1 percent but less than 20 percent impaired, limited or restricted  Alba Cory 05/27/2013, 10:34 AM

## 2013-05-28 NOTE — Progress Notes (Signed)
Orthopedics Progress Note  Subjective: I feel better today.  I am ready to go home.  Objective:  Filed Vitals:   05/28/13 0615  BP: 111/67  Pulse: 90  Temp: 98.3 F (36.8 C)  Resp: 16    General: Awake and alert  Musculoskeletal: wound CDI, no erythema LE motor grade 5 bilaterally Neurovascularly intact  Lab Results  Component Value Date   WBC 4.6 05/26/2013   HGB 12.8 05/26/2013   HCT 37.8 05/26/2013   MCV 88.7 05/26/2013   PLT 160 05/26/2013       Component Value Date/Time   NA 138 05/26/2013 1419   K 3.7 05/26/2013 1419   CL 104 05/26/2013 1419   CO2 29 05/26/2013 1419   GLUCOSE 87 05/26/2013 1419   BUN 11 05/26/2013 1419   CREATININE 0.64 05/26/2013 1419   CALCIUM 9.7 05/26/2013 1419   GFRNONAA >90 05/26/2013 1419   GFRAA >90 05/26/2013 1419    Lab Results  Component Value Date   INR 1.11 05/26/2013    Assessment/Plan: POD #2 s/p Procedure(s): HEMI-MICRODISCECTOMY LUMBAR LAMINECTOMY L4 - L5 ON THE LEFT LEVEL 1 D/C home  Almedia Balls. Ranell Patrick, MD 05/28/2013 11:02 AM

## 2013-05-28 NOTE — Progress Notes (Signed)
Discharged from floor via w/c, family with pt. No changes in assessment. Heather Gillespie  

## 2013-05-30 NOTE — Discharge Summary (Signed)
Physician Discharge Summary   Patient ID: [name] MRN: [id number] DOB/AGE: Jul 05, 1961 52 y.o.  Admit date: 05/26/2013 Discharge date: 05/28/2013  Primary Diagnosis: Spinal stenosis, lumbar spine             Lumbar disc herniation  Admission Diagnoses:  Past Medical History  Diagnosis Date  . Glaucoma   . GERD (gastroesophageal reflux disease)   . Hemorrhoids   . IBS (irritable bowel syndrome)   . Hiatal hernia   . Abdominal pain   . Sinus problem   . N&V (nausea and vomiting)   . Rectal bleeding   . Rectal ulcer   . Allergy   . Vitamin D deficiency   . TMJ (dislocation of temporomandibular joint)   . 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   Weight as of this encounter: 55.974 kg (123 lb 6.4 oz).  Procedure:  Procedure(s) (LRB): HEMI-MICRODISCECTOMY LUMBAR LAMINECTOMY L4 - L5 ON THE LEFT LEVEL 1 (Left)   Consults: None  HPI: [name] is Gillespie pleasant 52 year old female who has had pain in her back radiating into her left lower limb since February 2014. She stated that all she did was bend over to get her shoe and she has had pain ever since. Unfortunately, her symptoms have not gotten better. She has tried physical therapy. She has done oral steroids. She has tried multiple different pain medicines. She has tried ice packs, heating pad, over-the-counter rubs as well as creams. She did undergo Gillespie lumbar MRI. I have reviewed the films with the patient. This was done Feb 01, 2013 at [facility]. She has mild disc desiccation throughout the lumbar spine but the most significant finding is at L4-L5 where there is multifactorial central canal stenosis as well as lateral recess stenosis on the  left. She has significant facet arthropathy at L4-L5 and L5-S1 bilaterally. She has pain radiating all the way into her left lower limb. She denies any bowel or bladder incontinence. No fever, chills, or night sweats. No unexplained weight loss. She has no history of any type of cancer. She has had ESI which did not help either. She had Gillespie CT myelogram which showed severe stenosis at L4-L5 with encroachment of the L5 nerve root on the left. In recent weeks, she has developed Gillespie partial foot drop on the left.   Laboratory Data: Admission on 05/26/2013, Discharged on 05/28/2013  Component Date Value Range Status  . Sodium 05/26/2013 138  135 - 145 mEq/L Final  . Potassium 05/26/2013 3.7  3.5 - 5.1 mEq/L Final  . Chloride 05/26/2013 104  96 - 112 mEq/L Final  . CO2 05/26/2013 29  19 - 32 mEq/L Final  . Glucose, Bld 05/26/2013 87  70 - 99 mg/dL Final  . BUN 56/21/3086 11  6 - 23 mg/dL Final  . Creatinine, Ser 05/26/2013 0.64  0.50 - 1.10 mg/dL Final  . Calcium 57/84/6962 9.7  8.4 - 10.5 mg/dL Final  . Total Protein 05/26/2013 6.9  6.0 - 8.3 g/dL Final  . Albumin 95/28/4132 3.9  3.5 - 5.2 g/dL Final  . AST 44/09/270 15  0 - 37 U/L Final  . ALT 05/26/2013 9  0 - 35 U/L Final  .  Alkaline Phosphatase 05/26/2013 57  39 - 117 U/L Final  . Total Bilirubin 05/26/2013 0.6  0.3 - 1.2 mg/dL Final  . GFR calc non Af Amer 05/26/2013 >90  >90 mL/min Final  . GFR calc Af Amer 05/26/2013 >90  >90 mL/min Final   Comment: (NOTE)                          The eGFR has been calculated using the CKD EPI equation.                          This calculation has not been validated in all clinical situations.                          eGFR's persistently <90 mL/min signify possible Chronic Kidney                          Disease.  Marland Kitchen Prothrombin Time 05/26/2013 14.1  11.6 - 15.2 seconds Final  . INR 05/26/2013 1.11  0.00 - 1.49 Final  . aPTT 05/26/2013 28  24 - 37 seconds Final  . Color, Urine 05/26/2013 YELLOW  YELLOW  Final  . APPearance 05/26/2013 CLEAR  CLEAR Final  . Specific Gravity, Urine 05/26/2013 1.022  1.005 - 1.030 Final  . pH 05/26/2013 7.5  5.0 - 8.0 Final  . Glucose, UA 05/26/2013 NEGATIVE  NEGATIVE mg/dL Final  . Hgb urine dipstick 05/26/2013 NEGATIVE  NEGATIVE Final  . Bilirubin Urine 05/26/2013 NEGATIVE  NEGATIVE Final  . Ketones, ur 05/26/2013 NEGATIVE  NEGATIVE mg/dL Final  . Protein, ur 45/40/9811 NEGATIVE  NEGATIVE mg/dL Final  . Urobilinogen, UA 05/26/2013 0.2  0.0 - 1.0 mg/dL Final  . Nitrite 91/47/8295 NEGATIVE  NEGATIVE Final  . Leukocytes, UA 05/26/2013 NEGATIVE  NEGATIVE Final   MICROSCOPIC NOT DONE ON URINES WITH NEGATIVE PROTEIN, BLOOD, LEUKOCYTES, NITRITE, OR GLUCOSE <1000 mg/dL.  . WBC 05/26/2013 4.6  4.0 - 10.5 K/uL Final  . RBC 05/26/2013 4.26  3.87 - 5.11 MIL/uL Final  . Hemoglobin 05/26/2013 12.8  12.0 - 15.0 g/dL Final  . HCT 62/13/0865 37.8  36.0 - 46.0 % Final  . MCV 05/26/2013 88.7  78.0 - 100.0 fL Final  . MCH 05/26/2013 30.0  26.0 - 34.0 pg Final  . MCHC 05/26/2013 33.9  30.0 - 36.0 g/dL Final  . RDW 78/46/9629 13.1  11.5 - 15.5 % Final  . Platelets 05/26/2013 160  150 - 400 K/uL Final  . MRSA, PCR 05/26/2013 POSITIVE* NEGATIVE Final   Comment: RESULT CALLED TO, READ BACK BY AND VERIFIED WITH:                          SANDY SCARBORO,RN [id number] @ 1608 BY J SCOTTON  . Staphylococcus aureus 05/26/2013 POSITIVE* NEGATIVE Final   Comment:                                 The Xpert SA Assay (FDA                          approved for NASAL specimens  in patients over 83 years of age),                          is one component of                          Gillespie comprehensive surveillance                          program.  Test performance has                          been validated by [employer] for patients greater                          than or equal to 53 year old.                          It is not intended                           to diagnose infection nor to                          guide or monitor treatment.                          RESULT CALLED TO, READ BACK BY AND VERIFIED WITH:                          Olen Cordial [id number] @ 1608 BY J SCOTTON     X-Rays:Dg Chest 2 View  05/26/2013   *RADIOLOGY REPORT*  Clinical Data: Preoperative evaluation for lumbar surgery.  CHEST - 2 VIEW  Comparison: None.  Findings: The heart and pulmonary vascularity are within normal limits.  The lungs are clear bilaterally.  No acute bony abnormality is seen.  IMPRESSION: No acute abnormality noted.   Original Report Authenticated By: Alcide Clever, M.D.   Dg Lumbar Spine 2-3 Views  05/26/2013   *RADIOLOGY REPORT*  Clinical Data: Preoperative evaluation for lumbar surgery.  LUMBAR SPINE - 2-3 VIEW  Comparison: 05/23/2010  Findings: Five lumbar type vertebral bodies are well visualized. Vertebral body height is well-maintained.  Mild disc space narrowing is noted at L4-5.  IMPRESSION: No acute abnormality noted.  The spinal numbering is similar in nomenclature to that seen on prior MRI and myelogram.   Original Report Authenticated By: Alcide Clever, M.D.   Ct Lumbar Spine W Contrast  05/23/2013   *RADIOLOGY REPORT*  Clinical Data:  Back pain  CT MYELOGRAPHY LUMBAR SPINE  Technique:  CT imaging of the lumbar spine was performed after intrathecal contrast administration.  Multiplanar CT image reconstructions were also generated.  Comparison:  02/11/2013  Findings: Radiologist injection.  Anatomic alignment.  No vertebral compression deformity.  No pars defect.  Conus medullaris terminates at L1.  Visualized kidneys and aorta are unremarkable.  L1-2:  Unremarkable.  L2-3:  Unremarkable.  L3-4:  Unremarkable.  L4-5:  Short pedicles.  Mild ligamentum flavum hypertrophy. Concentric bulge asymmetric to  the left lateral recess and left foraminal locations resulting and left L5 nerve root encroachment. Mild narrowing of the left foramen.   Mild narrowing of the right lateral recess.  Mild facet arthropathy contributes to lateral recess narrowing.  L5-S1:  Mild right-sided facet arthropathy.  Small focal central disc protrusion.  No neural compromise.  IMPRESSION: Degenerative disc disease at L4-5 resulting and left lateral recess and left foraminal narrowing.  Left L5 nerve root encroachment is noted.  There is also mild narrowing of the right lateral recess.  Small central disc protrusion at L5-S1.  Right-sided facet arthropathy at this level.   Original Report Authenticated By: Jolaine Click, M.D.   Dg Myelogram Lumbar  05/23/2013   *RADIOLOGY REPORT*  Clinical Data:  Back pain  MYELOGRAM LUMBAR  Technique: The procedure, risks, benefits, and alternatives were explained to the patient. The patient understands and consents. Under fluoroscopic guidance, Gillespie 22 gauge spinal needle was placed in the CSF space via left L2-3 approach. 20 mL of Omnipaque 180 was injected.  Findings: There is left lateral recess narrowing at L4-5 with effacement of the left L5 nerve root sleeve.  No vertebral compression deformity.  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  Complications: None.  Fluoroscopy Time: 38 seconds.  IMPRESSION: Left L4-5 lateral recess narrowing   Original Report Authenticated By: Jolaine Click, M.D.   Dg Spine Portable 1 View  05/26/2013   CLINICAL DATA:  L4-L5 microdiskectomy  EXAM: PORTABLE SPINE - 1 VIEW  COMPARISON:  Portable exam labeled #3 at 1740 hr compared to earlier studies of 05/26/2013  FINDINGS: Five lumbar vertebrae labeled on prior exams.  Metallic probe via posterior approach projects over the posterior half of the L4-L5 disc space level.  Tissue spreader is seen dorsally at the L4 and L5 levels.  IMPRESSION: Posterior localization of the L4-L5 disc space level.   Electronically Signed   By: Ulyses Southward M.D.   On: 05/26/2013 17:54   Dg Spine Portable 1 View  05/26/2013   CLINICAL DATA:  L4-L5  microdiscectomy  EXAM: PORTABLE SPINE - 1 VIEW  COMPARISON:  Portable intraoperative cross-table lateral view #2 at 1705 hr compared to 1650 hr.  FINDINGS: Five lumbar vertebrae labeled on prior exam.  Metallic probe and additional surgical instrument via posterior approach project dorsal to the L4-L5 disc space level.  IMPRESSION: Posterior localization of the L4-L5 disc space level.   Electronically Signed   By: Ulyses Southward M.D.   On: 05/26/2013 17:31   Dg Spine Portable 1 View  05/26/2013   *RADIOLOGY REPORT*  Clinical Data: L4-5 microdiskectomy  PORTABLE SPINE - 1 VIEW  Comparison: Film from earlier in the same day.  Findings: Five lumbar type vertebral bodies are seen.  There are small needles identified in the posterior soft tissues at the L4-5 level and just below at the interspinous space at L4-5.   Original Report Authenticated By: Alcide Clever, M.D.    EKG: Orders placed during the hospital encounter of 05/26/13  . EKG 12-LEAD  . EKG     Hospital Course: OLETTA BUEHRING is Gillespie 52 y.o. who was admitted to [facility]. They were brought to the operating room on 05/26/2013 and underwent Procedure(s): HEMI-MICRODISCECTOMY LUMBAR LAMINECTOMY L4 - L5 ON THE LEFT LEVEL 1.  Patient tolerated the procedure well and was later transferred to the recovery room and then to the orthopaedic floor for postoperative care.  They were given PO and  IV analgesics for pain control following their surgery.  They were given 24 hours of postoperative antibiotics of  Anti-infectives   Start     Dose/Rate Route Frequency Ordered Stop   05/26/13 2200  ceFAZolin (ANCEF) IVPB 1 g/50 mL premix     1 g 100 mL/hr over 30 Minutes Intravenous 3 times per day 05/26/13 1933 05/27/13 1355   05/26/13 1704  polymyxin B 500,000 Units, bacitracin 50,000 Units in sodium chloride irrigation 0.9 % 500 mL irrigation  Status:  Discontinued       As needed 05/26/13 1704 05/26/13 1817   05/26/13 1630  vancomycin (VANCOCIN)  IVPB 1000 mg/200 mL premix     1,000 mg 200 mL/hr over 60 Minutes Intravenous  Once 05/26/13 1628 05/26/13 1641   05/26/13 1345  ceFAZolin (ANCEF) IVPB 2 g/50 mL premix  Status:  Discontinued     2 g 100 mL/hr over 30 Minutes Intravenous 30 min pre-op 05/26/13 1344 05/26/13 1628     and started on DVT prophylaxis in the form of Aspirin.   PT was ordered.  Discharge planning consulted to help with postop disposition and equipment needs.  Patient had Gillespie difficult night on the evening of surgery due to nausea and vomiting.  They started to get up OOB with therapy on day one. She continued to have issues with vomiting as well as urinary retention on post op day 1. Post op day 2 she reported feeling much better and had voided.  Patient was seen in rounds and was ready to go home.   Discharge Medications: Prior to Admission medications   Medication Sig Start Date End Date Taking? Authorizing Provider  dexlansoprazole (DEXILANT) 60 MG capsule Take 60 mg by mouth daily.   Yes Historical Provider, MD  diclofenac (CATAFLAM) 50 MG tablet Take 50 mg by mouth 3 (three) times daily as needed (for pain).   Yes Historical Provider, MD  fexofenadine (ALLEGRA) 180 MG tablet Take 180 mg by mouth daily as needed (for allergies).   Yes Historical Provider, MD  fluticasone (FLONASE) 50 MCG/ACT nasal spray Place 2 sprays into the nose daily as needed for rhinitis.   Yes Historical Provider, MD  Olopatadine HCl (PATADAY) 0.2 % SOLN Place 1 drop into both eyes daily.    Yes Historical Provider, MD  gabapentin (NEURONTIN) 300 MG capsule Take 1 capsule (300 mg total) by mouth 3 (three) times daily. 05/27/13   Collier Bohnet Tamala Ser, PA-C  methocarbamol (ROBAXIN) 500 MG tablet Take 1 tablet (500 mg total) by mouth every 6 (six) hours as needed. 05/27/13   Safia Panzer Tamala Ser, PA-C  oxyCODONE (ROXICODONE) 5 MG immediate release tablet Take 1-2 tablets (5-10 mg total) by mouth every 4 (four) hours as needed for pain. 05/27/13    Tyress Loden Tamala Ser, PA-C    Diet: Regular diet Activity:WBAT Follow-up:in 2 weeks Disposition - Home Discharged Condition: stable   Discharge Orders   Future Appointments Provider Department Dept Phone   06/13/2013 9:15 AM [facility] [place] [facility] [phone]   Patient should wear two piece clothing and wear no powder or deodorant. Patient should arrive 15 minutes early.   Future Orders Complete By Expires   Call MD / Call 911  As directed    Comments:     If you experience chest pain or shortness of breath, CALL 911 and be transported to the hospital emergency room.  If you develope Gillespie fever above 101 F, pus (white drainage) or increased  drainage or redness at the wound, or calf pain, call your surgeon's office.   Constipation Prevention  As directed    Comments:     Drink plenty of fluids.  Prune juice may be helpful.  You may use Gillespie stool softener, such as Colace (over the counter) 100 mg twice Gillespie day.  Use MiraLax (over the counter) for constipation as needed.   Diet general  As directed    Discharge instructions  As directed    Comments:     Change your dressing daily. Shower only, no tub bath. Call if any temperatures greater than 101 or any wound complications: [phone] during the day and ask for Dr. Jeannetta Ellis nurse, Mackey Birchwood.   Driving restrictions  As directed    Comments:     No driving for 2 weeks   Increase activity slowly as tolerated  As directed    Lifting restrictions  As directed    Comments:     No lifting       Medication List    STOP taking these medications       HYDROcodone-acetaminophen 7.5-325 MG per tablet  Commonly known as:  NORCO      TAKE these medications       DEXILANT 60 MG capsule  Generic drug:  dexlansoprazole  Take 60 mg by mouth daily.     diclofenac 50 MG tablet  Commonly known as:  CATAFLAM  Take 50 mg by mouth 3 (three) times daily as needed (for pain).     fexofenadine 180 MG tablet    Commonly known as:  ALLEGRA  Take 180 mg by mouth daily as needed (for allergies).     fluticasone 50 MCG/ACT nasal spray  Commonly known as:  FLONASE  Place 2 sprays into the nose daily as needed for rhinitis.     gabapentin 300 MG capsule  Commonly known as:  NEURONTIN  Take 1 capsule (300 mg total) by mouth 3 (three) times daily.     methocarbamol 500 MG tablet  Commonly known as:  ROBAXIN  Take 1 tablet (500 mg total) by mouth every 6 (six) hours as needed.     oxyCODONE 5 MG immediate release tablet  Commonly known as:  ROXICODONE  Take 1-2 tablets (5-10 mg total) by mouth every 4 (four) hours as needed for pain.     PATADAY 0.2 % Soln  Generic drug:  Olopatadine HCl  Place 1 drop into both eyes daily.           Follow-up Information   Follow up with Heather Gillespie,Heather A, MD. Schedule an appointment as soon as possible for Gillespie visit in 2 weeks.   Specialty:  Orthopedic Surgery   Contact information:   [address] [place] [address] [phone]       Signed: [facility] 05/30/2013, 7:02 AM

## 2013-06-13 ENCOUNTER — Ambulatory Visit (HOSPITAL_COMMUNITY)
Admission: RE | Admit: 2013-06-13 | Discharge: 2013-06-13 | Disposition: A | Discharge status: Home / Self Care | Payer: PRIVATE HEALTH INSURANCE | Source: Ambulatory Visit | Attending: Internal Medicine | Admitting: Internal Medicine

## 2013-06-13 DIAGNOSIS — Z1231 Encounter for screening mammogram for malignant neoplasm of breast: Secondary | ICD-10-CM | POA: Insufficient documentation

## 2013-06-28 ENCOUNTER — Other Ambulatory Visit: Payer: Self-pay | Admitting: Family Medicine

## 2013-09-22 ENCOUNTER — Ambulatory Visit (INDEPENDENT_AMBULATORY_CARE_PROVIDER_SITE_OTHER): Payer: Medicaid Other | Admitting: Family Medicine

## 2013-09-22 ENCOUNTER — Encounter: Payer: Self-pay | Admitting: Family Medicine

## 2013-09-22 VITALS — BP 140/90 | HR 80 | Temp 98.0°F | Resp 14 | Ht 62.5 in | Wt 128.0 lb

## 2013-09-22 DIAGNOSIS — J029 Acute pharyngitis, unspecified: Secondary | ICD-10-CM

## 2013-09-22 LAB — RAPID STREP SCREEN (MED CTR MEBANE ONLY): Streptococcus, Group A Screen (Direct): NEGATIVE

## 2013-09-22 NOTE — Progress Notes (Signed)
Subjective:    Patient ID: Heather Gillespie, female    DOB: 24-May-1961, 53 y.o.   MRN: [id number]  HPI Patient presents with a one-week history of sore throat. She denies any fevers. She denies any rhinorrhea. She denies any otalgia. She denies any cough or shortness of breath. Strep screen in the office today is negative. Past Medical History  Diagnosis Date  . Glaucoma   . GERD (gastroesophageal reflux disease)   . Hemorrhoids   . IBS (irritable bowel syndrome)   . Hiatal hernia   . Abdominal pain   . Sinus problem   . N&V (nausea and vomiting)   . Rectal bleeding   . Rectal ulcer   . Allergy   . Vitamin D deficiency   . TMJ (dislocation of temporomandibular joint)   . Osteoporosis    Current Outpatient Prescriptions on File Prior to Visit  Medication Sig Dispense Refill  . dexlansoprazole (DEXILANT) 60 MG capsule Take 60 mg by mouth daily.      . fexofenadine (ALLEGRA) 180 MG tablet TAKE 1 TABLET (180 MG TOTAL) BY MOUTH DAILY.  30 tablet  1  . fluticasone (FLONASE) 50 MCG/ACT nasal spray Place 2 sprays into the nose daily as needed for rhinitis.      Marland Kitchen gabapentin (NEURONTIN) 300 MG capsule Take 1 capsule (300 mg total) by mouth 3 (three) times daily.  30 capsule  0  . Olopatadine HCl (PATADAY) 0.2 % SOLN Place 1 drop into both eyes daily.        No current facility-administered medications on file prior to visit.   Allergies  Allergen Reactions  . Hydroxyzine Pamoate Rash   History   Social History  . Marital Status: Single    Spouse Name: N/A    Number of Children: N/A  . Years of Education: N/A   Occupational History  . Not on file.   Social History Main Topics  . Smoking status: Never Smoker   . Smokeless tobacco: Never Used  . Alcohol Use: No  . Drug Use: No  . Sexual Activity: Not on file   Other Topics Concern  . Not on file   Social History Narrative  . No narrative on file      Review of Systems  All other systems reviewed and are  negative.       Objective:   Physical Exam  Vitals reviewed. Constitutional: She appears well-developed and well-nourished. No distress.  HENT:  Head: Normocephalic and atraumatic.  Right Ear: External ear normal.  Left Ear: External ear normal.  Nose: Nose normal.  Mouth/Throat: Oropharynx is clear and moist. No oropharyngeal exudate.  Eyes: Conjunctivae and EOM are normal. Pupils are equal, round, and reactive to light. Right eye exhibits no discharge. Left eye exhibits no discharge. No scleral icterus.  Neck: Neck supple. No JVD present.  Cardiovascular: Normal rate, regular rhythm and normal heart sounds.  Exam reveals no gallop and no friction rub.   No murmur heard. Pulmonary/Chest: Effort normal and breath sounds normal. No respiratory distress. She has no wheezes. She has no rales.  Abdominal: Soft. Bowel sounds are normal. She exhibits no distension. There is no tenderness. There is no rebound.  Lymphadenopathy:    She has no cervical adenopathy.  Skin: She is not diaphoretic.          Assessment & Plan:  1. Sore throat - Rapid strep screen  2. Pharyngitis, acute Patient has viral pharyngitis. I gave the patient prescription for  Dukes Magic mouthwash. She is to gargle and swallow 1 teaspoon 4 times a day for the next week. If symptoms are not better in one-week she is to call me.

## 2013-09-26 ENCOUNTER — Other Ambulatory Visit: Payer: Self-pay | Admitting: Family Medicine

## 2013-09-26 NOTE — Telephone Encounter (Signed)
Medication refilled per protocol. 

## 2013-09-27 ENCOUNTER — Other Ambulatory Visit: Payer: Self-pay | Admitting: Family Medicine

## 2013-09-29 ENCOUNTER — Telehealth: Payer: Self-pay | Admitting: *Deleted

## 2013-09-29 NOTE — Telephone Encounter (Signed)
Pt was here last week, still no better, having soreness in throat, was told if not better to call, wants to know if you can give her something stronger.

## 2013-09-29 NOTE — Telephone Encounter (Signed)
Pt was here last week, still no better, having soreness in throat, was told if not better to call, wants to know if you can give her something stronger.  

## 2013-09-30 NOTE — Telephone Encounter (Signed)
Her strep test was negative, I want to see her Monday if it is no better.

## 2013-10-04 ENCOUNTER — Other Ambulatory Visit: Payer: Self-pay | Admitting: Family Medicine

## 2013-10-04 ENCOUNTER — Telehealth: Payer: Self-pay | Admitting: Family Medicine

## 2013-10-04 NOTE — Telephone Encounter (Signed)
OK refill?? 

## 2013-10-04 NOTE — Telephone Encounter (Signed)
Pt aware NTBS 

## 2013-10-04 NOTE — Telephone Encounter (Signed)
Pt having transportation issues.  Is running out of Medicaid visit days.  Has no idea when she can come here because she doesn't drive.  Tried to get refill of Nystatin but you denied.

## 2013-10-04 NOTE — Telephone Encounter (Signed)
If nystatin did not help after one prescription a refill would not be beneficial.  I need to examine her to see why her throat is still sore.

## 2013-10-04 NOTE — Telephone Encounter (Signed)
NTBS.

## 2013-10-04 NOTE — Telephone Encounter (Signed)
Pt still sick, Nystatin has not helped at all.  Still very congested, throat sore "feels like it is trying to close","head feels like it is coming off"  Can you call in something else??

## 2013-10-11 ENCOUNTER — Encounter: Payer: Self-pay | Admitting: Family Medicine

## 2013-10-11 ENCOUNTER — Ambulatory Visit (INDEPENDENT_AMBULATORY_CARE_PROVIDER_SITE_OTHER): Payer: Medicaid Other | Admitting: Family Medicine

## 2013-10-11 VITALS — BP 136/78 | HR 84 | Temp 97.0°F | Resp 16 | Ht 62.5 in | Wt 126.0 lb

## 2013-10-11 DIAGNOSIS — J329 Chronic sinusitis, unspecified: Secondary | ICD-10-CM

## 2013-10-11 MED ORDER — AMOXICILLIN-POT CLAVULANATE 875-125 MG PO TABS: 1.0000 | ORAL_TABLET | Freq: Two times a day (BID) | ORAL | Status: DC

## 2013-10-11 NOTE — Progress Notes (Signed)
Subjective:    Patient ID: Heather Gillespie, female    DOB: 10-Jul-1961, 53 y.o.   MRN: [id number]  HPI Patient continues to complain of severe sore throat. On her examination today her throat is non-erythematous. There is no tender cervical lymphadenopathy. There is no exudate in throat. She had a negative strep culture possibly one month ago. Of note she continues to have copious rhinorrhea and postnasal drip. She has chronic sinus pressure and chronic sinus pain. It is possible that the chronic postnasal drip could be irritating her throat. She reports subjective fevers. She has tried due to magic mouthwash, Flonase, numerous decongestants all without relief. Past Medical History  Diagnosis Date  . Glaucoma   . GERD (gastroesophageal reflux disease)   . Hemorrhoids   . IBS (irritable bowel syndrome)   . Hiatal hernia   . Abdominal pain   . Sinus problem   . N&V (nausea and vomiting)   . Rectal bleeding   . Rectal ulcer   . Allergy   . Vitamin D deficiency   . TMJ (dislocation of temporomandibular joint)   . Osteoporosis    Current Outpatient Prescriptions on File Prior to Visit  Medication Sig Dispense Refill  . cyclobenzaprine (FLEXERIL) 10 MG tablet Take 10 mg by mouth at bedtime.      Marland Kitchen dexlansoprazole (DEXILANT) 60 MG capsule Take 60 mg by mouth daily.      . fexofenadine (ALLEGRA) 180 MG tablet TAKE 1 TABLET BY MOUTH EVERY DAY  30 tablet  11  . fluticasone (FLONASE) 50 MCG/ACT nasal spray Place 2 sprays into the nose daily as needed for rhinitis.      Marland Kitchen gabapentin (NEURONTIN) 300 MG capsule Take 1 capsule (300 mg total) by mouth 3 (three) times daily.  30 capsule  0  . Olopatadine HCl (PATADAY) 0.2 % SOLN Place 1 drop into both eyes daily.        No current facility-administered medications on file prior to visit.   Allergies  Allergen Reactions  . Hydroxyzine Pamoate Rash   History   Social History  . Marital Status: Single    Spouse Name: N/A    Number of  Children: N/A  . Years of Education: N/A   Occupational History  . Not on file.   Social History Main Topics  . Smoking status: Never Smoker   . Smokeless tobacco: Never Used  . Alcohol Use: No  . Drug Use: No  . Sexual Activity: Not on file   Other Topics Concern  . Not on file   Social History Narrative  . No narrative on file      Review of Systems  All other systems reviewed and are negative.       Objective:   Physical Exam  Vitals reviewed. HENT:  Right Ear: Tympanic membrane, external ear and ear canal normal.  Left Ear: Tympanic membrane, external ear and ear canal normal.  Nose: Mucosal edema and rhinorrhea present. Right sinus exhibits maxillary sinus tenderness. Left sinus exhibits maxillary sinus tenderness.  Mouth/Throat: Oropharynx is clear and moist. No oropharyngeal exudate.  Eyes: Conjunctivae and EOM are normal. Pupils are equal, round, and reactive to light.  Neck: Neck supple.  Cardiovascular: Normal rate, regular rhythm and normal heart sounds.  Exam reveals no gallop and no friction rub.   No murmur heard. Pulmonary/Chest: Effort normal and breath sounds normal. No respiratory distress. She has no wheezes. She has no rales. She exhibits no tenderness.  Lymphadenopathy:  She has no cervical adenopathy.          Assessment & Plan:  1. Sinusitis, chronic Augmentin 875 mg by mouth twice a day for next 10 days. Also recommended Sudafed 60 mg every 6 hours as needed for congestion and use a Milligrams every 4 hours as needed for congestion. I reassured the patient that the examination of her throat today was entirely benign. I do not feel this is strep throat. I did not feel that this is minor. I think it is chronic irritation from postnasal drip. - amoxicillin-clavulanate (AUGMENTIN) 875-125 MG per tablet; Take 1 tablet by mouth 2 (two) times daily.  Dispense: 20 tablet; Refill: 0

## 2013-10-17 ENCOUNTER — Other Ambulatory Visit: Payer: Self-pay | Admitting: Family Medicine

## 2013-10-28 ENCOUNTER — Other Ambulatory Visit: Payer: Medicaid Other

## 2013-10-28 DIAGNOSIS — D649 Anemia, unspecified: Secondary | ICD-10-CM

## 2013-10-28 DIAGNOSIS — M810 Age-related osteoporosis without current pathological fracture: Secondary | ICD-10-CM

## 2013-10-28 DIAGNOSIS — Z0000 Encounter for general adult medical examination without abnormal findings: Secondary | ICD-10-CM

## 2013-10-28 DIAGNOSIS — E559 Vitamin D deficiency, unspecified: Secondary | ICD-10-CM

## 2013-10-28 DIAGNOSIS — Z79899 Other long term (current) drug therapy: Secondary | ICD-10-CM

## 2013-10-28 LAB — COMPLETE METABOLIC PANEL WITH GFR
ALK PHOS: 75 U/L (ref 39–117)
ALT: 9 U/L (ref 0–35)
AST: 17 U/L (ref 0–37)
Albumin: 4 g/dL (ref 3.5–5.2)
BILIRUBIN TOTAL: 0.7 mg/dL (ref 0.2–1.2)
BUN: 8 mg/dL (ref 6–23)
CO2: 27 mEq/L (ref 19–32)
Calcium: 9.9 mg/dL (ref 8.4–10.5)
Chloride: 103 mEq/L (ref 96–112)
Creat: 0.71 mg/dL (ref 0.50–1.10)
GFR, Est African American: >89 mL/min
GFR, Est Non African American: >89 mL/min
Glucose, Bld: 100 mg/dL — ABNORMAL HIGH (ref 70–99)
Potassium: 4.6 mEq/L (ref 3.5–5.3)
SODIUM: 138 meq/L (ref 135–145)
TOTAL PROTEIN: 7.4 g/dL (ref 6.0–8.3)

## 2013-10-28 LAB — CBC WITH DIFFERENTIAL/PLATELET
BASOS PCT: 0 % (ref 0–1)
Basophils Absolute: 0 10*3/uL (ref 0.0–0.1)
EOS PCT: 1 % (ref 0–5)
Eosinophils Absolute: 0 10*3/uL (ref 0.0–0.7)
HCT: 39.9 % (ref 36.0–46.0)
HEMOGLOBIN: 13.7 g/dL (ref 12.0–15.0)
Lymphocytes Relative: 34 % (ref 12–46)
Lymphs Abs: 1.9 10*3/uL (ref 0.7–4.0)
MCH: 29 pg (ref 26.0–34.0)
MCHC: 34.3 g/dL (ref 30.0–36.0)
MCV: 84.5 fL (ref 78.0–100.0)
MONO ABS: 0.3 10*3/uL (ref 0.1–1.0)
MONOS PCT: 6 % (ref 3–12)
Neutro Abs: 3.3 10*3/uL (ref 1.7–7.7)
Neutrophils Relative %: 59 % (ref 43–77)
Platelets: 200 10*3/uL (ref 150–400)
RBC: 4.72 MIL/uL (ref 3.87–5.11)
RDW: 14 % (ref 11.5–15.5)
WBC: 5.6 10*3/uL (ref 4.0–10.5)

## 2013-10-28 LAB — LIPID PANEL
CHOL/HDL RATIO: 2.7 ratio
Cholesterol: 195 mg/dL (ref 0–200)
HDL: 71 mg/dL (ref >39)
LDL Cholesterol: 112 mg/dL — ABNORMAL HIGH (ref 0–99)
Triglycerides: 59 mg/dL (ref <150)
VLDL: 12 mg/dL (ref 0–40)

## 2013-10-28 LAB — TSH: TSH: 1.151 u[IU]/mL (ref 0.350–4.500)

## 2013-10-29 LAB — VITAMIN D 25 HYDROXY (VIT D DEFICIENCY, FRACTURES): Vit D, 25-Hydroxy: 27 ng/mL — ABNORMAL LOW (ref 30–89)

## 2013-11-01 ENCOUNTER — Encounter: Payer: Medicaid Other | Admitting: Family Medicine

## 2013-11-08 ENCOUNTER — Encounter: Payer: Medicaid Other | Admitting: Family Medicine

## 2013-11-17 ENCOUNTER — Other Ambulatory Visit: Payer: Self-pay | Admitting: Family Medicine

## 2013-11-17 NOTE — Telephone Encounter (Signed)
CPE appt for later this month.  One refill sent

## 2013-11-18 ENCOUNTER — Encounter: Payer: Medicaid Other | Admitting: Family Medicine

## 2013-12-05 ENCOUNTER — Ambulatory Visit (INDEPENDENT_AMBULATORY_CARE_PROVIDER_SITE_OTHER): Payer: Medicaid Other | Admitting: Family Medicine

## 2013-12-05 ENCOUNTER — Encounter: Payer: Self-pay | Admitting: Family Medicine

## 2013-12-05 VITALS — BP 150/88 | HR 84 | Temp 98.3°F | Resp 16 | Ht 62.5 in | Wt 127.0 lb

## 2013-12-05 DIAGNOSIS — Z23 Encounter for immunization: Secondary | ICD-10-CM

## 2013-12-05 DIAGNOSIS — I10 Essential (primary) hypertension: Secondary | ICD-10-CM

## 2013-12-05 DIAGNOSIS — M7700 Medial epicondylitis, unspecified elbow: Secondary | ICD-10-CM

## 2013-12-05 DIAGNOSIS — Z0000 Encounter for general adult medical examination without abnormal findings: Secondary | ICD-10-CM

## 2013-12-05 MED ORDER — MELOXICAM 15 MG PO TABS: 15.0000 mg | ORAL_TABLET | Freq: Every day | ORAL | Status: DC

## 2013-12-05 MED ORDER — HYDROCHLOROTHIAZIDE 25 MG PO TABS: 25.0000 mg | ORAL_TABLET | Freq: Every day | ORAL | Status: DC

## 2013-12-05 NOTE — Addendum Note (Signed)
Addended by: Sharmon Revere on: 12/05/2013 11:20 AM   Modules accepted: Orders

## 2013-12-05 NOTE — Progress Notes (Signed)
Subjective:    Patient ID: Heather Gillespie, female    DOB: 03/17/1961, 53 y.o.   MRN: [id number]  HPI Patient is here today for complete physical exam. Her last colonoscopy was in 2012. She goes every 5 years and is due next in 2017. Her mammogram was performed in September of 2014 is normal. She had a Pap smear in 2014 and is normal. She is not due again until 2017. Her immunizations are up to date. She is due today for breast exam as well as fasting lab work. Her blood pressures elevated at 150/88. Her last 3 office visits have had borderline high or high blood pressure. She does have a family history of high blood pressure. She is also complaining of pain in her right elbow over the medial epicondyles. She reports aching and throbbing pain. She denies any specific injury. There is no deformity today on examination. It is worse with flexion of the elbow. She's also complaining of pain with abduction of her right shoulder. Past Medical History  Diagnosis Date  . Glaucoma   . GERD (gastroesophageal reflux disease)   . Hemorrhoids   . IBS (irritable bowel syndrome)   . Hiatal hernia   . Abdominal pain   . Sinus problem   . N&V (nausea and vomiting)   . Rectal bleeding   . Rectal ulcer   . Allergy   . Vitamin D deficiency   . TMJ (dislocation of temporomandibular joint)   . Osteoporosis    Past Surgical History  Procedure Laterality Date  . Colonoscopy w/ polypectomy      colon ulcers  . Cholecystectomy  1984  . Hemorroidectomy    . Anal fissure repair    . Eye surgery    . Hemi-microdiscectomy lumbar laminectomy level 1 Left 05/26/2013    Procedure: HEMI-MICRODISCECTOMY LUMBAR LAMINECTOMY L4 - L5 ON THE LEFT LEVEL 1;  Surgeon: Tobi Bastos, MD;  Location: [facility];  Service: Orthopedics;  Laterality: Left;   Current Outpatient Prescriptions on File Prior to Visit  Medication Sig Dispense Refill  . cyclobenzaprine (FLEXERIL) 10 MG tablet Take 10 mg by mouth at bedtime.      Marland Kitchen  dexlansoprazole (DEXILANT) 60 MG capsule Take 60 mg by mouth daily.      . fexofenadine (ALLEGRA) 180 MG tablet TAKE 1 TABLET BY MOUTH EVERY DAY  30 tablet  11  . fluticasone (FLONASE) 50 MCG/ACT nasal spray Place 2 sprays into the nose daily as needed for rhinitis.      Marland Kitchen gabapentin (NEURONTIN) 300 MG capsule Take 1 capsule (300 mg total) by mouth 3 (three) times daily.  30 capsule  0  . Olopatadine HCl (PATADAY) 0.2 % SOLN Place 1 drop into both eyes daily.        No current facility-administered medications on file prior to visit.   Allergies  Allergen Reactions  . Hydroxyzine Pamoate Rash   History   Social History  . Marital Status: Single    Spouse Name: N/A    Number of Children: N/A  . Years of Education: N/A   Occupational History  . Not on file.   Social History Main Topics  . Smoking status: Never Smoker   . Smokeless tobacco: Never Used  . Alcohol Use: No  . Drug Use: No  . Sexual Activity: Not on file   Other Topics Concern  . Not on file   Social History Narrative  . No narrative on file   Family  History  Problem Relation Age of Onset  . Diabetes Mother   . Stroke Father       Review of Systems  All other systems reviewed and are negative.       Objective:   Physical Exam  Vitals reviewed. Constitutional: She is oriented to person, place, and time. She appears well-developed and well-nourished. No distress.  HENT:  Head: Normocephalic and atraumatic.  Right Ear: External ear normal.  Left Ear: External ear normal.  Nose: Nose normal.  Mouth/Throat: Oropharynx is clear and moist. No oropharyngeal exudate.  Eyes: Conjunctivae and EOM are normal. Pupils are equal, round, and reactive to light. Right eye exhibits no discharge. Left eye exhibits no discharge. No scleral icterus.  Neck: Normal range of motion. Neck supple. No JVD present. No tracheal deviation present. No thyromegaly present.  Cardiovascular: Normal rate, regular rhythm, normal  heart sounds and intact distal pulses.  Exam reveals no gallop and no friction rub.   No murmur heard. Pulmonary/Chest: Effort normal and breath sounds normal. No stridor. No respiratory distress. She has no wheezes. She has no rales. She exhibits no tenderness.  Abdominal: Soft. Bowel sounds are normal. She exhibits no distension and no mass. There is no tenderness. There is no rebound and no guarding.  Musculoskeletal: Normal range of motion. She exhibits no edema and no tenderness.  Lymphadenopathy:    She has no cervical adenopathy.  Neurological: She is alert and oriented to person, place, and time. She has normal reflexes. She displays normal reflexes. No cranial nerve deficit. She exhibits normal muscle tone. Coordination normal.  Skin: Skin is warm. No rash noted. She is not diaphoretic. No erythema. No pallor.  Psychiatric: She has a normal mood and affect. Her behavior is normal. Judgment and thought content normal.   Patient is tender to palpation over the right medial epicondyles. She also has pain with abduction greater than 90 in her right shoulder. There no visible deformities. There is no erythema or swelling. She is range of motion of the elbow.       Assessment & Plan:  1. Need for prophylactic vaccination and inoculation against viral hepatitis - Flu Vaccine QUAD 36+ mos IM  2. HTN (hypertension) Begin  hydrochlorothiazide 25 mg by mouth daily - hydrochlorothiazide (HYDRODIURIL) 25 MG tablet; Take 1 tablet (25 mg total) by mouth daily.  Dispense: 90 tablet; Refill: 3  3. Medial epicondylitis Begin Mobic 15 mg by mouth daily and recheck blood pressure and elbow in 2 weeks. - meloxicam (MOBIC) 15 MG tablet; Take 1 tablet (15 mg total) by mouth daily.  Dispense: 30 tablet; Refill: 0  4. Routine general medical examination at a health care facility Patient's cancer screening is up to date. I will check a CBC, CMP, fasting lipid panel, and TSH.

## 2013-12-15 ENCOUNTER — Other Ambulatory Visit: Payer: Self-pay | Admitting: Family Medicine

## 2013-12-15 NOTE — Telephone Encounter (Signed)
Refill appropriate and filled per protocol. 

## 2013-12-19 ENCOUNTER — Ambulatory Visit (INDEPENDENT_AMBULATORY_CARE_PROVIDER_SITE_OTHER): Payer: Medicaid Other | Admitting: Family Medicine

## 2013-12-19 ENCOUNTER — Encounter: Payer: Self-pay | Admitting: Family Medicine

## 2013-12-19 VITALS — BP 130/80 | HR 68 | Temp 98.2°F | Resp 16 | Ht 62.5 in | Wt 127.0 lb

## 2013-12-19 DIAGNOSIS — M7700 Medial epicondylitis, unspecified elbow: Secondary | ICD-10-CM

## 2013-12-19 DIAGNOSIS — M7701 Medial epicondylitis, right elbow: Secondary | ICD-10-CM

## 2013-12-19 NOTE — Progress Notes (Signed)
Subjective:    Patient ID: Heather Gillespie, female    DOB: May 16, 1961, 53 y.o.   MRN: [id number]  HPI Patient's blood pressure appears much better at 130/80. She is tolerating the medication well without any side effects. I reviewed her most recent fasting lipid panel and lab work with the patient. All labs are within normal limits. Most recent lab work is listed below: No visits with results within 1 Month(s) from this visit. Latest known visit with results is:  Lab on 10/28/2013  Component Date Value Ref Range Status  . WBC 10/28/2013 5.6  4.0 - 10.5 K/uL Final  . RBC 10/28/2013 4.72  3.87 - 5.11 MIL/uL Final  . Hemoglobin 10/28/2013 13.7  12.0 - 15.0 g/dL Final  . HCT 10/28/2013 39.9  36.0 - 46.0 % Final  . MCV 10/28/2013 84.5  78.0 - 100.0 fL Final  . MCH 10/28/2013 29.0  26.0 - 34.0 pg Final  . MCHC 10/28/2013 34.3  30.0 - 36.0 g/dL Final  . RDW 10/28/2013 14.0  11.5 - 15.5 % Final  . Platelets 10/28/2013 200  150 - 400 K/uL Final  . Neutrophils Relative % 10/28/2013 59  43 - 77 % Final  . Neutro Abs 10/28/2013 3.3  1.7 - 7.7 K/uL Final  . Lymphocytes Relative 10/28/2013 34  12 - 46 % Final  . Lymphs Abs 10/28/2013 1.9  0.7 - 4.0 K/uL Final  . Monocytes Relative 10/28/2013 6  3 - 12 % Final  . Monocytes Absolute 10/28/2013 0.3  0.1 - 1.0 K/uL Final  . Eosinophils Relative 10/28/2013 1  0 - 5 % Final  . Eosinophils Absolute 10/28/2013 0.0  0.0 - 0.7 K/uL Final  . Basophils Relative 10/28/2013 0  0 - 1 % Final  . Basophils Absolute 10/28/2013 0.0  0.0 - 0.1 K/uL Final  . Smear Review 10/28/2013 Criteria for review not met   Final  . Vit D, 25-Hydroxy 10/28/2013 27* 30 - 89 ng/mL Final   Comment: This assay accurately quantifies Vitamin D, which is the sum of the                          25-Hydroxy forms of Vitamin D2 and D3.  Studies have shown that the                          optimum concentration of 25-Hydroxy Vitamin D is 30 ng/mL or higher.    Concentrations of Vitamin D between 20 and 29 ng/mL are considered to                          be insufficient and concentrations less than 20 ng/mL are considered                          to be deficient for Vitamin D.  . TSH 10/28/2013 1.151  0.350 - 4.500 uIU/mL Final  . Cholesterol 10/28/2013 195  0 - 200 mg/dL Final   Comment: ATP III Classification:                                < 200        mg/dL        Desirable  200 - 239     mg/dL        Borderline High                               >= 240        mg/dL        High                             . Triglycerides 10/28/2013 59  <150 mg/dL Final  . HDL 10/28/2013 71  >39 mg/dL Final  . Total CHOL/HDL Ratio 10/28/2013 2.7   Final  . VLDL 10/28/2013 12  0 - 40 mg/dL Final  . LDL Cholesterol 10/28/2013 112* 0 - 99 mg/dL Final   Comment:                            Total Cholesterol/HDL Ratio:CHD Risk                                                 Coronary Heart Disease Risk Table                                                                 Men       Women                                   1/2 Average Risk              3.4        3.3                                       Average Risk              5.0        4.4                                    2X Average Risk              9.6        7.1                                    3X Average Risk             23.4       11.0                          Use the calculated Patient Ratio above and the CHD Risk table  to determine the patient's CHD Risk.                          ATP III Classification (LDL):                                < 100        mg/dL         Optimal                               100 - 129     mg/dL         Near or Above Optimal                               130 - 159     mg/dL         Borderline High                               160 - 189     mg/dL         High                                > 190        mg/dL          Very High                             . Sodium 10/28/2013 138  135 - 145 mEq/L Final  . Potassium 10/28/2013 4.6  3.5 - 5.3 mEq/L Final  . Chloride 10/28/2013 103  96 - 112 mEq/L Final  . CO2 10/28/2013 27  19 - 32 mEq/L Final  . Glucose, Bld 10/28/2013 100* 70 - 99 mg/dL Final  . BUN 10/28/2013 8  6 - 23 mg/dL Final  . Creat 10/28/2013 0.71  0.50 - 1.10 mg/dL Final  . Total Bilirubin 10/28/2013 0.7  0.2 - 1.2 mg/dL Final   ** Please note change in reference range(s). **  . Alkaline Phosphatase 10/28/2013 75  39 - 117 U/L Final  . AST 10/28/2013 17  0 - 37 U/L Final  . ALT 10/28/2013 9  0 - 35 U/L Final  . Total Protein 10/28/2013 7.4  6.0 - 8.3 g/dL Final  . Albumin 10/28/2013 4.0  3.5 - 5.2 g/dL Final  . Calcium 10/28/2013 9.9  8.4 - 10.5 mg/dL Final  . GFR, Est African American 10/28/2013 >89   Final  . GFR, Est Non African American 10/28/2013 >89   Final   Comment:                            The estimated GFR is a calculation valid for adults (>=69 years old)                          that uses the CKD-EPI algorithm to adjust for age and sex. It is  not to be used for children, pregnant women, hospitalized patients,                             patients on dialysis, or with rapidly changing kidney function.                          According to the NKDEP, eGFR >89 is normal, 60-89 shows mild                          impairment, 30-59 shows moderate impairment, 15-29 shows severe                          impairment and <15 is ESRD.                              She continues to have severe pain in the right medial epicondyles. It is worse with wrist flexion and extension supination and pronation. She also occasionally has numbness and tingling in her frontal and digits on the same hand. She is tender to palpation over the right medial epicondyles   Review of Systems  All other systems reviewed and are negative.       Objective:   Physical Exam  Vitals  reviewed. Cardiovascular: Normal rate, regular rhythm and normal heart sounds.   Pulmonary/Chest: Effort normal and breath sounds normal.  Musculoskeletal:       Right elbow: She exhibits normal range of motion, no swelling and no effusion. Tenderness found. Medial epicondyle tenderness noted.          Assessment & Plan:  1. Medial epicondylitis of right elbow I recommended an x-ray of the right elbow but the patient would defer that and elects to proceed with is urged injection first. If the pain is no better after the present injection, she consents to an x-ray of the right elbow. I believe the patient has medial epicondylitis and possibly some mild ulnar neuropathy due to inflammation. Using sterile technique, the tendon proximal to the medial epicondyles injected with a mixture of 1 cc of lidocaine 0.1% and 1 cc of 40 mg per mL Kenalog. Patient tolerated procedure well without complication. Recheck in one week. If no better I would proceed with an x-ray and possibly orthopedic consult.

## 2013-12-26 ENCOUNTER — Telehealth: Payer: Self-pay | Admitting: *Deleted

## 2013-12-26 NOTE — Telephone Encounter (Signed)
Pt called stating that she is feeling 100percent better since the cortizone injection, states has not been hurting since. Says to tell you Thank you.

## 2014-01-24 ENCOUNTER — Other Ambulatory Visit: Payer: Self-pay | Admitting: Family Medicine

## 2014-01-24 NOTE — Telephone Encounter (Signed)
Refill appropriate and filled per protocol. 

## 2014-01-25 ENCOUNTER — Other Ambulatory Visit: Payer: Self-pay | Admitting: Family Medicine

## 2014-01-25 NOTE — Telephone Encounter (Signed)
Medication refilled per protocol. 

## 2014-02-17 ENCOUNTER — Emergency Department (INDEPENDENT_AMBULATORY_CARE_PROVIDER_SITE_OTHER)
Admission: EM | Admit: 2014-02-17 | Discharge: 2014-02-17 | Disposition: A | Discharge status: Home / Self Care | Payer: Medicaid Other | Source: Home / Self Care | Attending: Family Medicine | Admitting: Family Medicine

## 2014-02-17 ENCOUNTER — Encounter (HOSPITAL_COMMUNITY): Payer: Self-pay | Admitting: Emergency Medicine

## 2014-02-17 DIAGNOSIS — H5710 Ocular pain, unspecified eye: Secondary | ICD-10-CM

## 2014-02-17 MED ORDER — TETRACAINE HCL 0.5 % OP SOLN
OPHTHALMIC | Status: AC
  Filled 2014-02-17: qty 2

## 2014-02-17 MED ORDER — TETRACAINE HCL 0.5 % OP SOLN
2.0000 [drp] | Freq: Once | OPHTHALMIC | Status: AC
  Administered 2014-02-17: 2 [drp] via OPHTHALMIC

## 2014-02-17 NOTE — ED Provider Notes (Signed)
Heather Gillespie is a 53 y.o. female who presents to Urgent Care today for right eye pain. Patient notes pain in the right eye starting yesterday evening. This is associated with redness and swelling. She notes photophobia. She denies any fevers or chills nausea vomiting or diarrhea. Her medical history is pertinent for glaucoma. She denies any injury. She has not tried any medications yet.   Past Medical History  Diagnosis Date  . Glaucoma   . GERD (gastroesophageal reflux disease)   . Hemorrhoids   . IBS (irritable bowel syndrome)   . Hiatal hernia   . Abdominal pain   . Sinus problem   . N&V (nausea and vomiting)   . Rectal bleeding   . Rectal ulcer   . Allergy   . Vitamin D deficiency   . TMJ (dislocation of temporomandibular joint)   . Osteoporosis    History  Substance Use Topics  . Smoking status: Never Smoker   . Smokeless tobacco: Never Used  . Alcohol Use: No   ROS as above Medications: No current facility-administered medications for this encounter.   Current Outpatient Prescriptions  Medication Sig Dispense Refill  . fexofenadine (ALLEGRA) 180 MG tablet TAKE 1 TABLET BY MOUTH EVERY DAY  30 tablet  11  . cyclobenzaprine (FLEXERIL) 10 MG tablet Take 10 mg by mouth at bedtime.      Marland Kitchen dexlansoprazole (DEXILANT) 60 MG capsule Take 60 mg by mouth daily.      . fluticasone (FLONASE) 50 MCG/ACT nasal spray PLACE 2 SPRAYS INTO THE NOSE DAILY.  16 g  11  . gabapentin (NEURONTIN) 300 MG capsule Take 1 capsule (300 mg total) by mouth 3 (three) times daily.  30 capsule  0  . hydrochlorothiazide (HYDRODIURIL) 25 MG tablet Take 1 tablet (25 mg total) by mouth daily.  90 tablet  3  . meloxicam (MOBIC) 15 MG tablet Take 1 tablet (15 mg total) by mouth daily.  30 tablet  0  . PATADAY 0.2 % SOLN PLACE 1 DROP INTO BOTH EYES EVERY DAY  2.5 mL  6    Exam:  BP 147/79  Pulse 96  Temp(Src) 98 F (36.7 C) (Oral)  Resp 18  SpO2 96% Gen: Well NAD HEENT: EOMI,  MMM right eye  conjunctiva injection over the entire right sclera. Normal eye motion. PERRLA intact.  Fluorescein stain is normal. IOP 25 Lungs: Normal work of breathing. CTABL Heart: RRR no MRG Abd: NABS, Soft. NT, ND Exts: Brisk capillary refill, warm and well perfused.   No results found for this or any previous visit (from the past 24 hour(s)). No results found.  Assessment and Plan: 53 y.o. female with right eye pain. 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 Discussed case with Dr. Talbert Forest. Transfer directly to his office.  Discussed warning signs or symptoms. Please see discharge instructions. Patient expresses understanding.    Gregor Hams, MD 02/17/14 [phone]

## 2014-02-17 NOTE — ED Notes (Signed)
C/o bilateral eye pain.  Redness and drainage of the right eye.   Sinus pressure and pain.  Headaches.  Low grade temp.   Symptoms present since yesterday.   No relief with otc meds.

## 2014-02-17 NOTE — Discharge Instructions (Signed)
Thank you for coming in today. GO DIRECTLY TO SEE DR. Talbert Forest.

## 2014-03-21 ENCOUNTER — Other Ambulatory Visit: Payer: Self-pay | Admitting: Family Medicine

## 2014-04-17 ENCOUNTER — Ambulatory Visit (INDEPENDENT_AMBULATORY_CARE_PROVIDER_SITE_OTHER): Payer: Medicaid Other | Admitting: Family Medicine

## 2014-04-17 ENCOUNTER — Encounter: Payer: Self-pay | Admitting: Family Medicine

## 2014-04-17 VITALS — BP 140/88 | HR 78 | Temp 97.8°F | Resp 14 | Ht 62.5 in | Wt 136.0 lb

## 2014-04-17 DIAGNOSIS — L0100 Impetigo, unspecified: Secondary | ICD-10-CM

## 2014-04-17 MED ORDER — MUPIROCIN 2 % EX OINT: 1.0000 "application " | TOPICAL_OINTMENT | Freq: Two times a day (BID) | CUTANEOUS | Status: DC

## 2014-04-17 NOTE — Progress Notes (Signed)
Subjective:    Patient ID: Heather Gillespie, female    DOB: 12/04/1960, 53 y.o.   MRN: [id number]  HPI Patient suffered some type of insect bite on the posterior aspect of her left thigh approximately on July 18. There is still inflamed. On examination she has a 3 mm shallow ulcer with honey-colored crust a small rim of erythema approximately 2 mm thick. There is no evidence of spreading cellulitis. There is no drainage from the wound. Patient states that the wound is tender. Past Medical History  Diagnosis Date  . Glaucoma   . GERD (gastroesophageal reflux disease)   . Hemorrhoids   . IBS (irritable bowel syndrome)   . Hiatal hernia   . Abdominal pain   . Sinus problem   . N&V (nausea and vomiting)   . Rectal bleeding   . Rectal ulcer   . Allergy   . Vitamin D deficiency   . TMJ (dislocation of temporomandibular joint)   . Osteoporosis    Current Outpatient Prescriptions on File Prior to Visit  Medication Sig Dispense Refill  . cyclobenzaprine (FLEXERIL) 10 MG tablet Take 10 mg by mouth at bedtime.      Marland Kitchen dexlansoprazole (DEXILANT) 60 MG capsule Take 60 mg by mouth daily.      . fexofenadine (ALLEGRA) 180 MG tablet TAKE 1 TABLET BY MOUTH EVERY DAY  30 tablet  11  . fluticasone (FLONASE) 50 MCG/ACT nasal spray PLACE 2 SPRAYS INTO THE NOSE DAILY.  16 g  11  . gabapentin (NEURONTIN) 300 MG capsule Take 1 capsule (300 mg total) by mouth 3 (three) times daily.  30 capsule  0  . hydrochlorothiazide (HYDRODIURIL) 25 MG tablet TAKE 1 TABLET BY MOUTH EVERY DAY  90 tablet  3  . meloxicam (MOBIC) 15 MG tablet Take 1 tablet (15 mg total) by mouth daily.  30 tablet  0  . PATADAY 0.2 % SOLN PLACE 1 DROP INTO BOTH EYES EVERY DAY  2.5 mL  6   No current facility-administered medications on file prior to visit.   Allergies  Allergen Reactions  . Hydroxyzine Pamoate Rash   History   Social History  . Marital Status: Single    Spouse Name: N/A    Number of Children: N/A  . Years of  Education: N/A   Occupational History  . Not on file.   Social History Main Topics  . Smoking status: Never Smoker   . Smokeless tobacco: Never Used  . Alcohol Use: No  . Drug Use: No  . Sexual Activity: Not on file   Other Topics Concern  . Not on file   Social History Narrative  . No narrative on file      Review of Systems  All other systems reviewed and are negative.      Objective:   Physical Exam  Vitals reviewed. Cardiovascular: Normal rate and regular rhythm.   Pulmonary/Chest: Effort normal and breath sounds normal.   3 mm shallow ulcer with a honey-colored crust. 2 mm thick rim of erythema surrounding the ulcer        Assessment & Plan:  1. Impetigo Patient appears to have an insect bite which is developed a secondary impetigo. She's been using Polysporin twice daily for the last 2 weeks without any improvement. Therefore I'll switch the patient to Bactroban. Recommended she apply the ointment twice a day for the next 7-10 days.  I recommended she keep the wound covered to try to her picking and  scratching at the wound. Recheck in 1 week if no better sooner if worse - mupirocin ointment (BACTROBAN) 2 %; Apply 1 application topically 2 (two) times daily.  Dispense: 22 g; Refill: 0

## 2014-04-19 ENCOUNTER — Other Ambulatory Visit: Payer: Self-pay | Admitting: Family Medicine

## 2014-05-16 ENCOUNTER — Other Ambulatory Visit: Payer: Self-pay | Admitting: Family Medicine

## 2014-05-16 DIAGNOSIS — Z1231 Encounter for screening mammogram for malignant neoplasm of breast: Secondary | ICD-10-CM

## 2014-06-19 ENCOUNTER — Ambulatory Visit (HOSPITAL_COMMUNITY)
Admission: RE | Admit: 2014-06-19 | Discharge: 2014-06-19 | Disposition: A | Discharge status: Home / Self Care | Payer: PRIVATE HEALTH INSURANCE | Source: Ambulatory Visit | Attending: Family Medicine | Admitting: Family Medicine

## 2014-06-19 ENCOUNTER — Other Ambulatory Visit: Payer: Self-pay | Admitting: Family Medicine

## 2014-06-19 DIAGNOSIS — Z1231 Encounter for screening mammogram for malignant neoplasm of breast: Secondary | ICD-10-CM | POA: Insufficient documentation

## 2014-06-20 ENCOUNTER — Other Ambulatory Visit: Payer: Self-pay | Admitting: Family Medicine

## 2014-10-23 ENCOUNTER — Other Ambulatory Visit: Payer: Self-pay | Admitting: Family Medicine

## 2014-10-25 ENCOUNTER — Other Ambulatory Visit: Payer: Self-pay | Admitting: Family Medicine

## 2014-10-25 NOTE — Telephone Encounter (Signed)
Medication refilled per protocol. 

## 2014-10-26 ENCOUNTER — Other Ambulatory Visit: Payer: Self-pay | Admitting: Family Medicine

## 2014-11-20 ENCOUNTER — Other Ambulatory Visit: Payer: Self-pay | Admitting: Family Medicine

## 2014-12-02 ENCOUNTER — Emergency Department (INDEPENDENT_AMBULATORY_CARE_PROVIDER_SITE_OTHER)
Admission: EM | Admit: 2014-12-02 | Discharge: 2014-12-02 | Disposition: A | Discharge status: Home / Self Care | Payer: PRIVATE HEALTH INSURANCE | Source: Home / Self Care | Attending: Family Medicine | Admitting: Family Medicine

## 2014-12-02 ENCOUNTER — Encounter (HOSPITAL_COMMUNITY): Payer: Self-pay | Admitting: Emergency Medicine

## 2014-12-02 DIAGNOSIS — N390 Urinary tract infection, site not specified: Secondary | ICD-10-CM | POA: Diagnosis not present

## 2014-12-02 LAB — POCT URINALYSIS DIP (DEVICE)
Bilirubin Urine: NEGATIVE
GLUCOSE, UA: NEGATIVE mg/dL
Hgb urine dipstick: NEGATIVE
Ketones, ur: NEGATIVE mg/dL
Nitrite: NEGATIVE
PH: 6.5 (ref 5.0–8.0)
PROTEIN: NEGATIVE mg/dL
Specific Gravity, Urine: 1.025 (ref 1.005–1.030)
UROBILINOGEN UA: 0.2 mg/dL (ref 0.0–1.0)

## 2014-12-02 MED ORDER — CEPHALEXIN 500 MG PO CAPS: 500.0000 mg | ORAL_CAPSULE | Freq: Two times a day (BID) | ORAL | Status: DC

## 2014-12-02 NOTE — Discharge Instructions (Signed)
Thank you for coming in today. If your belly pain worsens, or you have high fever, bad vomiting, blood in your stool or black tarry stool go to the Emergency Room.   Urinary Tract Infection A urinary tract infection (UTI) can occur any place along the urinary tract. The tract includes the kidneys, ureters, bladder, and urethra. A type of germ called bacteria often causes a UTI. UTIs are often helped with antibiotic medicine.  HOME CARE   If given, take antibiotics as told by your doctor. Finish them even if you start to feel better.  Drink enough fluids to keep your pee (urine) clear or pale yellow.  Avoid tea, drinks with caffeine, and bubbly (carbonated) drinks.  Pee often. Avoid holding your pee in for a long time.  Pee before and after having sex (intercourse).  Wipe from front to back after you poop (bowel movement) if you are a woman. Use each tissue only once. GET HELP RIGHT AWAY IF:   You have back pain.  You have lower belly (abdominal) pain.  You have chills.  You feel sick to your stomach (nauseous).  You throw up (vomit).  Your burning or discomfort with peeing does not go away.  You have a fever.  Your symptoms are not better in 3 days. MAKE SURE YOU:   Understand these instructions.  Will watch your condition.  Will get help right away if you are not doing well or get worse. Document Released: 02/18/2008 Document Revised: 05/26/2012 Document Reviewed: 04/01/2012 [facility] Patient Information 2015 [place], [place]. This information is not intended to replace advice given to you by your health care provider. Make sure you discuss any questions you have with your health care provider.

## 2014-12-02 NOTE — ED Notes (Signed)
Pt states that she has had pressure and burning during urination with back pain.

## 2014-12-02 NOTE — ED Provider Notes (Signed)
Heather Gillespie is a 54 y.o. female who presents to Urgent Care today for urinary frequency urgency and dysuria present for a week. No fevers or chills vomiting or diarrhea. No treatments tried yet.   Past Medical History  Diagnosis Date  . Glaucoma   . GERD (gastroesophageal reflux disease)   . Hemorrhoids   . IBS (irritable bowel syndrome)   . Hiatal hernia   . Abdominal pain   . Sinus problem   . N&V (nausea and vomiting)   . Rectal bleeding   . Rectal ulcer   . Allergy   . Vitamin D deficiency   . TMJ (dislocation of temporomandibular joint)   . Osteoporosis    Past Surgical History  Procedure Laterality Date  . Colonoscopy w/ polypectomy      colon ulcers  . Cholecystectomy  1984  . Hemorroidectomy    . Anal fissure repair    . Eye surgery    . Hemi-microdiscectomy lumbar laminectomy level 1 Left 05/26/2013    Procedure: HEMI-MICRODISCECTOMY LUMBAR LAMINECTOMY L4 - L5 ON THE LEFT LEVEL 1;  Surgeon: Tobi Bastos, MD;  Location: [facility];  Service: Orthopedics;  Laterality: Left;   History  Substance Use Topics  . Smoking status: Never Smoker   . Smokeless tobacco: Never Used  . Alcohol Use: No   ROS as above Medications: No current facility-administered medications for this encounter.   Current Outpatient Prescriptions  Medication Sig Dispense Refill  . cephALEXin (KEFLEX) 500 MG capsule Take 1 capsule (500 mg total) by mouth 2 (two) times daily. 14 capsule 0  . cyclobenzaprine (FLEXERIL) 10 MG tablet Take 10 mg by mouth at bedtime.    Marland Kitchen dexlansoprazole (DEXILANT) 60 MG capsule Take 60 mg by mouth daily.    . fexofenadine (ALLEGRA) 180 MG tablet TAKE 1 TABLET BY MOUTH EVERY DAY 30 tablet 11  . fexofenadine (ALLEGRA) 180 MG tablet TAKE 1 TABLET BY MOUTH EVERY DAY 30 tablet 11  . fluticasone (FLONASE) 50 MCG/ACT nasal spray PLACE 2 SPRAYS INTO THE NOSE DAILY. 16 g 11  . gabapentin (NEURONTIN) 300 MG capsule Take 1 capsule (300 mg total) by mouth 3 (three) times  daily. 30 capsule 0  . gabapentin (NEURONTIN) 300 MG capsule TAKE 1 CAPSULE THREE TIMES A DAY 90 capsule 5  . hydrochlorothiazide (HYDRODIURIL) 25 MG tablet TAKE 1 TABLET BY MOUTH EVERY DAY 90 tablet 3  . meloxicam (MOBIC) 15 MG tablet Take 1 tablet (15 mg total) by mouth daily. 30 tablet 0  . mupirocin ointment (BACTROBAN) 2 % Apply 1 application topically 2 (two) times daily. 22 g 0  . PATADAY 0.2 % SOLN PLACE 1 DROP INTO BOTH EYES EVERY DAY 2.5 mL 6  . PATADAY 0.2 % SOLN PLACE 1 DROP INTO BOTH EYES EVERY DAY 2.5 mL 6   Allergies  Allergen Reactions  . Hydroxyzine Pamoate Rash     Exam:  BP 132/87 mmHg  Pulse 92  Temp(Src) 98.5 F (36.9 C) (Oral)  Resp 18  SpO2 97% Gen: Well NAD HEENT: EOMI,  MMM Lungs: Normal work of breathing. CTABL Heart: RRR no MRG Abd: NABS, Soft. Nondistended, Nontender no CVA tenderness to percussion Exts: Brisk capillary refill, warm and well perfused.   Results for orders placed or performed during the hospital encounter of 12/02/14 (from the past 24 hour(s))  POCT urinalysis dip (device)     Status: Abnormal   Collection Time: 12/02/14  9:44 AM  Result Value Ref Range  Glucose, UA NEGATIVE NEGATIVE mg/dL   Bilirubin Urine NEGATIVE NEGATIVE   Ketones, ur NEGATIVE NEGATIVE mg/dL   Specific Gravity, Urine 1.025 1.005 - 1.030   Hgb urine dipstick NEGATIVE NEGATIVE   pH 6.5 5.0 - 8.0   Protein, ur NEGATIVE NEGATIVE mg/dL   Urobilinogen, UA 0.2 0.0 - 1.0 mg/dL   Nitrite NEGATIVE NEGATIVE   Leukocytes, UA TRACE (A) NEGATIVE   No results found.  Assessment and Plan: 54 y.o. female with UTI. Culture pending treat with Keflex.  Discussed warning signs or symptoms. Please see discharge instructions. Patient expresses understanding.     Gregor Hams, MD 12/02/14 [phone]

## 2014-12-03 LAB — URINE CULTURE
Colony Count: 15000
Special Requests: NORMAL

## 2014-12-04 ENCOUNTER — Other Ambulatory Visit: Payer: Self-pay

## 2014-12-04 DIAGNOSIS — Z1231 Encounter for screening mammogram for malignant neoplasm of breast: Secondary | ICD-10-CM

## 2014-12-11 ENCOUNTER — Ambulatory Visit (INDEPENDENT_AMBULATORY_CARE_PROVIDER_SITE_OTHER): Payer: Medicaid Other | Admitting: Family Medicine

## 2014-12-11 ENCOUNTER — Encounter: Payer: Self-pay | Admitting: Family Medicine

## 2014-12-11 VITALS — BP 141/96 | HR 96 | Temp 98.1°F | Resp 16 | Ht 62.5 in | Wt 137.0 lb

## 2014-12-11 DIAGNOSIS — Z0000 Encounter for general adult medical examination without abnormal findings: Secondary | ICD-10-CM

## 2014-12-11 LAB — COMPLETE METABOLIC PANEL WITH GFR
ALBUMIN: 4.7 g/dL (ref 3.5–5.2)
ALT: 8 U/L (ref 0–35)
AST: 14 U/L (ref 0–37)
Alkaline Phosphatase: 69 U/L (ref 39–117)
BUN: 13 mg/dL (ref 6–23)
CALCIUM: 10.2 mg/dL (ref 8.4–10.5)
CO2: 27 mEq/L (ref 19–32)
CREATININE: 0.69 mg/dL (ref 0.50–1.10)
Chloride: 103 mEq/L (ref 96–112)
GFR, Est Non African American: >89 mL/min
Glucose, Bld: 87 mg/dL (ref 70–99)
POTASSIUM: 4.6 meq/L (ref 3.5–5.3)
Sodium: 142 mEq/L (ref 135–145)
Total Bilirubin: 0.9 mg/dL (ref 0.2–1.2)
Total Protein: 7.5 g/dL (ref 6.0–8.3)

## 2014-12-11 LAB — LIPID PANEL
Cholesterol: 231 mg/dL — ABNORMAL HIGH (ref 0–200)
HDL: 63 mg/dL (ref >=46)
LDL CALC: 152 mg/dL — AB (ref 0–99)
Total CHOL/HDL Ratio: 3.7 Ratio
Triglycerides: 81 mg/dL (ref <150)
VLDL: 16 mg/dL (ref 0–40)

## 2014-12-11 LAB — CBC WITH DIFFERENTIAL/PLATELET
Basophils Absolute: 0 10*3/uL (ref 0.0–0.1)
Basophils Relative: 0 % (ref 0–1)
EOS PCT: 0 % (ref 0–5)
Eosinophils Absolute: 0 10*3/uL (ref 0.0–0.7)
HCT: 42.2 % (ref 36.0–46.0)
HEMOGLOBIN: 13.9 g/dL (ref 12.0–15.0)
LYMPHS ABS: 2.2 10*3/uL (ref 0.7–4.0)
LYMPHS PCT: 39 % (ref 12–46)
MCH: 28.4 pg (ref 26.0–34.0)
MCHC: 32.9 g/dL (ref 30.0–36.0)
MCV: 86.1 fL (ref 78.0–100.0)
MONO ABS: 0.3 10*3/uL (ref 0.1–1.0)
MONOS PCT: 5 % (ref 3–12)
MPV: 10.7 fL (ref 8.6–12.4)
Neutro Abs: 3.1 10*3/uL (ref 1.7–7.7)
Neutrophils Relative %: 56 % (ref 43–77)
Platelets: 196 10*3/uL (ref 150–400)
RBC: 4.9 MIL/uL (ref 3.87–5.11)
RDW: 14.4 % (ref 11.5–15.5)
WBC: 5.6 10*3/uL (ref 4.0–10.5)

## 2014-12-11 LAB — TSH: TSH: 0.901 u[IU]/mL (ref 0.350–4.500)

## 2014-12-11 NOTE — Progress Notes (Signed)
Subjective:    Patient ID: Heather Gillespie, female    DOB: Feb 21, 1961, 54 y.o.   MRN: [id number]  HPI  Patient is here today for complete physical exam. Her mammogram is scheduled for October. Her Pap smear was last performed in 2014 and is not due again until 2017. She denies any vaginal bleeding. Her last colonoscopy was in 2012. Due to the history of polyps it is due again in 2017. She denies any rectal bleeding or melena. Patient's blood pressure today is slightly elevated at 141/96. She denies any chest pain shortness of breath or dyspnea on exertion. Her flu shot and her tetanus shot are up-to-date. Past Medical History  Diagnosis Date  . Glaucoma   . GERD (gastroesophageal reflux disease)   . Hemorrhoids   . IBS (irritable bowel syndrome)   . Hiatal hernia   . Abdominal pain   . Sinus problem   . N&V (nausea and vomiting)   . Rectal bleeding   . Rectal ulcer   . Allergy   . Vitamin D deficiency   . TMJ (dislocation of temporomandibular joint)   . Osteoporosis    Past Surgical History  Procedure Laterality Date  . Colonoscopy w/ polypectomy      colon ulcers  . Cholecystectomy  1984  . Hemorroidectomy    . Anal fissure repair    . Eye surgery    . Hemi-microdiscectomy lumbar laminectomy level 1 Left 05/26/2013    Procedure: HEMI-MICRODISCECTOMY LUMBAR LAMINECTOMY L4 - L5 ON THE LEFT LEVEL 1;  Surgeon: Tobi Bastos, MD;  Location: [facility];  Service: Orthopedics;  Laterality: Left;   Current Outpatient Prescriptions on File Prior to Visit  Medication Sig Dispense Refill  . cyclobenzaprine (FLEXERIL) 10 MG tablet Take 10 mg by mouth at bedtime.    Marland Kitchen dexlansoprazole (DEXILANT) 60 MG capsule Take 60 mg by mouth daily.    . fexofenadine (ALLEGRA) 180 MG tablet TAKE 1 TABLET BY MOUTH EVERY DAY 30 tablet 11  . fluticasone (FLONASE) 50 MCG/ACT nasal spray PLACE 2 SPRAYS INTO THE NOSE DAILY. 16 g 11  . gabapentin (NEURONTIN) 300 MG capsule Take 1 capsule (300 mg total) by  mouth 3 (three) times daily. 30 capsule 0  . hydrochlorothiazide (HYDRODIURIL) 25 MG tablet TAKE 1 TABLET BY MOUTH EVERY DAY 90 tablet 3  . PATADAY 0.2 % SOLN PLACE 1 DROP INTO BOTH EYES EVERY DAY 2.5 mL 6   No current facility-administered medications on file prior to visit.   Allergies  Allergen Reactions  . Hydroxyzine Pamoate Rash   History   Social History  . Marital Status: Single    Spouse Name: N/A  . Number of Children: N/A  . Years of Education: N/A   Occupational History  . Not on file.   Social History Main Topics  . Smoking status: Never Smoker   . Smokeless tobacco: Never Used  . Alcohol Use: No  . Drug Use: No  . Sexual Activity: Not on file   Other Topics Concern  . Not on file   Social History Narrative   Family History  Problem Relation Age of Onset  . Diabetes Mother   . Stroke Father       Review of Systems  All other systems reviewed and are negative.      Objective:   Physical Exam  Constitutional: She is oriented to person, place, and time. She appears well-developed and well-nourished. No distress.  HENT:  Head: Normocephalic and atraumatic.  Right Ear: External ear normal.  Left Ear: External ear normal.  Nose: Nose normal.  Mouth/Throat: Oropharynx is clear and moist. No oropharyngeal exudate.  Eyes: Conjunctivae and EOM are normal. Pupils are equal, round, and reactive to light. Right eye exhibits no discharge. Left eye exhibits no discharge. No scleral icterus.  Neck: Normal range of motion. Neck supple. No JVD present. No tracheal deviation present. No thyromegaly present.  Cardiovascular: Normal rate, regular rhythm, normal heart sounds and intact distal pulses.  Exam reveals no gallop and no friction rub.   No murmur heard. Pulmonary/Chest: Effort normal and breath sounds normal. No stridor. No respiratory distress. She has no wheezes. She has no rales. She exhibits no tenderness.  Abdominal: Soft. Bowel sounds are normal. She  exhibits no distension and no mass. There is no tenderness. There is no rebound and no guarding.  Musculoskeletal: Normal range of motion. She exhibits no edema or tenderness.  Lymphadenopathy:    She has no cervical adenopathy.  Neurological: She is alert and oriented to person, place, and time. She has normal reflexes. She displays normal reflexes. No cranial nerve deficit. She exhibits normal muscle tone. Coordination normal.  Skin: Skin is warm. No rash noted. She is not diaphoretic. No erythema. No pallor.  Psychiatric: She has a normal mood and affect. Her behavior is normal. Judgment and thought content normal.  Vitals reviewed.         Assessment & Plan:  Routine general medical examination at a health care facility - Plan: COMPLETE METABOLIC PANEL WITH GFR, CBC with Differential/Platelet, Lipid panel, TSH  Patient's physical exam today is relatively normal. Her cancer screening is up-to-date. Her immunizations are up-to-date. I will check a CBC, CMP, fasting lipid panel, and TSH. Her blood pressure is elevated today. I have asked the patient check her blood pressure everyday for the next 1-2 weeks and then notify me of the values so that I can make a determination whether or not she requires blood pressure medication. I blood pressure runs in her mother and her father and her brother.

## 2014-12-13 ENCOUNTER — Other Ambulatory Visit: Payer: Self-pay | Admitting: Family Medicine

## 2014-12-13 MED ORDER — ATORVASTATIN CALCIUM 20 MG PO TABS: 20.0000 mg | ORAL_TABLET | Freq: Every day | ORAL | Status: DC

## 2015-01-10 ENCOUNTER — Telehealth: Payer: Self-pay | Admitting: Family Medicine

## 2015-01-10 DIAGNOSIS — H20029 Recurrent acute iridocyclitis, unspecified eye: Secondary | ICD-10-CM

## 2015-01-10 NOTE — Telephone Encounter (Signed)
Spoke to [place] and the pt has had 2 bouts of Iritis and would like to know if we can do some labs for iritis work-up - she is faxing me OV notes and what he would like to have done.

## 2015-01-10 NOTE — Telephone Encounter (Signed)
Becky from dr beavis's office calling to speak with you regarding this patient  Please call her at [phone]

## 2015-01-12 NOTE — Telephone Encounter (Signed)
Tried to call pt to come in for BW - no answer and no vm

## 2015-01-17 NOTE — Telephone Encounter (Signed)
Pt aware and will come by to have drawn

## 2015-01-18 ENCOUNTER — Other Ambulatory Visit: Payer: Medicaid Other

## 2015-01-18 DIAGNOSIS — H20029 Recurrent acute iridocyclitis, unspecified eye: Secondary | ICD-10-CM

## 2015-01-18 LAB — CBC WITH DIFFERENTIAL/PLATELET
Basophils Absolute: 0 10*3/uL (ref 0.0–0.1)
Basophils Relative: 0 % (ref 0–1)
Eosinophils Absolute: 0 10*3/uL (ref 0.0–0.7)
Eosinophils Relative: 0 % (ref 0–5)
HCT: 40.7 % (ref 36.0–46.0)
Hemoglobin: 13.8 g/dL (ref 12.0–15.0)
Lymphocytes Relative: 37 % (ref 12–46)
Lymphs Abs: 2.2 10*3/uL (ref 0.7–4.0)
MCH: 29.2 pg (ref 26.0–34.0)
MCHC: 33.9 g/dL (ref 30.0–36.0)
MCV: 86 fL (ref 78.0–100.0)
MONOS PCT: 6 % (ref 3–12)
MPV: 10.3 fL (ref 8.6–12.4)
Monocytes Absolute: 0.4 10*3/uL (ref 0.1–1.0)
Neutro Abs: 3.4 10*3/uL (ref 1.7–7.7)
Neutrophils Relative %: 57 % (ref 43–77)
Platelets: 187 10*3/uL (ref 150–400)
RBC: 4.73 MIL/uL (ref 3.87–5.11)
RDW: 14.8 % (ref 11.5–15.5)
WBC: 5.9 10*3/uL (ref 4.0–10.5)

## 2015-01-19 LAB — SEDIMENTATION RATE: Sed Rate: 4 mm/hr (ref 0–30)

## 2015-01-19 LAB — RPR

## 2015-01-19 LAB — ANA: Anti Nuclear Antibody(ANA): NEGATIVE

## 2015-01-22 LAB — B. BURGDORFI ANTIBODIES BY WB
B BURGDORFERI IGG ABS (IB): NEGATIVE
B burgdorferi IgM Abs (IB): NEGATIVE

## 2015-01-22 LAB — HLA-B27 ANTIGEN: DNA Result:: NOT DETECTED

## 2015-02-12 ENCOUNTER — Other Ambulatory Visit: Payer: Self-pay | Admitting: Family Medicine

## 2015-02-13 ENCOUNTER — Other Ambulatory Visit: Payer: Self-pay | Admitting: Family Medicine

## 2015-03-04 ENCOUNTER — Emergency Department (INDEPENDENT_AMBULATORY_CARE_PROVIDER_SITE_OTHER)
Admission: EM | Admit: 2015-03-04 | Discharge: 2015-03-04 | Disposition: A | Discharge status: Home / Self Care | Payer: Medicaid Other | Source: Home / Self Care | Attending: Family Medicine | Admitting: Family Medicine

## 2015-03-04 ENCOUNTER — Encounter (HOSPITAL_COMMUNITY): Payer: Self-pay | Admitting: *Deleted

## 2015-03-04 DIAGNOSIS — N390 Urinary tract infection, site not specified: Secondary | ICD-10-CM

## 2015-03-04 LAB — POCT URINALYSIS DIP (DEVICE)
Bilirubin Urine: NEGATIVE
Glucose, UA: NEGATIVE mg/dL
HGB URINE DIPSTICK: NEGATIVE
KETONES UR: NEGATIVE mg/dL
Leukocytes, UA: NEGATIVE
Nitrite: NEGATIVE
PROTEIN: NEGATIVE mg/dL
Specific Gravity, Urine: 1.025 (ref 1.005–1.030)
UROBILINOGEN UA: 0.2 mg/dL (ref 0.0–1.0)
pH: 6.5 (ref 5.0–8.0)

## 2015-03-04 LAB — POCT PREGNANCY, URINE: Preg Test, Ur: NEGATIVE

## 2015-03-04 MED ORDER — CEPHALEXIN 500 MG PO CAPS: 500.0000 mg | ORAL_CAPSULE | Freq: Four times a day (QID) | ORAL | Status: DC

## 2015-03-04 NOTE — ED Provider Notes (Signed)
CSN: [id number]     Arrival date & time 03/04/15  1354 History   First MD Initiated Contact with Patient 03/04/15 1502     Chief Complaint  Patient presents with  . Urinary Tract Infection   (Consider location/radiation/quality/duration/timing/severity/associated sxs/prior Treatment) Patient is a 54 y.o. female presenting with urinary tract infection. The history is provided by the patient and a parent.  Urinary Tract Infection Pain quality:  Burning Pain severity:  Moderate Onset quality:  Gradual Duration:  4 days Progression:  Worsening Chronicity:  Chronic Recent urinary tract infections: no   Relieved by:  None tried Worsened by:  Nothing tried Ineffective treatments:  None tried Urinary symptoms: frequent urination   Associated symptoms: no fever, no flank pain, no nausea and no vomiting   Risk factors: recurrent urinary tract infections     Past Medical History  Diagnosis Date  . Glaucoma   . GERD (gastroesophageal reflux disease)   . Hemorrhoids   . IBS (irritable bowel syndrome)   . Hiatal hernia   . Abdominal pain   . Sinus problem   . N&V (nausea and vomiting)   . Rectal bleeding   . Rectal ulcer   . Allergy   . Vitamin D deficiency   . TMJ (dislocation of temporomandibular joint)   . Osteoporosis    Past Surgical History  Procedure Laterality Date  . Colonoscopy w/ polypectomy      colon ulcers  . Cholecystectomy  1984  . Hemorroidectomy    . Anal fissure repair    . Eye surgery    . Hemi-microdiscectomy lumbar laminectomy level 1 Left 05/26/2013    Procedure: HEMI-MICRODISCECTOMY LUMBAR LAMINECTOMY L4 - L5 ON THE LEFT LEVEL 1;  Surgeon: Tobi Bastos, MD;  Location: [facility];  Service: Orthopedics;  Laterality: Left;   Family History  Problem Relation Age of Onset  . Diabetes Mother   . Stroke Father    History  Substance Use Topics  . Smoking status: Never Smoker   . Smokeless tobacco: Never Used  . Alcohol Use: No   OB History    No data  available     Review of Systems  Constitutional: Negative.  Negative for fever and chills.  Gastrointestinal: Negative.  Negative for nausea and vomiting.  Genitourinary: Positive for dysuria, urgency and frequency. Negative for flank pain, difficulty urinating and dyspareunia.    Allergies  Hydroxyzine pamoate  Home Medications   Prior to Admission medications   Medication Sig Start Date End Date Taking? Authorizing Provider  atorvastatin (LIPITOR) 20 MG tablet Take 1 tablet (20 mg total) by mouth daily. 12/13/14   Susy Frizzle, MD  cyclobenzaprine (FLEXERIL) 10 MG tablet Take 10 mg by mouth at bedtime.    Historical Provider, MD  dexlansoprazole (DEXILANT) 60 MG capsule Take 60 mg by mouth daily.    Historical Provider, MD  fexofenadine (ALLEGRA) 180 MG tablet TAKE 1 TABLET BY MOUTH EVERY DAY 09/26/13   Susy Frizzle, MD  fluticasone (FLONASE) 50 MCG/ACT nasal spray PLACE 2 SPRAYS INTO THE NOSE DAILY. 02/13/15   Susy Frizzle, MD  gabapentin (NEURONTIN) 300 MG capsule Take 1 capsule (300 mg total) by mouth 3 (three) times daily. 05/27/13   Amber Cecilio Asper, PA-C  hydrochlorothiazide (HYDRODIURIL) 25 MG tablet TAKE 1 TABLET BY MOUTH EVERY DAY 02/13/15   Susy Frizzle, MD  PATADAY 0.2 % SOLN PLACE 1 DROP INTO BOTH EYES EVERY DAY 10/25/14   Susy Frizzle, MD  BP 138/82 mmHg  Pulse 78  Temp(Src) 98.6 F (37 C) (Oral)  Resp 18  SpO2 100% Physical Exam  Constitutional: She is oriented to person, place, and time. She appears well-developed and well-nourished. No distress.  Neck: Normal range of motion. Neck supple.  Abdominal: Soft. Normal appearance and bowel sounds are normal. She exhibits no distension and no mass. There is tenderness in the suprapubic area. There is no rigidity, no rebound, no guarding and no CVA tenderness.  Lymphadenopathy:    She has no cervical adenopathy.  Neurological: She is alert and oriented to person, place, and time.  Skin: Skin is warm and  dry.  Nursing note and vitals reviewed.   ED Course  Procedures (including critical care time) Labs Review Labs Reviewed  URINE CULTURE  POCT URINALYSIS DIP (DEVICE)  POCT PREGNANCY, URINE    Imaging Review No results found.   MDM   1. UTI (lower urinary tract infection)        Billy Fischer, MD 03/04/15 [phone]

## 2015-03-04 NOTE — ED Notes (Signed)
Pt  Reports   Symptoms  Of   Discomfort   When  She  Urinates   With  Pressure  History  Of  uti  In past

## 2015-03-04 NOTE — Discharge Instructions (Signed)
Take all of medicine as directed, drink lots of fluids, see your doctor if further problems. °

## 2015-03-06 LAB — URINE CULTURE: Special Requests: NORMAL

## 2015-03-06 NOTE — ED Notes (Signed)
Final report shows multiple species present. treatment adequate w Rx provided

## 2015-03-17 ENCOUNTER — Other Ambulatory Visit: Payer: Self-pay | Admitting: Family Medicine

## 2015-03-21 ENCOUNTER — Other Ambulatory Visit: Payer: Self-pay | Admitting: Family Medicine

## 2015-03-22 ENCOUNTER — Other Ambulatory Visit: Payer: Self-pay | Admitting: Family Medicine

## 2015-03-24 ENCOUNTER — Encounter (HOSPITAL_COMMUNITY): Payer: Self-pay | Admitting: Emergency Medicine

## 2015-03-24 ENCOUNTER — Emergency Department (INDEPENDENT_AMBULATORY_CARE_PROVIDER_SITE_OTHER)
Admission: EM | Admit: 2015-03-24 | Discharge: 2015-03-24 | Disposition: A | Discharge status: Home / Self Care | Payer: Medicaid Other | Source: Home / Self Care

## 2015-03-24 DIAGNOSIS — N390 Urinary tract infection, site not specified: Secondary | ICD-10-CM

## 2015-03-24 LAB — POCT URINALYSIS DIP (DEVICE)
Bilirubin Urine: NEGATIVE
Glucose, UA: NEGATIVE mg/dL
Hgb urine dipstick: NEGATIVE
Ketones, ur: NEGATIVE mg/dL
Nitrite: POSITIVE — AB
Protein, ur: NEGATIVE mg/dL
Specific Gravity, Urine: 1.025 (ref 1.005–1.030)
Urobilinogen, UA: 0.2 mg/dL (ref 0.0–1.0)
pH: 6.5 (ref 5.0–8.0)

## 2015-03-24 MED ORDER — PHENAZOPYRIDINE HCL 200 MG PO TABS: 200.0000 mg | ORAL_TABLET | Freq: Three times a day (TID) | ORAL | Status: DC

## 2015-03-24 MED ORDER — CIPROFLOXACIN HCL 500 MG PO TABS: 500.0000 mg | ORAL_TABLET | Freq: Two times a day (BID) | ORAL | Status: DC

## 2015-03-24 NOTE — Discharge Instructions (Signed)
See Dr. Dennard Schaumann by Friday to make sure the infection is cleUrinary Tract Infection Urinary tract infections (UTIs) can develop anywhere along your urinary tract. Your urinary tract is your body's drainage system for removing wastes and extra water. Your urinary tract includes two kidneys, two ureters, a bladder, and a urethra. Your kidneys are a pair of bean-shaped organs. Each kidney is about the size of your fist. They are located below your ribs, one on each side of your spine. CAUSES Infections are caused by microbes, which are microscopic organisms, including fungi, viruses, and bacteria. These organisms are so small that they can only be seen through a microscope. Bacteria are the microbes that most commonly cause UTIs. SYMPTOMS  Symptoms of UTIs may vary by age and gender of the patient and by the location of the infection. Symptoms in young women typically include a frequent and intense urge to urinate and a painful, burning feeling in the bladder or urethra during urination. Older women and men are more likely to be tired, shaky, and weak and have muscle aches and abdominal pain. A fever may mean the infection is in your kidneys. Other symptoms of a kidney infection include pain in your back or sides below the ribs, nausea, and vomiting. DIAGNOSIS To diagnose a UTI, your caregiver will ask you about your symptoms. Your caregiver also will ask to provide a urine sample. The urine sample will be tested for bacteria and white blood cells. White blood cells are made by your body to help fight infection. TREATMENT  Typically, UTIs can be treated with medication. Because most UTIs are caused by a bacterial infection, they usually can be treated with the use of antibiotics. The choice of antibiotic and length of treatment depend on your symptoms and the type of bacteria causing your infection. HOME CARE INSTRUCTIONS  If you were prescribed antibiotics, take them exactly as your caregiver instructs you.  Finish the medication even if you feel better after you have only taken some of the medication.  Drink enough water and fluids to keep your urine clear or pale yellow.  Avoid caffeine, tea, and carbonated beverages. They tend to irritate your bladder.  Empty your bladder often. Avoid holding urine for long periods of time.  Empty your bladder before and after sexual intercourse.  After a bowel movement, women should cleanse from front to back. Use each tissue only once. SEEK MEDICAL CARE IF:   You have back pain.  You develop a fever.  Your symptoms do not begin to resolve within 3 days. SEEK IMMEDIATE MEDICAL CARE IF:   You have severe back pain or lower abdominal pain.  You develop chills.  You have nausea or vomiting.  You have continued burning or discomfort with urination. MAKE SURE YOU:   Understand these instructions.  Will watch your condition.  Will get help right away if you are not doing well or get worse. Document Released: 06/11/2005 Document Revised: 03/02/2012 Document Reviewed: 10/10/2011 [facility] Patient Information 2015 [place], [place]. This information is not intended to replace advice given to you by your health care provider. Make sure you discuss any questions you have with your health care provider. aring

## 2015-03-24 NOTE — ED Notes (Signed)
C/o uti States she has abd pain  States burns when urinating Was taking keflex

## 2015-03-24 NOTE — ED Provider Notes (Signed)
CSN: [id number]     Arrival date & time 03/24/15  1301 History   First MD Initiated Contact with Patient 03/24/15 1320     Chief Complaint  Patient presents with  . Urinary Tract Infection   (Consider location/radiation/quality/duration/timing/severity/associated sxs/prior Treatment) Patient is a 54 y.o. female presenting with urinary tract infection. The history is provided by the patient.  Urinary Tract Infection Pain quality:  Burning and sharp Pain severity:  Severe Onset quality:  Gradual Duration:  3 days Timing:  Constant Progression:  Worsening Chronicity:  Recurrent Recent urinary tract infections: yes   Relieved by:  Nothing Ineffective treatments:  Acetaminophen Urinary symptoms: discolored urine, foul-smelling urine, frequent urination, hematuria and hesitancy   Associated symptoms: abdominal pain, fever, flank pain, nausea and vomiting     Past Medical History  Diagnosis Date  . Glaucoma   . GERD (gastroesophageal reflux disease)   . Hemorrhoids   . IBS (irritable bowel syndrome)   . Hiatal hernia   . Abdominal pain   . Sinus problem   . N&V (nausea and vomiting)   . Rectal bleeding   . Rectal ulcer   . Allergy   . Vitamin D deficiency   . TMJ (dislocation of temporomandibular joint)   . Osteoporosis    Past Surgical History  Procedure Laterality Date  . Colonoscopy w/ polypectomy      colon ulcers  . Cholecystectomy  1984  . Hemorroidectomy    . Anal fissure repair    . Eye surgery    . Hemi-microdiscectomy lumbar laminectomy level 1 Left 05/26/2013    Procedure: HEMI-MICRODISCECTOMY LUMBAR LAMINECTOMY L4 - L5 ON THE LEFT LEVEL 1;  Surgeon: Tobi Bastos, MD;  Location: [facility];  Service: Orthopedics;  Laterality: Left;   Family History  Problem Relation Age of Onset  . Diabetes Mother   . Stroke Father    History  Substance Use Topics  . Smoking status: Never Smoker   . Smokeless tobacco: Never Used  . Alcohol Use: No   OB History    No  data available     Review of Systems  Constitutional: Positive for fever.  Gastrointestinal: Positive for nausea, vomiting and abdominal pain.  Genitourinary: Positive for flank pain.  Musculoskeletal: Positive for back pain.    Allergies  Hydroxyzine pamoate  Home Medications   Prior to Admission medications   Medication Sig Start Date End Date Taking? Authorizing Provider  atorvastatin (LIPITOR) 20 MG tablet Take 1 tablet (20 mg total) by mouth daily. 12/13/14   Susy Frizzle, MD  cephALEXin (KEFLEX) 500 MG capsule Take 1 capsule (500 mg total) by mouth 4 (four) times daily. Take all of medicine and drink lots of fluids 03/04/15   Billy Fischer, MD  cephALEXin (KEFLEX) 500 MG capsule Take 1 capsule (500 mg total) by mouth 4 (four) times daily. Take all of medicine and drink lots of fluids 03/04/15   Billy Fischer, MD  cyclobenzaprine (FLEXERIL) 10 MG tablet Take 10 mg by mouth at bedtime.    Historical Provider, MD  dexlansoprazole (DEXILANT) 60 MG capsule Take 60 mg by mouth daily.    Historical Provider, MD  fexofenadine (ALLEGRA) 180 MG tablet TAKE 1 TABLET BY MOUTH EVERY DAY 09/26/13   Susy Frizzle, MD  fluticasone (FLONASE) 50 MCG/ACT nasal spray PLACE 2 SPRAYS INTO THE NOSE DAILY. 02/13/15   Susy Frizzle, MD  fluticasone (FLONASE) 50 MCG/ACT nasal spray PLACE 2 SPRAYS INTO THE NOSE DAILY.  03/20/15   Susy Frizzle, MD  fluticasone (FLONASE) 50 MCG/ACT nasal spray PLACE 2 SPRAYS INTO THE NOSE DAILY. 03/21/15   Susy Frizzle, MD  fluticasone (FLONASE) 50 MCG/ACT nasal spray PLACE 2 SPRAYS INTO THE NOSE DAILY. 03/22/15   Susy Frizzle, MD  gabapentin (NEURONTIN) 300 MG capsule Take 1 capsule (300 mg total) by mouth 3 (three) times daily. 05/27/13   Amber Cecilio Asper, PA-C  hydrochlorothiazide (HYDRODIURIL) 25 MG tablet TAKE 1 TABLET BY MOUTH EVERY DAY 02/13/15   Susy Frizzle, MD  hydrochlorothiazide (HYDRODIURIL) 25 MG tablet TAKE 1 TABLET BY MOUTH EVERY DAY 03/20/15   Susy Frizzle, MD  hydrochlorothiazide (HYDRODIURIL) 25 MG tablet TAKE 1 TABLET BY MOUTH EVERY DAY 03/21/15   Susy Frizzle, MD  hydrochlorothiazide (HYDRODIURIL) 25 MG tablet TAKE 1 TABLET BY MOUTH EVERY DAY 03/22/15   Susy Frizzle, MD  PATADAY 0.2 % SOLN PLACE 1 DROP INTO BOTH EYES EVERY DAY 10/25/14   Susy Frizzle, MD   BP 147/90 mmHg  Pulse 111  Temp(Src) 99.3 F (37.4 C) (Oral)  Resp 16  SpO2 99% Physical Exam  Constitutional: She appears well-developed and well-nourished.  HENT:  Head: Normocephalic and atraumatic.  Right Ear: External ear normal.  Left Ear: External ear normal.  Eyes: Conjunctivae and EOM are normal. Pupils are equal, round, and reactive to light.  Neck: Normal range of motion. Neck supple.  Cardiovascular: Normal rate.   Pulmonary/Chest: Effort normal.  Abdominal: Soft. There is tenderness.  Tender LLQ and left flank  Musculoskeletal: Normal range of motion. She exhibits no edema.  Skin: Skin is warm and dry.  Nursing note and vitals reviewed.   ED Course  Procedures (including critical care time) Labs Review Labs Reviewed  POCT URINALYSIS DIP (DEVICE) - Abnormal; Notable for the following:    Nitrite POSITIVE (*)    Leukocytes, UA TRACE (*)    All other components within normal limits  URINE CULTURE    Imaging Review No results found.   MDM  54 yo disabled woman secondary to LBP with recurrent UTI, last one being one month ago, now with symptoms of upper urinary tract manifestations.  She needs close follow up and further evaluation by urology.  Patient told to see PCP in 1 week for repeat testing, or, if symptoms worsen, go to ED  \cipro and Urine culture ordered.  Robyn Haber, MD    Robyn Haber, MD 03/24/15 1332

## 2015-03-26 LAB — URINE CULTURE: Culture: >=100000

## 2015-03-27 NOTE — ED Notes (Signed)
Attempted to inform of lab report, no answer on phone

## 2015-03-29 ENCOUNTER — Telehealth (HOSPITAL_COMMUNITY): Payer: Self-pay

## 2015-03-29 ENCOUNTER — Encounter: Payer: Self-pay | Admitting: Family Medicine

## 2015-03-29 ENCOUNTER — Ambulatory Visit (INDEPENDENT_AMBULATORY_CARE_PROVIDER_SITE_OTHER): Payer: Medicaid Other | Admitting: Family Medicine

## 2015-03-29 VITALS — BP 140/88 | HR 100 | Temp 98.9°F | Resp 18 | Ht 62.5 in | Wt 140.0 lb

## 2015-03-29 DIAGNOSIS — R300 Dysuria: Secondary | ICD-10-CM | POA: Diagnosis not present

## 2015-03-29 DIAGNOSIS — I10 Essential (primary) hypertension: Secondary | ICD-10-CM | POA: Diagnosis not present

## 2015-03-29 LAB — URINALYSIS, ROUTINE W REFLEX MICROSCOPIC
BILIRUBIN URINE: NEGATIVE
Glucose, UA: NEGATIVE mg/dL
KETONES UR: NEGATIVE mg/dL
Nitrite: NEGATIVE
PROTEIN: NEGATIVE mg/dL
Specific Gravity, Urine: 1.02 (ref 1.005–1.030)
Urobilinogen, UA: 0.2 mg/dL (ref 0.0–1.0)
pH: 5.5 (ref 5.0–8.0)

## 2015-03-29 LAB — URINALYSIS, MICROSCOPIC ONLY
Casts: NONE SEEN
Crystals: NONE SEEN

## 2015-03-29 MED ORDER — LOSARTAN POTASSIUM 100 MG PO TABS: 100.0000 mg | ORAL_TABLET | Freq: Every day | ORAL | Status: DC

## 2015-03-29 NOTE — Progress Notes (Signed)
Subjective:    Patient ID: Heather Gillespie, female    DOB: [birth year], 54 y.o.   MRN: [id number]  HPI  Patient went to an urgent care on July 9. Urine culture revealed Escherichia coli that was sensitive to Cipro. She was treated with Cipro. The symptoms have improved. She continues to complain of urinary retention no. She blames this on hydrochlorothiazide. She states that ever since she started this medication she is at a difficult time emptying her bladder. She is also on Flexeril, Allegra, and gabapentin. She also has chronic low back pain but she denies any symptoms of saddle anesthesia. Past Medical History  Diagnosis Date  . Glaucoma   . GERD (gastroesophageal reflux disease)   . Hemorrhoids   . IBS (irritable bowel syndrome)   . Hiatal hernia   . Abdominal pain   . Sinus problem   . N&V (nausea and vomiting)   . Rectal bleeding   . Rectal ulcer   . Allergy   . Vitamin D deficiency   . TMJ (dislocation of temporomandibular joint)   . Osteoporosis    Past Surgical History  Procedure Laterality Date  . Colonoscopy w/ polypectomy      colon ulcers  . Cholecystectomy  1984  . Hemorroidectomy    . Anal fissure repair    . Eye surgery    . Hemi-microdiscectomy lumbar laminectomy level 1 Left 05/26/2013    Procedure: HEMI-MICRODISCECTOMY LUMBAR LAMINECTOMY L4 - L5 ON THE LEFT LEVEL 1;  Surgeon: Tobi Bastos, MD;  Location: [facility];  Service: Orthopedics;  Laterality: Left;   Current Outpatient Prescriptions on File Prior to Visit  Medication Sig Dispense Refill  . cyclobenzaprine (FLEXERIL) 10 MG tablet Take 10 mg by mouth at bedtime.    Marland Kitchen dexlansoprazole (DEXILANT) 60 MG capsule Take 60 mg by mouth daily.    . fexofenadine (ALLEGRA) 180 MG tablet TAKE 1 TABLET BY MOUTH EVERY DAY 30 tablet 11  . fluticasone (FLONASE) 50 MCG/ACT nasal spray PLACE 2 SPRAYS INTO THE NOSE DAILY. 16 g 3  . gabapentin (NEURONTIN) 300 MG capsule Take 1 capsule (300 mg total) by mouth 3  (three) times daily. 30 capsule 0  . hydrochlorothiazide (HYDRODIURIL) 25 MG tablet TAKE 1 TABLET BY MOUTH EVERY DAY 90 tablet 3  . PATADAY 0.2 % SOLN PLACE 1 DROP INTO BOTH EYES EVERY DAY 2.5 mL 6  . phenazopyridine (PYRIDIUM) 200 MG tablet Take 1 tablet (200 mg total) by mouth 3 (three) times daily with meals. 9 tablet 0  . atorvastatin (LIPITOR) 20 MG tablet Take 1 tablet (20 mg total) by mouth daily. (Patient not taking: Reported on 03/29/2015) 30 tablet 3   No current facility-administered medications on file prior to visit.   Allergies  Allergen Reactions  . Hydroxyzine Pamoate Rash   History   Social History  . Marital Status: Single    Spouse Name: N/A  . Number of Children: N/A  . Years of Education: N/A   Occupational History  . Not on file.   Social History Main Topics  . Smoking status: Never Smoker   . Smokeless tobacco: Never Used  . Alcohol Use: No  . Drug Use: No  . Sexual Activity: Not on file   Other Topics Concern  . Not on file   Social History Narrative     Review of Systems  All other systems reviewed and are negative.      Objective:   Physical Exam  Cardiovascular: Normal  rate, regular rhythm and normal heart sounds.   Pulmonary/Chest: Effort normal and breath sounds normal. No respiratory distress. She has no wheezes. She has no rales.  Abdominal: Soft. Bowel sounds are normal.  Vitals reviewed.         Assessment & Plan:  Dysuria - Plan: Urinalysis, Routine w reflex microscopic (not at [facility])  Benign essential HTN - Plan: losartan (COZAAR) 100 MG tablet  Clinically the urinary tract infection appears to have resolved. Her urinalysis today is essentially normal. It does look like it may be some mild contamination. I'm more concerned about her urinary retention. I recommended that we discontinue hydrochlorothiazide and replace it with losartan 100 mg by mouth daily. I've also recommended that she discontinue taking Allegra and switch to  Flonase. If symptoms persist, will need to evaluate further.

## 2015-03-29 NOTE — ED Notes (Signed)
Attempted to call to advise to finish Rx as written. No answer on phone, no voice mail .

## 2015-03-30 ENCOUNTER — Telehealth: Payer: Self-pay | Admitting: Family Medicine

## 2015-03-30 NOTE — Telephone Encounter (Signed)
PA submitted to [facility] for Losartan 100 mg  Conf # [id number]

## 2015-04-03 MED ORDER — LISINOPRIL 40 MG PO TABS: 40.0000 mg | ORAL_TABLET | Freq: Every day | ORAL | Status: DC

## 2015-04-03 NOTE — Telephone Encounter (Signed)
Losartan has been denied.  Must have tried and failed an ACE inhibitor.  Please advise?

## 2015-04-03 NOTE — Telephone Encounter (Signed)
New Rx to pharmacy, pt made aware

## 2015-04-03 NOTE — Telephone Encounter (Signed)
Try lisinopril 40 mg poqd.

## 2015-04-07 ENCOUNTER — Other Ambulatory Visit: Payer: Self-pay | Admitting: Family Medicine

## 2015-04-10 ENCOUNTER — Other Ambulatory Visit: Payer: Self-pay | Admitting: Family Medicine

## 2015-04-11 ENCOUNTER — Other Ambulatory Visit: Payer: Self-pay | Admitting: Family Medicine

## 2015-04-12 ENCOUNTER — Telehealth: Payer: Self-pay | Admitting: Family Medicine

## 2015-04-12 MED ORDER — LOSARTAN POTASSIUM 100 MG PO TABS: 100.0000 mg | ORAL_TABLET | Freq: Every day | ORAL | Status: DC

## 2015-04-12 NOTE — Telephone Encounter (Signed)
Spoke with Dr Dennard Schaumann and Pharmacy.  Insurance will now cover Losartan.  Losartan 100 mg called into pharmacy.  Pt called and informed.  Reinforced to her to let us know if has any problems with medication.

## 2015-04-12 NOTE — Telephone Encounter (Signed)
Says since starting Lisinopril she having dizziness, headache, vomiting.  BP at home have been 128/70 (around in there)

## 2015-04-12 NOTE — Telephone Encounter (Signed)
HCTZ has been discontinued, refill denied

## 2015-05-10 ENCOUNTER — Telehealth: Payer: Self-pay | Admitting: Family Medicine

## 2015-05-10 NOTE — Telephone Encounter (Signed)
[phone] pt taking losartan 100mg  qd  Per wtp decrease losartan to 50mg  qd and check bp's and call us back   Pt aware and will cut what she has in half

## 2015-05-16 ENCOUNTER — Other Ambulatory Visit: Payer: Self-pay | Admitting: Family Medicine

## 2015-06-21 ENCOUNTER — Ambulatory Visit
Admission: RE | Admit: 2015-06-21 | Discharge: 2015-06-21 | Disposition: A | Discharge status: Home / Self Care | Payer: Medicaid Other | Source: Ambulatory Visit

## 2015-06-21 DIAGNOSIS — Z1231 Encounter for screening mammogram for malignant neoplasm of breast: Secondary | ICD-10-CM

## 2015-10-09 LAB — HM COLONOSCOPY

## 2015-10-14 ENCOUNTER — Other Ambulatory Visit: Payer: Self-pay | Admitting: Family Medicine

## 2015-10-15 ENCOUNTER — Encounter: Payer: Self-pay | Admitting: *Deleted

## 2015-11-11 ENCOUNTER — Other Ambulatory Visit: Payer: Self-pay | Admitting: Family Medicine

## 2015-12-10 ENCOUNTER — Other Ambulatory Visit: Payer: Self-pay | Admitting: Family Medicine

## 2015-12-12 ENCOUNTER — Other Ambulatory Visit: Payer: Medicaid Other

## 2015-12-12 DIAGNOSIS — R7989 Other specified abnormal findings of blood chemistry: Secondary | ICD-10-CM

## 2015-12-12 DIAGNOSIS — I10 Essential (primary) hypertension: Secondary | ICD-10-CM

## 2015-12-12 DIAGNOSIS — Z79899 Other long term (current) drug therapy: Secondary | ICD-10-CM

## 2015-12-12 DIAGNOSIS — E785 Hyperlipidemia, unspecified: Secondary | ICD-10-CM

## 2015-12-12 LAB — CBC WITH DIFFERENTIAL/PLATELET
BASOS PCT: 1 % (ref 0–1)
Basophils Absolute: 0 10*3/uL (ref 0.0–0.1)
Eosinophils Absolute: 0 10*3/uL (ref 0.0–0.7)
Eosinophils Relative: 1 % (ref 0–5)
HEMATOCRIT: 39.4 % (ref 36.0–46.0)
HEMOGLOBIN: 13.7 g/dL (ref 12.0–15.0)
Lymphocytes Relative: 45 % (ref 12–46)
Lymphs Abs: 1.9 10*3/uL (ref 0.7–4.0)
MCH: 31 pg (ref 26.0–34.0)
MCHC: 34.8 g/dL (ref 30.0–36.0)
MCV: 89.1 fL (ref 78.0–100.0)
MPV: 9.8 fL (ref 8.6–12.4)
Monocytes Absolute: 0.2 10*3/uL (ref 0.1–1.0)
Monocytes Relative: 5 % (ref 3–12)
NEUTROS ABS: 2 10*3/uL (ref 1.7–7.7)
Neutrophils Relative %: 48 % (ref 43–77)
PLATELETS: 161 10*3/uL (ref 150–400)
RBC: 4.42 MIL/uL (ref 3.87–5.11)
RDW: 13.4 % (ref 11.5–15.5)
WBC: 4.2 10*3/uL (ref 4.0–10.5)

## 2015-12-12 LAB — COMPREHENSIVE METABOLIC PANEL
ALK PHOS: 74 U/L (ref 33–130)
ALT: 7 U/L (ref 6–29)
AST: 14 U/L (ref 10–35)
Albumin: 4.4 g/dL (ref 3.6–5.1)
BUN: 9 mg/dL (ref 7–25)
CO2: 23 mmol/L (ref 20–31)
CREATININE: 0.62 mg/dL (ref 0.50–1.05)
Calcium: 9.6 mg/dL (ref 8.6–10.4)
Chloride: 103 mmol/L (ref 98–110)
GLUCOSE: 72 mg/dL (ref 70–99)
Potassium: 4 mmol/L (ref 3.5–5.3)
SODIUM: 140 mmol/L (ref 135–146)
Total Bilirubin: 0.7 mg/dL (ref 0.2–1.2)
Total Protein: 7.2 g/dL (ref 6.1–8.1)

## 2015-12-12 LAB — LIPID PANEL
Cholesterol: 214 mg/dL — ABNORMAL HIGH (ref 125–200)
HDL: 63 mg/dL (ref >=46)
LDL CALC: 133 mg/dL — AB (ref <130)
TRIGLYCERIDES: 91 mg/dL (ref <150)
Total CHOL/HDL Ratio: 3.4 Ratio (ref <=5.0)
VLDL: 18 mg/dL (ref <30)

## 2015-12-12 LAB — TSH: TSH: 1.13 mIU/L

## 2015-12-13 LAB — VITAMIN D 25 HYDROXY (VIT D DEFICIENCY, FRACTURES): Vit D, 25-Hydroxy: 13 ng/mL — ABNORMAL LOW (ref 30–100)

## 2015-12-14 ENCOUNTER — Encounter: Payer: Self-pay | Admitting: Family Medicine

## 2015-12-14 ENCOUNTER — Ambulatory Visit (INDEPENDENT_AMBULATORY_CARE_PROVIDER_SITE_OTHER): Payer: Medicaid Other | Admitting: Family Medicine

## 2015-12-14 VITALS — BP 136/78 | HR 82 | Temp 98.3°F | Resp 16 | Ht 62.5 in | Wt 143.0 lb

## 2015-12-14 DIAGNOSIS — M810 Age-related osteoporosis without current pathological fracture: Secondary | ICD-10-CM

## 2015-12-14 DIAGNOSIS — Z0000 Encounter for general adult medical examination without abnormal findings: Secondary | ICD-10-CM | POA: Diagnosis not present

## 2015-12-14 MED ORDER — ALENDRONATE SODIUM 70 MG PO TABS: 70.0000 mg | ORAL_TABLET | ORAL | Status: DC

## 2015-12-14 NOTE — Progress Notes (Signed)
Subjective:    Patient ID: Heather Gillespie, female    DOB: 05-10-1961, 55 y.o.   MRN: [id number]  HPI   Patient is here today for complete physical exam. Her mammogram is scheduled for October. Her Pap smear is due today. She denies any vaginal bleeding. Her colonoscopy was performed in 1/17.  Her blood pressure today is excellent. I have reviewed her most recent lab work with the patient and her lab work is excellent. She has dropped her LDL cholesterol substantially and her HDL cholesterol remains elevated. Her vitamin D is low and I have recommended starting to take 2000 units a day of vitamin D and definitely. Otherwise she is complaining of pain in her left leg. She has pain radiating from her lower back down her left leg. This is been chronic since her surgery for spinal stenosis. She is taking gabapentin 300 mg at night. This helped some but not enough.  Lab on 12/12/2015  Component Date Value Ref Range Status  . WBC 12/12/2015 4.2  4.0 - 10.5 K/uL Final  . RBC 12/12/2015 4.42  3.87 - 5.11 MIL/uL Final  . Hemoglobin 12/12/2015 13.7  12.0 - 15.0 g/dL Final  . HCT 12/12/2015 39.4  36.0 - 46.0 % Final  . MCV 12/12/2015 89.1  78.0 - 100.0 fL Final  . MCH 12/12/2015 31.0  26.0 - 34.0 pg Final  . MCHC 12/12/2015 34.8  30.0 - 36.0 g/dL Final  . RDW 12/12/2015 13.4  11.5 - 15.5 % Final  . Platelets 12/12/2015 161  150 - 400 K/uL Final  . MPV 12/12/2015 9.8  8.6 - 12.4 fL Final  . Neutrophils Relative % 12/12/2015 48  43 - 77 % Final  . Neutro Abs 12/12/2015 2.0  1.7 - 7.7 K/uL Final  . Lymphocytes Relative 12/12/2015 45  12 - 46 % Final  . Lymphs Abs 12/12/2015 1.9  0.7 - 4.0 K/uL Final  . Monocytes Relative 12/12/2015 5  3 - 12 % Final  . Monocytes Absolute 12/12/2015 0.2  0.1 - 1.0 K/uL Final  . Eosinophils Relative 12/12/2015 1  0 - 5 % Final  . Eosinophils Absolute 12/12/2015 0.0  0.0 - 0.7 K/uL Final  . Basophils Relative 12/12/2015 1  0 - 1 % Final  . Basophils Absolute  12/12/2015 0.0  0.0 - 0.1 K/uL Final  . Smear Review 12/12/2015 Criteria for review not met   Final  . Sodium 12/12/2015 140  135 - 146 mmol/L Final  . Potassium 12/12/2015 4.0  3.5 - 5.3 mmol/L Final  . Chloride 12/12/2015 103  98 - 110 mmol/L Final  . CO2 12/12/2015 23  20 - 31 mmol/L Final  . Glucose, Bld 12/12/2015 72  70 - 99 mg/dL Final  . BUN 12/12/2015 9  7 - 25 mg/dL Final  . Creat 12/12/2015 0.62  0.50 - 1.05 mg/dL Final  . Total Bilirubin 12/12/2015 0.7  0.2 - 1.2 mg/dL Final  . Alkaline Phosphatase 12/12/2015 74  33 - 130 U/L Final  . AST 12/12/2015 14  10 - 35 U/L Final  . ALT 12/12/2015 7  6 - 29 U/L Final  . Total Protein 12/12/2015 7.2  6.1 - 8.1 g/dL Final  . Albumin 12/12/2015 4.4  3.6 - 5.1 g/dL Final  . Calcium 12/12/2015 9.6  8.6 - 10.4 mg/dL Final  . Cholesterol 12/12/2015 214* 125 - 200 mg/dL Final  . Triglycerides 12/12/2015 91  <150 mg/dL Final  . HDL 12/12/2015 63  >=  46 mg/dL Final  . Total CHOL/HDL Ratio 12/12/2015 3.4  <=5.0 Ratio Final  . VLDL 12/12/2015 18  <30 mg/dL Final  . LDL Cholesterol 12/12/2015 133* <130 mg/dL Final   Comment:   Total Cholesterol/HDL Ratio:CHD Risk                        Coronary Heart Disease Risk Table                                        Men       Women          1/2 Average Risk              3.4        3.3              Average Risk              5.0        4.4           2X Average Risk              9.6        7.1           3X Average Risk             23.4       11.0 Use the calculated Patient Ratio above and the CHD Risk table  to determine the patient's CHD Risk.   Marland Kitchen TSH 12/12/2015 1.13   Final   Comment:   Reference Range   > or = 20 Years  0.40-4.50   Pregnancy Range First trimester  0.26-2.66 Second trimester 0.55-2.73 Third trimester  0.43-2.91     . Vit D, 25-Hydroxy 12/12/2015 13* 30 - 100 ng/mL Final   Comment: Vitamin D Status           25-OH Vitamin D        Deficiency                <20 ng/mL         Insufficiency         20 - 29 ng/mL        Optimal             > or = 30 ng/mL   For 25-OH Vitamin D testing on patients on D2-supplementation and patients for whom quantitation of D2 and D3 fractions is required, the QuestAssureD 25-OH VIT D, (D2,D3), LC/MS/MS is recommended: order code [id number] (patients > 2 yrs).     Past Medical History  Diagnosis Date  . Glaucoma   . GERD (gastroesophageal reflux disease)   . Hemorrhoids   . IBS (irritable bowel syndrome)   . Hiatal hernia   . Abdominal pain   . Sinus problem   . N&V (nausea and vomiting)   . Rectal bleeding   . Rectal ulcer   . Allergy   . Vitamin D deficiency   . TMJ (dislocation of temporomandibular joint)   . Osteoporosis    Past Surgical History  Procedure Laterality Date  . Colonoscopy w/ polypectomy      colon ulcers  . Cholecystectomy  1984  . Hemorroidectomy    . Anal fissure repair    . Eye surgery    . Hemi-microdiscectomy lumbar laminectomy level 1 Left 05/26/2013    Procedure:  HEMI-MICRODISCECTOMY LUMBAR LAMINECTOMY L4 - L5 ON THE LEFT LEVEL 1;  Surgeon: Tobi Bastos, MD;  Location: [facility];  Service: Orthopedics;  Laterality: Left;   Current Outpatient Prescriptions on File Prior to Visit  Medication Sig Dispense Refill  . atorvastatin (LIPITOR) 20 MG tablet Take 1 tablet (20 mg total) by mouth daily. 30 tablet 3  . cyclobenzaprine (FLEXERIL) 10 MG tablet Take 10 mg by mouth at bedtime.    Marland Kitchen dexlansoprazole (DEXILANT) 60 MG capsule Take 60 mg by mouth daily.    . fexofenadine (ALLEGRA) 180 MG tablet TAKE 1 TABLET BY MOUTH EVERY DAY 30 tablet 11  . fexofenadine (ALLEGRA) 180 MG tablet TAKE 1 TABLET BY MOUTH EVERY DAY 30 tablet 11  . fluticasone (FLONASE) 50 MCG/ACT nasal spray PLACE 2 SPRAYS INTO THE NOSE DAILY. 16 g 3  . gabapentin (NEURONTIN) 300 MG capsule Take 1 capsule (300 mg total) by mouth 3 (three) times daily. 30 capsule 0  . losartan (COZAAR) 100 MG tablet Take 1 tablet (100 mg total) by  mouth daily. 30 tablet 2  . PATADAY 0.2 % SOLN PLACE 1 DROP INTO BOTH EYES EVERY DAY 2.5 mL 6   No current facility-administered medications on file prior to visit.   Allergies  Allergen Reactions  . Hydroxyzine Pamoate Rash   Social History   Social History  . Marital Status: Single    Spouse Name: N/A  . Number of Children: N/A  . Years of Education: N/A   Occupational History  . Not on file.   Social History Main Topics  . Smoking status: Never Smoker   . Smokeless tobacco: Never Used  . Alcohol Use: No  . Drug Use: No  . Sexual Activity: Not on file   Other Topics Concern  . Not on file   Social History Narrative   Family History  Problem Relation Age of Onset  . Diabetes Mother   . Stroke Father       Review of Systems  All other systems reviewed and are negative.      Objective:   Physical Exam  Constitutional: She is oriented to person, place, and time. She appears well-developed and well-nourished. No distress.  HENT:  Head: Normocephalic and atraumatic.  Right Ear: External ear normal.  Left Ear: External ear normal.  Nose: Nose normal.  Mouth/Throat: Oropharynx is clear and moist. No oropharyngeal exudate.  Eyes: Conjunctivae and EOM are normal. Pupils are equal, round, and reactive to light. Right eye exhibits no discharge. Left eye exhibits no discharge. No scleral icterus.  Neck: Normal range of motion. Neck supple. No JVD present. No tracheal deviation present. No thyromegaly present.  Cardiovascular: Normal rate, regular rhythm, normal heart sounds and intact distal pulses.  Exam reveals no gallop and no friction rub.   No murmur heard. Pulmonary/Chest: Effort normal and breath sounds normal. No stridor. No respiratory distress. She has no wheezes. She has no rales. She exhibits no tenderness.  Abdominal: Soft. Bowel sounds are normal. She exhibits no distension and no mass. There is no tenderness. There is no rebound and no guarding.    Musculoskeletal: Normal range of motion. She exhibits no edema or tenderness.  Lymphadenopathy:    She has no cervical adenopathy.  Neurological: She is alert and oriented to person, place, and time. She has normal reflexes. No cranial nerve deficit. She exhibits normal muscle tone. Coordination normal.  Skin: Skin is warm. No rash noted. She is not diaphoretic. No erythema. No  pallor.  Psychiatric: She has a normal mood and affect. Her behavior is normal. Judgment and thought content normal.  Vitals reviewed.         Assessment & Plan:  Routine general medical examination at a health care facility - Plan: PAP, Thin Prep w/HPV rflx HPV Type 16/18  Patient's physical exam today is relatively normal. Her cancer screening is up-to-date. Her immunizations are up-to-date. Pap smear was sent to pathology in a labeled container. Lab work is excellent. Begin vitamin D 2000 units a day indefinitely. Patient has osteoporosis. Therefore I recommended that she begin to take Fosamax 70 mg a week and recheck a bone density test in 2 years. I recommended she increase gabapentin to 600 mg at night.

## 2015-12-18 LAB — PAP, THIN PREP W/HPV RFLX HPV TYPE 16/18: HPV DNA HIGH RISK: NOT DETECTED

## 2016-03-21 ENCOUNTER — Other Ambulatory Visit: Payer: Self-pay | Admitting: Family Medicine

## 2016-03-21 NOTE — Telephone Encounter (Signed)
Refill appropriate and filled per protocol. 

## 2016-03-22 ENCOUNTER — Other Ambulatory Visit: Payer: Self-pay | Admitting: Family Medicine

## 2016-04-30 ENCOUNTER — Other Ambulatory Visit: Payer: Self-pay | Admitting: Family Medicine

## 2016-05-05 IMAGING — MG MM SCREEN MAMMOGRAM BILATERAL
4 series · 4 of 4 positions shown · non-contrast
Comparison: Previous exam(s).

CLINICAL DATA: Screening.

EXAM:
DIGITAL SCREENING BILATERAL MAMMOGRAM WITH CAD

[R CC]
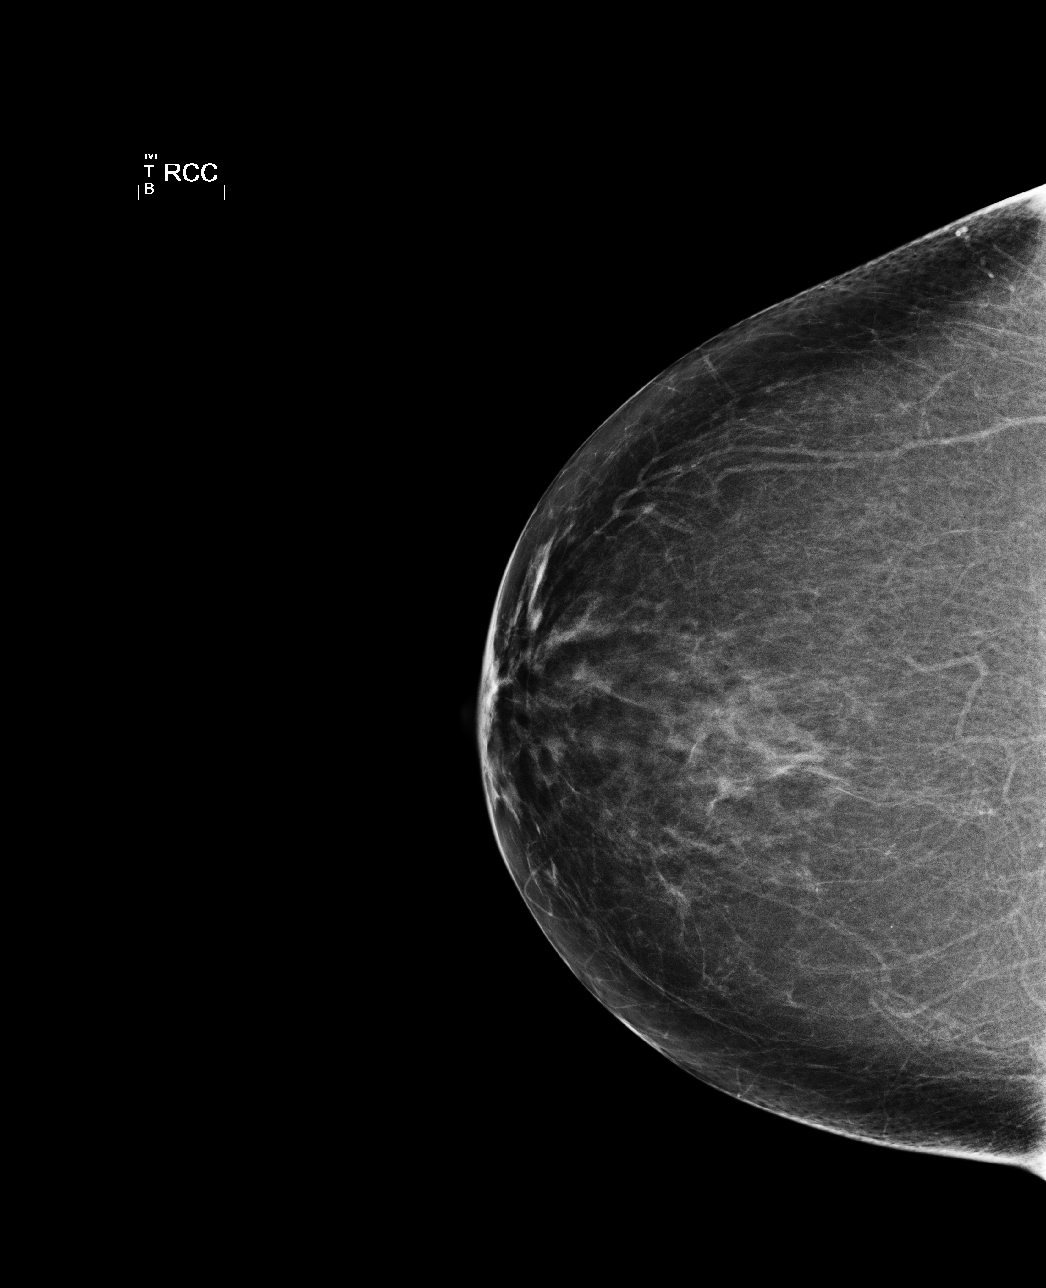

[L CC]
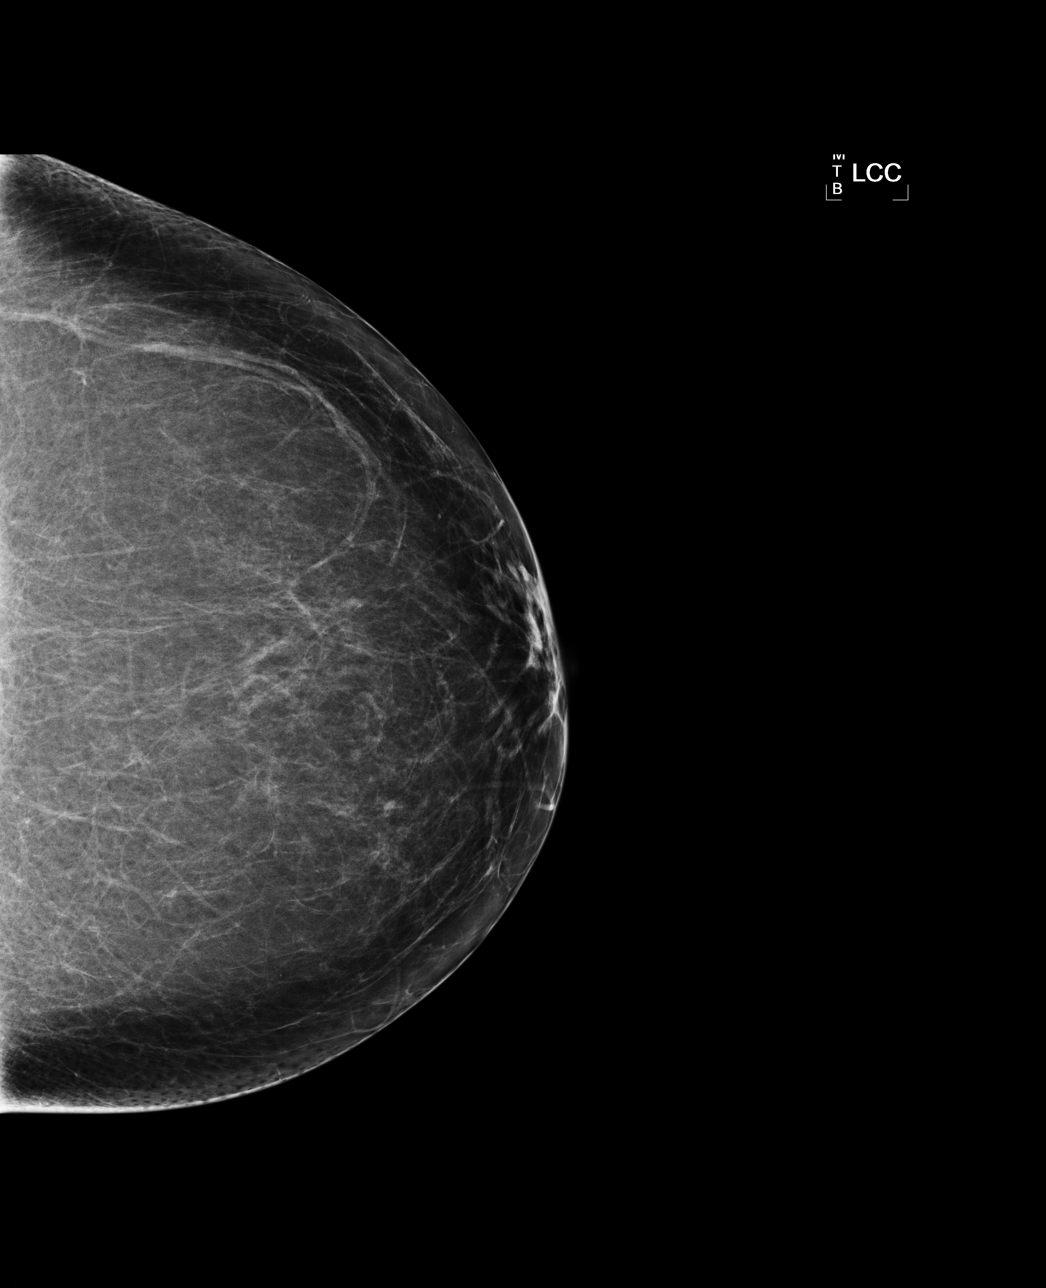

[L MLO]
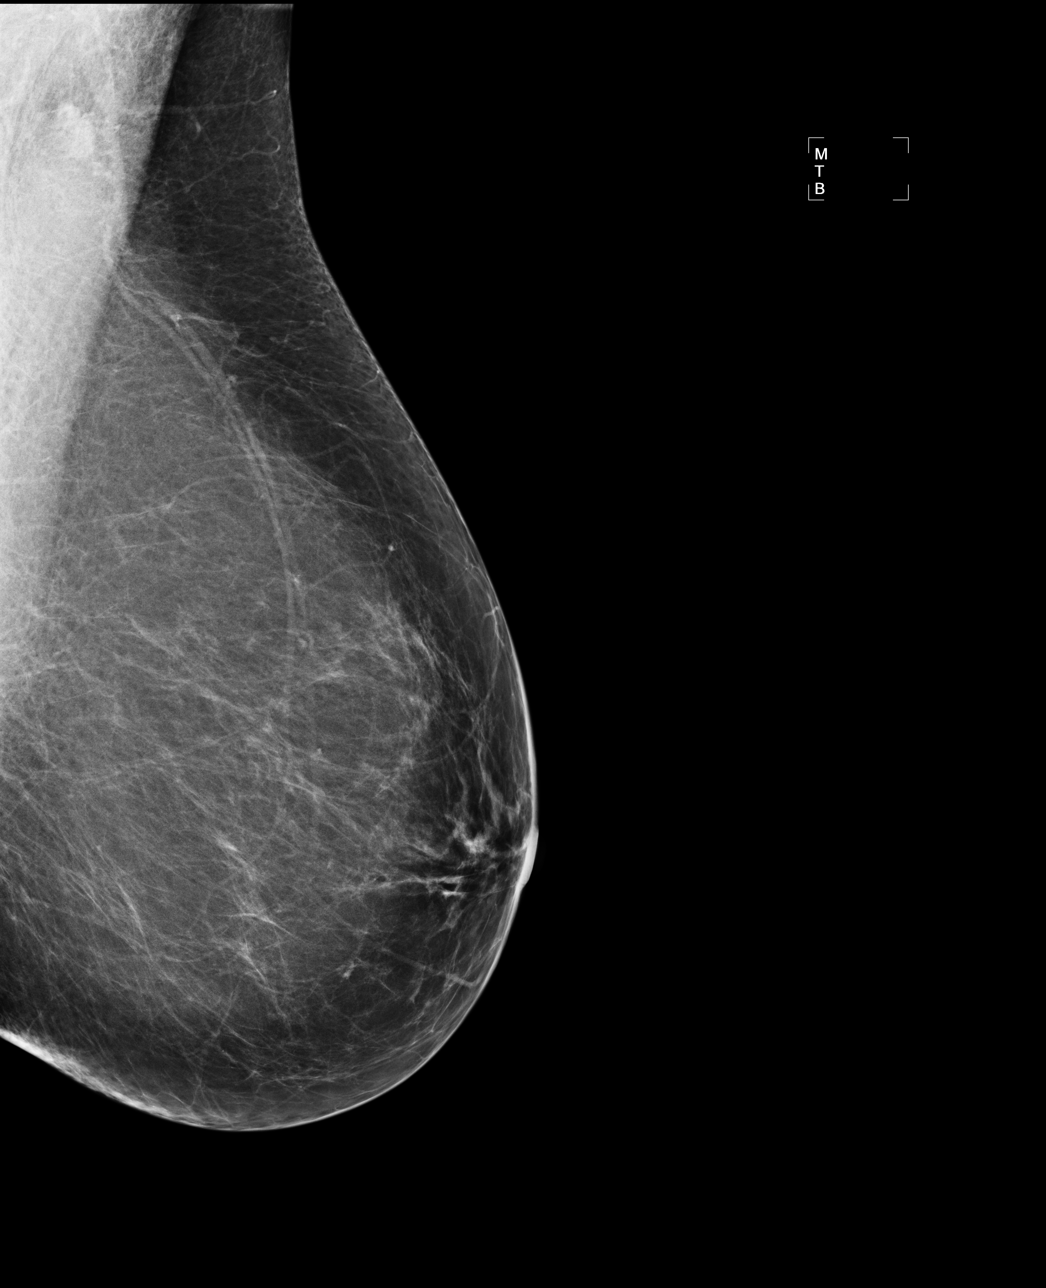

[R MLO]
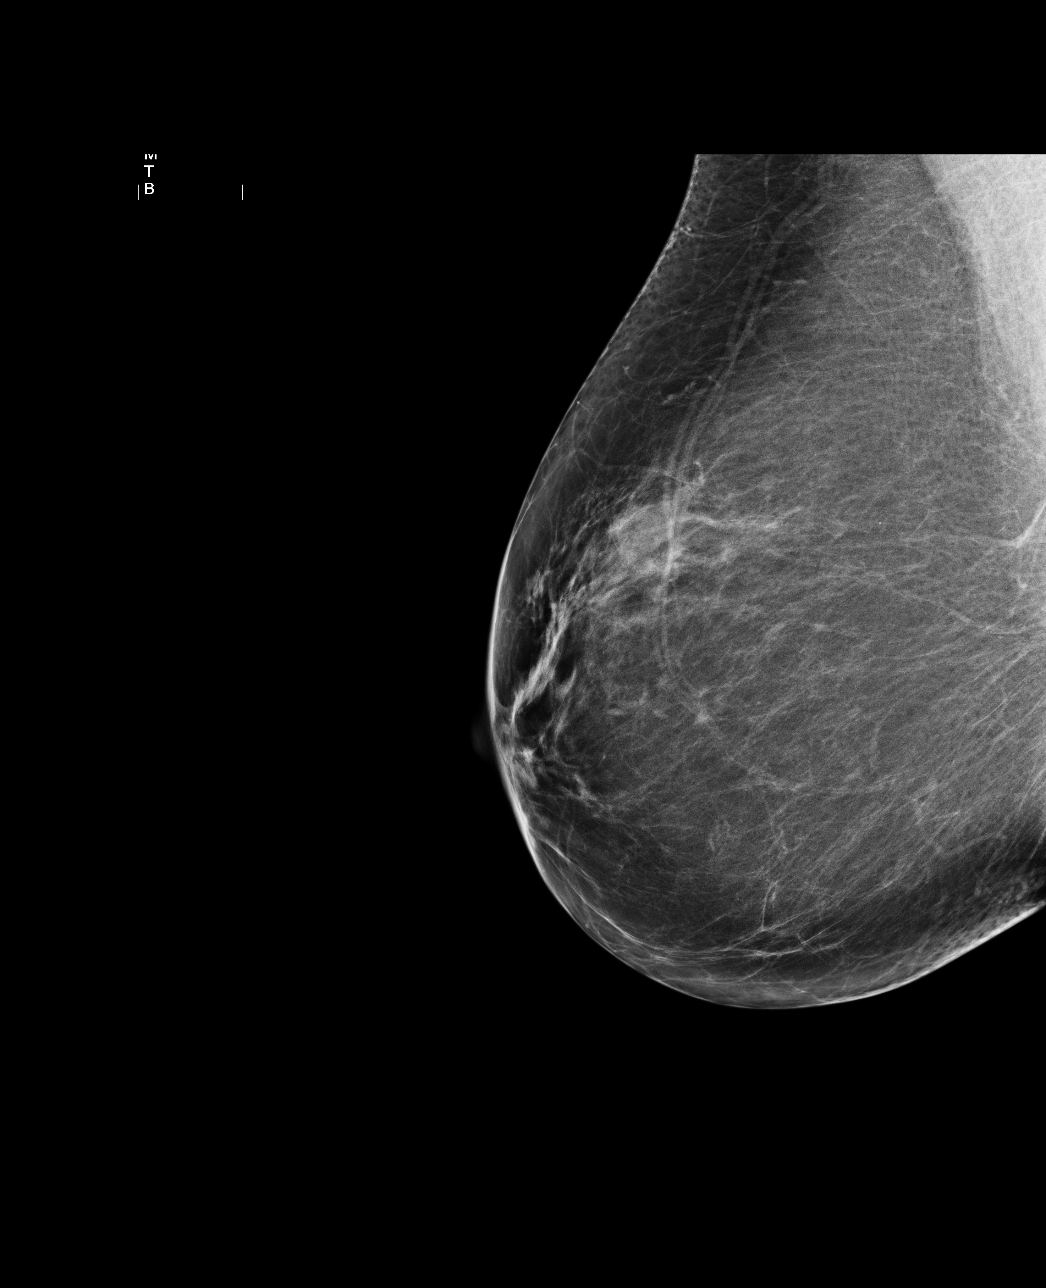

[4 of 4 positions shown; findings below may reference images not displayed]

ACR Breast Density Category b: There are scattered areas of
fibroglandular density.
FINDINGS: There are no findings suspicious for malignancy. Images were
processed with CAD.
IMPRESSION: No mammographic evidence of malignancy. A result letter of this
screening mammogram will be mailed directly to the patient.

RECOMMENDATION:
Screening mammogram in one year. (Code:AS-G-LCT)

BI-RADS CATEGORY  1: Negative.

## 2016-05-21 ENCOUNTER — Encounter: Payer: Self-pay | Admitting: Family Medicine

## 2016-05-21 ENCOUNTER — Ambulatory Visit (INDEPENDENT_AMBULATORY_CARE_PROVIDER_SITE_OTHER): Payer: Medicaid Other | Admitting: Family Medicine

## 2016-05-21 VITALS — BP 128/74 | HR 68 | Temp 97.5°F | Resp 14 | Ht 62.5 in | Wt 146.0 lb

## 2016-05-21 DIAGNOSIS — N390 Urinary tract infection, site not specified: Secondary | ICD-10-CM

## 2016-05-21 DIAGNOSIS — R3129 Other microscopic hematuria: Secondary | ICD-10-CM | POA: Diagnosis not present

## 2016-05-21 LAB — URINALYSIS, ROUTINE W REFLEX MICROSCOPIC
Bilirubin Urine: NEGATIVE
Glucose, UA: NEGATIVE
KETONES UR: NEGATIVE
Leukocytes, UA: NEGATIVE
NITRITE: NEGATIVE
Specific Gravity, Urine: 1.015 (ref 1.001–1.035)
pH: 6 (ref 5.0–8.0)

## 2016-05-21 LAB — URINALYSIS, MICROSCOPIC ONLY
BACTERIA UA: NONE SEEN [HPF]
Casts: NONE SEEN [LPF]
Crystals: NONE SEEN [HPF]
WBC UA: NONE SEEN WBC/HPF (ref <=5)
YEAST: NONE SEEN [HPF]

## 2016-05-21 MED ORDER — CIPROFLOXACIN HCL 500 MG PO TABS: 500.0000 mg | ORAL_TABLET | Freq: Two times a day (BID) | ORAL | 0 refills | Status: DC

## 2016-05-21 MED ORDER — CEFTRIAXONE SODIUM 1 G IJ SOLR
500.0000 mg | Freq: Once | INTRAMUSCULAR | Status: AC
  Administered 2016-05-21: 500 mg via INTRAMUSCULAR

## 2016-05-21 NOTE — Patient Instructions (Signed)
Take antibiotics Tylenol for headache We will call with results F/U as needed

## 2016-05-21 NOTE — Progress Notes (Signed)
   Subjective:    Patient ID: Heather Gillespie, female    DOB: 06/04/1961, 55 y.o.   MRN: [id number]  Patient presents for Dysuria (x10 days- flank pain, lower abd pain, burning with urination)  Urinary frequency, pressure, dysuria for past 10 days, history of UTI. Has also had fever 102F yesterday, associated with flank pain and headache. Saw blood in urine 1 day. She also complains of headache since she has been sick. She takes Tylenol as needed which helps. She denies any change in her vision. 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 She states she feels like her previous urinary tract infections. Of note her mother also has a urinary tract infection that was treated at urgent care 2 days ago     Review Of Systems:  GEN- denies fatigue, fever, weight loss,weakness, recent illness HEENT- denies eye drainage, change in vision, nasal discharge, CVS- denies chest pain, palpitations RESP- denies SOB, cough, wheeze ABD- denies N/V, change in stools, abd pain GU- denies dysuria, hematuria, dribbling, incontinence MSK- denies joint pain, muscle aches, injury Neuro- denies headache, dizziness, syncope, seizure activity       Objective:    BP 128/74 (BP Location: Left Arm, Patient Position: Sitting, Cuff Size: Normal)   Pulse 68   Temp 97.5 F (36.4 C) (Oral)   Resp 14   Ht 5' 2.5" (1.588 m)   Wt 146 lb (66.2 kg)   BMI 26.28 kg/m  GEN- NAD, alert and oriented x3,flat affect  HEENT- PERRL, EOMI, non injected sclera, pink conjunctiva, MMM, oropharynx clear,fundus benign appearing  Neck- Supple, no thyromegaly,FROM no stiffiness noted  CVS- RRR, no murmur RESP-CTAB ABD-NABS,soft,,ND, mild TTP bilat flanks, TTP suprapubic region  Neuro- CNII-XII in tact EXT- No edema Pulses- Radial  2+        Assessment & Plan:      Problem List Items Addressed This Visit    DYSURIA - Primary    Concern for possible UTI she has not given the classic sign of kidney stone  but she does have blood in the urine. We will go ahead and start antibiotics she has had Escherichia coli resistant to Bactrim in the past therefore will place her on Cipro. Culture will be sent. With regards to the headache no neurological deficits noted. She does not have any fever today no sign of any meningitis. We'll just have her take over-the-counter pain medication      Relevant Medications   cefTRIAXone (ROCEPHIN) injection 500 mg (Completed)    Other Visit Diagnoses    Microscopic hematuria      Check renal function with hematuria noted If pain does not improve plan for CT to r/o stone   Relevant Orders   Comprehensive metabolic panel      Note: This dictation was prepared with Dragon dictation along with smaller phrase technology. Any transcriptional errors that result from this process are unintentional.

## 2016-05-21 NOTE — Assessment & Plan Note (Signed)
Concern for possible UTI she has not given the classic sign of kidney stone but she does have blood in the urine. We will go ahead and start antibiotics she has had Escherichia coli resistant to Bactrim in the past therefore will place her on Cipro. Culture will be sent. With regards to the headache no neurological deficits noted. She does not have any fever today no sign of any meningitis. We'll just have her take over-the-counter pain medication

## 2016-05-22 LAB — URINE CULTURE: Organism ID, Bacteria: 10000

## 2016-06-02 ENCOUNTER — Other Ambulatory Visit: Payer: Self-pay | Admitting: Family Medicine

## 2016-06-02 DIAGNOSIS — Z1231 Encounter for screening mammogram for malignant neoplasm of breast: Secondary | ICD-10-CM

## 2016-06-14 ENCOUNTER — Ambulatory Visit (HOSPITAL_COMMUNITY)
Admission: EM | Admit: 2016-06-14 | Discharge: 2016-06-14 | Disposition: A | Discharge status: Home / Self Care | Payer: Medicaid Other | Attending: Family Medicine | Admitting: Family Medicine

## 2016-06-14 ENCOUNTER — Encounter (HOSPITAL_COMMUNITY): Payer: Self-pay | Admitting: Emergency Medicine

## 2016-06-14 DIAGNOSIS — N390 Urinary tract infection, site not specified: Secondary | ICD-10-CM

## 2016-06-14 LAB — POCT URINALYSIS DIP (DEVICE)
Bilirubin Urine: NEGATIVE
Glucose, UA: NEGATIVE mg/dL
Ketones, ur: NEGATIVE mg/dL
Leukocytes, UA: NEGATIVE
NITRITE: POSITIVE — AB
PH: 6 (ref 5.0–8.0)
PROTEIN: 100 mg/dL — AB
Specific Gravity, Urine: 1.025 (ref 1.005–1.030)
Urobilinogen, UA: 0.2 mg/dL (ref 0.0–1.0)

## 2016-06-14 MED ORDER — PHENAZOPYRIDINE HCL 200 MG PO TABS: 200.0000 mg | ORAL_TABLET | Freq: Three times a day (TID) | ORAL | 0 refills | Status: DC | PRN

## 2016-06-14 MED ORDER — CIPROFLOXACIN HCL 500 MG PO TABS: 500.0000 mg | ORAL_TABLET | Freq: Two times a day (BID) | ORAL | 0 refills | Status: DC

## 2016-06-14 NOTE — ED Provider Notes (Signed)
CSN: [id number]     Arrival date & time 06/14/16  1219 History   First MD Initiated Contact with Patient 06/14/16 1331     Chief Complaint  Patient presents with  . Urinary Tract Infection   (Consider location/radiation/quality/duration/timing/severity/associated sxs/prior Treatment) HPI History obtained from patient:   I have a urinary tract infection   7 days ago began to have frequency of urination 5-6 times per day, urine burns when "it hits the skin," occasional vaginal itching.  No relief from increased fluids. Lowgrade fever no chills. Recent past history of urinary   infection.  Past Medical History:  Diagnosis Date  . Abdominal pain   . Allergy   . GERD (gastroesophageal reflux disease)   . Glaucoma   . Hemorrhoids   . Hiatal hernia   . IBS (irritable bowel syndrome)   . N&V (nausea and vomiting)   . Osteoporosis   . Rectal bleeding   . Rectal ulcer   . Sinus problem   . TMJ (dislocation of temporomandibular joint)   . Vitamin D deficiency    Past Surgical History:  Procedure Laterality Date  . ANAL FISSURE REPAIR    . CHOLECYSTECTOMY  1984  . COLONOSCOPY W/ POLYPECTOMY     colon ulcers  . EYE SURGERY    . HEMI-MICRODISCECTOMY LUMBAR LAMINECTOMY LEVEL 1 Left 05/26/2013   Procedure: HEMI-MICRODISCECTOMY LUMBAR LAMINECTOMY L4 - L5 ON THE LEFT LEVEL 1;  Surgeon: Tobi Bastos, MD;  Location: [facility];  Service: Orthopedics;  Laterality: Left;  . HEMORROIDECTOMY     Family History  Problem Relation Age of Onset  . Diabetes Mother   . Stroke Father    Social History  Substance Use Topics  . Smoking status: Never Smoker  . Smokeless tobacco: Never Used  . Alcohol use No   OB History    No data available     Review of Systems  Denies: HEADACHE, NAUSEA, ABDOMINAL PAIN, CHEST PAIN, CONGESTION,  SHORTNESS OF BREATH  Allergies  Hydroxyzine pamoate  Home Medications   Prior to Admission medications   Medication Sig Start Date End Date Taking? Authorizing  Provider  atorvastatin (LIPITOR) 20 MG tablet Take 1 tablet (20 mg total) by mouth daily. 12/13/14  Yes Susy Frizzle, MD  cyclobenzaprine (FLEXERIL) 10 MG tablet Take 10 mg by mouth at bedtime.   Yes Historical Provider, MD  dexlansoprazole (DEXILANT) 60 MG capsule Take 60 mg by mouth daily.   Yes Historical Provider, MD  fexofenadine (ALLEGRA) 180 MG tablet TAKE 1 TABLET BY MOUTH EVERY DAY 10/15/15  Yes Susy Frizzle, MD  fluticasone (FLONASE) 50 MCG/ACT nasal spray PLACE 2 SPRAYS INTO THE NOSE DAILY. 03/24/16  Yes Susy Frizzle, MD  gabapentin (NEURONTIN) 300 MG capsule TAKE 1 CAPSULE THREE TIMES A DAY 04/30/16  Yes Susy Frizzle, MD  alendronate (FOSAMAX) 70 MG tablet Take 1 tablet (70 mg total) by mouth every 7 (seven) days. Take with a full glass of water on an empty stomach. 12/14/15   Susy Frizzle, MD  ciprofloxacin (CIPRO) 500 MG tablet Take 1 tablet (500 mg total) by mouth 2 (two) times daily. 05/21/16   Alycia Rossetti, MD  ciprofloxacin (CIPRO) 500 MG tablet Take 1 tablet (500 mg total) by mouth every 12 (twelve) hours. 06/14/16   Konrad Felix, PA  losartan (COZAAR) 100 MG tablet TAKE 1 TABLET EVERY DAY 03/21/16   Susy Frizzle, MD  PATADAY 0.2 % SOLN PLACE 1 DROP INTO  BOTH EYES EVERY DAY 12/10/15   Susy Frizzle, MD  phenazopyridine (PYRIDIUM) 200 MG tablet Take 1 tablet (200 mg total) by mouth 3 (three) times daily as needed for pain. 06/14/16   Konrad Felix, PA   Meds Ordered and Administered this Visit  Medications - No data to display  BP 151/84 (BP Location: Left Arm)   Pulse 89   Temp 98.6 F (37 C) (Oral)   Resp 18   SpO2 100%  No data found.   Physical Exam NURSES NOTES AND VITAL SIGNS REVIEWED. CONSTITUTIONAL: Well developed, well nourished, no acute distress HEENT: normocephalic, atraumatic EYES: Conjunctiva normal NECK:normal ROM, supple, no adenopathy PULMONARY:No respiratory distress, normal effort ABDOMINAL: Soft, ND, NT BS+, No  CVAT MUSCULOSKELETAL: Normal ROM of all extremities,  SKIN: warm and dry without rash PSYCHIATRIC: Mood and affect, behavior are normal  Urgent Care Course   Clinical Course   Reviewed previous culture reports (contamination) Procedures (including critical care time)  Labs Review Labs Reviewed  POCT URINALYSIS DIP (DEVICE) - Abnormal; Notable for the following:       Result Value   Hgb urine dipstick LARGE (*)    Protein, ur 100 (*)    Nitrite POSITIVE (*)    All other components within normal limits    Imaging Review No results found.   Visual Acuity Review  Right Eye Distance:   Left Eye Distance:   Bilateral Distance:    Right Eye Near:   Left Eye Near:    Bilateral Near:         MDM   1. UTI (lower urinary tract infection)     Patient is reassured that there are no issues that require transfer to higher level of care at this time or additional tests. Patient is advised to continue home symptomatic treatment. Patient is advised that if there are new or worsening symptoms to attend the emergency department, contact primary care provider, or return to UC. Instructions of care provided discharged home in stable condition.    THIS NOTE WAS GENERATED USING A VOICE RECOGNITION SOFTWARE PROGRAM. ALL REASONABLE EFFORTS  WERE MADE TO PROOFREAD THIS DOCUMENT FOR ACCURACY.  I have verbally reviewed the discharge instructions with the patient. A printed AVS was given to the patient.  All questions were answered prior to discharge.      Konrad Felix, [place] 06/14/16 1349

## 2016-06-14 NOTE — ED Triage Notes (Signed)
Pt c/o UTI sx onset 7 days associated w/back/abd pain, fevers, nausea, dysuria, urinary freq/urgency  A&O x4... NAD

## 2016-06-25 ENCOUNTER — Ambulatory Visit
Admission: RE | Admit: 2016-06-25 | Discharge: 2016-06-25 | Disposition: A | Discharge status: Home / Self Care | Payer: Medicaid Other | Source: Ambulatory Visit | Attending: Family Medicine | Admitting: Family Medicine

## 2016-06-25 DIAGNOSIS — Z1231 Encounter for screening mammogram for malignant neoplasm of breast: Secondary | ICD-10-CM

## 2016-07-05 ENCOUNTER — Encounter (HOSPITAL_COMMUNITY): Payer: Self-pay | Admitting: Family Medicine

## 2016-07-05 ENCOUNTER — Ambulatory Visit (HOSPITAL_COMMUNITY)
Admission: EM | Admit: 2016-07-05 | Discharge: 2016-07-05 | Disposition: A | Discharge status: Home / Self Care | Payer: Medicaid Other | Attending: Family Medicine | Admitting: Family Medicine

## 2016-07-05 DIAGNOSIS — K047 Periapical abscess without sinus: Secondary | ICD-10-CM

## 2016-07-05 MED ORDER — DICLOFENAC POTASSIUM 50 MG PO TABS: 50.0000 mg | ORAL_TABLET | Freq: Three times a day (TID) | ORAL | 0 refills | Status: DC

## 2016-07-05 MED ORDER — CLINDAMYCIN HCL 150 MG PO CAPS: 150.0000 mg | ORAL_CAPSULE | Freq: Four times a day (QID) | ORAL | 0 refills | Status: DC

## 2016-07-05 NOTE — ED Provider Notes (Signed)
[facility]    CSN: [id number] Arrival date & time: 07/05/16  1352     History   Chief Complaint Chief Complaint  Patient presents with  . Dental Pain    HPI Heather Gillespie is a 55 y.o. female.   The history is provided by the patient.  Dental Pain  Location:  Lower Lower teeth location:  22/LL cuspid Quality:  Throbbing Severity:  Moderate Duration:  2 days Progression:  Worsening Chronicity:  New Context: abscess, poor dentition and recent dental surgery   Context comment:  Root canal in aug. Ineffective treatments:  None tried Associated symptoms: gum swelling   Associated symptoms: no facial swelling     Past Medical History:  Diagnosis Date  . Abdominal pain   . Allergy   . GERD (gastroesophageal reflux disease)   . Glaucoma   . Hemorrhoids   . Hiatal hernia   . IBS (irritable bowel syndrome)   . N&V (nausea and vomiting)   . Osteoporosis   . Rectal bleeding   . Rectal ulcer   . Sinus problem   . TMJ (dislocation of temporomandibular joint)   . Vitamin D deficiency     Patient Active Problem List   Diagnosis Date Noted  . Spinal stenosis, lumbar region, with neurogenic claudication 05/26/2013  . 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 Vitamin D deficiency   . TMJ (dislocation of temporomandibular joint)   . Osteoporosis   . Rectal pain 03/31/2011  . ANEMIA, IRON DEFICIENCY NOS 02/19/2007  . [place] SYNDROME 02/19/2007  . HEMORRHOIDS NOS, W/O COMPLICATIONS [id number]  . RHINITIS, ALLERGIC NOS 02/19/2007  . IBS 02/19/2007  . DIARRHEA, CHRONIC 02/19/2007  . DYSURIA 02/19/2007    Past Surgical History:  Procedure Laterality Date  . ANAL FISSURE REPAIR    . CHOLECYSTECTOMY  1984  . COLONOSCOPY W/ POLYPECTOMY     colon ulcers  . EYE SURGERY    . HEMI-MICRODISCECTOMY LUMBAR LAMINECTOMY LEVEL 1 Left 05/26/2013   Procedure: HEMI-MICRODISCECTOMY LUMBAR LAMINECTOMY L4 - L5 ON THE LEFT  LEVEL 1;  Surgeon: Tobi Bastos, MD;  Location: [facility];  Service: Orthopedics;  Laterality: Left;  . HEMORROIDECTOMY      OB History    No data available       Home Medications    Prior to Admission medications   Medication Sig Start Date End Date Taking? Authorizing Provider  alendronate (FOSAMAX) 70 MG tablet Take 1 tablet (70 mg total) by mouth every 7 (seven) days. Take with a full glass of water on an empty stomach. 12/14/15   Susy Frizzle, MD  atorvastatin (LIPITOR) 20 MG tablet Take 1 tablet (20 mg total) by mouth daily. 12/13/14   Susy Frizzle, MD  ciprofloxacin (CIPRO) 500 MG tablet Take 1 tablet (500 mg total) by mouth 2 (two) times daily. 05/21/16   Alycia Rossetti, MD  ciprofloxacin (CIPRO) 500 MG tablet Take 1 tablet (500 mg total) by mouth every 12 (twelve) hours. 06/14/16   Konrad Felix, PA  cyclobenzaprine (FLEXERIL) 10 MG tablet Take 10 mg by mouth at bedtime.    Historical Provider, MD  dexlansoprazole (DEXILANT) 60 MG capsule Take 60 mg by mouth daily.    Historical Provider, MD  fexofenadine (ALLEGRA) 180 MG tablet TAKE 1 TABLET BY MOUTH EVERY DAY 10/15/15   Susy Frizzle, MD  fluticasone (FLONASE) 50 MCG/ACT nasal spray PLACE 2 SPRAYS INTO THE NOSE DAILY. 03/24/16  Susy Frizzle, MD  gabapentin (NEURONTIN) 300 MG capsule TAKE 1 CAPSULE THREE TIMES A DAY 04/30/16   Susy Frizzle, MD  losartan (COZAAR) 100 MG tablet TAKE 1 TABLET EVERY DAY 03/21/16   Susy Frizzle, MD  PATADAY 0.2 % SOLN PLACE 1 DROP INTO BOTH EYES EVERY DAY 12/10/15   Susy Frizzle, MD  phenazopyridine (PYRIDIUM) 200 MG tablet Take 1 tablet (200 mg total) by mouth 3 (three) times daily as needed for pain. 06/14/16   Konrad Felix, PA    Family History Family History  Problem Relation Age of Onset  . Diabetes Mother   . Stroke Father     Social History Social History  Substance Use Topics  . Smoking status: Never Smoker  . Smokeless tobacco: Never Used  . Alcohol use No      Allergies   Hydroxyzine pamoate   Review of Systems Review of Systems  HENT: Positive for dental problem. Negative for facial swelling.   All other systems reviewed and are negative.    Physical Exam Triage Vital Signs ED Triage Vitals  Enc Vitals Group     BP 07/05/16 1506 133/69     Pulse Rate 07/05/16 1506 92     Resp 07/05/16 1506 18     Temp 07/05/16 1506 98.1 F (36.7 C)     Temp Source 07/05/16 1506 Oral     SpO2 07/05/16 1506 100 %     Weight --      Height --      Head Circumference --      Peak Flow --      Pain Score 07/05/16 1518 10     Pain Loc --      Pain Edu? --      Excl. in [place]? --    No data found.   Updated Vital Signs BP 133/69 (BP Location: Left Arm)   Pulse 92   Temp 98.1 F (36.7 C) (Oral)   Resp 18   SpO2 100%   Visual Acuity Right Eye Distance:   Left Eye Distance:   Bilateral Distance:    Right Eye Near:   Left Eye Near:    Bilateral Near:     Physical Exam  Constitutional: She is oriented to person, place, and time. She appears well-developed and well-nourished.  HENT:  Mouth/Throat: Oropharynx is clear and moist.    Lymphadenopathy:    She has no cervical adenopathy.  Neurological: She is alert and oriented to person, place, and time.  Vitals reviewed.    UC Treatments / Results  Labs (all labs ordered are listed, but only abnormal results are displayed) Labs Reviewed - No data to display  EKG  EKG Interpretation None       Radiology No results found.  Procedures Procedures (including critical care time)  Medications Ordered in UC Medications - No data to display   Initial Impression / Assessment and Plan / UC Course  I have reviewed the triage vital signs and the nursing notes.  Pertinent labs & imaging results that were available during my care of the patient were reviewed by me and considered in my medical decision making (see chart for details).  Clinical Course      Final Clinical  Impressions(s) / UC Diagnoses   Final diagnoses:  None    New Prescriptions New Prescriptions   No medications on file     Billy Fischer, MD 07/05/16 1911

## 2016-07-05 NOTE — ED Triage Notes (Addendum)
Pt here for left lower dental pain, swelling.

## 2016-07-09 ENCOUNTER — Other Ambulatory Visit: Payer: Self-pay | Admitting: Family Medicine

## 2016-08-14 ENCOUNTER — Telehealth: Payer: Self-pay | Admitting: Family Medicine

## 2016-08-14 NOTE — Telephone Encounter (Signed)
Pataday no longer covered under [employer] - ok to change to Patanol?

## 2016-08-15 MED ORDER — GABAPENTIN 300 MG PO CAPS: 300.0000 mg | ORAL_CAPSULE | Freq: Three times a day (TID) | ORAL | 5 refills | Status: DC

## 2016-08-15 MED ORDER — OLOPATADINE HCL 0.1 % OP SOLN: 1.0000 [drp] | Freq: Two times a day (BID) | OPHTHALMIC | 12 refills | Status: DC

## 2016-08-15 NOTE — Telephone Encounter (Signed)
Called and spoke to pt and aware of the change in medication also sent over refill on Gabapentin.

## 2016-08-15 NOTE — Telephone Encounter (Signed)
Medication changed and sent to pharmacy. Tried to call pt to inform of change - no answer and no vm will try again at a later time.

## 2016-08-15 NOTE — Telephone Encounter (Signed)
Yes. Approved. 

## 2016-10-30 ENCOUNTER — Other Ambulatory Visit: Payer: Self-pay | Admitting: Family Medicine

## 2016-12-13 ENCOUNTER — Encounter (HOSPITAL_COMMUNITY): Payer: Self-pay | Admitting: Emergency Medicine

## 2016-12-13 ENCOUNTER — Ambulatory Visit (HOSPITAL_COMMUNITY)
Admission: EM | Admit: 2016-12-13 | Discharge: 2016-12-13 | Disposition: A | Discharge status: Home / Self Care | Payer: Medicaid Other | Attending: Nurse Practitioner | Admitting: Nurse Practitioner

## 2016-12-13 DIAGNOSIS — R350 Frequency of micturition: Secondary | ICD-10-CM | POA: Insufficient documentation

## 2016-12-13 DIAGNOSIS — Z823 Family history of stroke: Secondary | ICD-10-CM | POA: Diagnosis not present

## 2016-12-13 DIAGNOSIS — N12 Tubulo-interstitial nephritis, not specified as acute or chronic: Secondary | ICD-10-CM | POA: Insufficient documentation

## 2016-12-13 DIAGNOSIS — Z888 Allergy status to other drugs, medicaments and biological substances status: Secondary | ICD-10-CM | POA: Insufficient documentation

## 2016-12-13 DIAGNOSIS — Z79899 Other long term (current) drug therapy: Secondary | ICD-10-CM | POA: Diagnosis not present

## 2016-12-13 DIAGNOSIS — Z9049 Acquired absence of other specified parts of digestive tract: Secondary | ICD-10-CM | POA: Diagnosis not present

## 2016-12-13 DIAGNOSIS — N898 Other specified noninflammatory disorders of vagina: Secondary | ICD-10-CM | POA: Insufficient documentation

## 2016-12-13 DIAGNOSIS — N390 Urinary tract infection, site not specified: Secondary | ICD-10-CM | POA: Insufficient documentation

## 2016-12-13 DIAGNOSIS — Z833 Family history of diabetes mellitus: Secondary | ICD-10-CM | POA: Insufficient documentation

## 2016-12-13 DIAGNOSIS — R300 Dysuria: Secondary | ICD-10-CM | POA: Insufficient documentation

## 2016-12-13 DIAGNOSIS — Z9889 Other specified postprocedural states: Secondary | ICD-10-CM | POA: Diagnosis not present

## 2016-12-13 LAB — POCT URINALYSIS DIP (DEVICE)
BILIRUBIN URINE: NEGATIVE
GLUCOSE, UA: NEGATIVE mg/dL
KETONES UR: NEGATIVE mg/dL
Nitrite: POSITIVE — AB
Protein, ur: >=300 mg/dL — AB
SPECIFIC GRAVITY, URINE: 1.02 (ref 1.005–1.030)
UROBILINOGEN UA: 0.2 mg/dL (ref 0.0–1.0)
pH: 5.5 (ref 5.0–8.0)

## 2016-12-13 MED ORDER — PHENAZOPYRIDINE HCL 200 MG PO TABS: 200.0000 mg | ORAL_TABLET | Freq: Three times a day (TID) | ORAL | 0 refills | Status: DC

## 2016-12-13 MED ORDER — FLUCONAZOLE 150 MG PO TABS: 150.0000 mg | ORAL_TABLET | Freq: Every day | ORAL | 0 refills | Status: DC

## 2016-12-13 MED ORDER — NITROFURANTOIN MONOHYD MACRO 100 MG PO CAPS: 100.0000 mg | ORAL_CAPSULE | Freq: Two times a day (BID) | ORAL | 0 refills | Status: DC

## 2016-12-13 MED ORDER — CIPROFLOXACIN HCL 500 MG PO TABS: 500.0000 mg | ORAL_TABLET | Freq: Two times a day (BID) | ORAL | 0 refills | Status: DC

## 2016-12-13 NOTE — ED Provider Notes (Signed)
CSN: [id number]     Arrival date & time 12/13/16  1201 History   First MD Initiated Contact with Patient 12/13/16 1219     Chief Complaint  Patient presents with  . Urinary Tract Infection   (Consider location/radiation/quality/duration/timing/severity/associated sxs/prior Treatment) Patient is a 56 y.o. Female, well-appearing, presents today with concern for UTI for 1 week. She reports dysuria, urinary frequency, unable to fully empty her bladder, nausea, abdominal discomfort, back pain, and highest temp of 102.0 at home. She also endorses vaginal discharge in moderate amt that is white and odorous accompany by vaginal itchiness. She reports frequent history of UTI. She is afebrile currently. She is not sexually active.         Past Medical History:  Diagnosis Date  . Abdominal pain   . Allergy   . GERD (gastroesophageal reflux disease)   . Glaucoma   . Hemorrhoids   . Hiatal hernia   . IBS (irritable bowel syndrome)   . N&V (nausea and vomiting)   . Osteoporosis   . Rectal bleeding   . Rectal ulcer   . Sinus problem   . TMJ (dislocation of temporomandibular joint)   . Vitamin D deficiency    Past Surgical History:  Procedure Laterality Date  . ANAL FISSURE REPAIR    . CHOLECYSTECTOMY  1984  . COLONOSCOPY W/ POLYPECTOMY     colon ulcers  . EYE SURGERY    . HEMI-MICRODISCECTOMY LUMBAR LAMINECTOMY LEVEL 1 Left 05/26/2013   Procedure: HEMI-MICRODISCECTOMY LUMBAR LAMINECTOMY L4 - L5 ON THE LEFT LEVEL 1;  Surgeon: Tobi Bastos, MD;  Location: [facility];  Service: Orthopedics;  Laterality: Left;  . HEMORROIDECTOMY     Family History  Problem Relation Age of Onset  . Diabetes Mother   . Stroke Father    Social History  Substance Use Topics  . Smoking status: Never Smoker  . Smokeless tobacco: Never Used  . Alcohol use No   OB History    No data available     Review of Systems  Constitutional:       As stated in the HPI    Allergies  Hydroxyzine  pamoate  Home Medications   Prior to Admission medications   Medication Sig Start Date End Date Taking? Authorizing Provider  alendronate (FOSAMAX) 70 MG tablet Take 1 tablet (70 mg total) by mouth every 7 (seven) days. Take with a full glass of water on an empty stomach. 12/14/15   Susy Frizzle, MD  atorvastatin (LIPITOR) 20 MG tablet Take 1 tablet (20 mg total) by mouth daily. 12/13/14   Susy Frizzle, MD  ciprofloxacin (CIPRO) 500 MG tablet Take 1 tablet (500 mg total) by mouth 2 (two) times daily. 12/13/16 12/18/16  Barry Dienes, NP  clindamycin (CLEOCIN) 150 MG capsule Take 1 capsule (150 mg total) by mouth 4 (four) times daily. 07/05/16   Billy Fischer, MD  cyclobenzaprine (FLEXERIL) 10 MG tablet Take 10 mg by mouth at bedtime.    Historical Provider, MD  dexlansoprazole (DEXILANT) 60 MG capsule Take 60 mg by mouth daily.    Historical Provider, MD  diclofenac (CATAFLAM) 50 MG tablet Take 1 tablet (50 mg total) by mouth 3 (three) times daily. For dental pain 07/05/16   Billy Fischer, MD  fexofenadine (ALLEGRA) 180 MG tablet TAKE 1 TABLET BY MOUTH EVERY DAY 10/30/16   Susy Frizzle, MD  fluconazole (DIFLUCAN) 150 MG tablet Take 1 tablet (150 mg total) by mouth daily. 12/13/16  Barry Dienes, NP  fluticasone (FLONASE) 50 MCG/ACT nasal spray PLACE 2 SPRAYS INTO THE NOSE DAILY. 03/24/16   Susy Frizzle, MD  gabapentin (NEURONTIN) 300 MG capsule Take 1 capsule (300 mg total) by mouth 3 (three) times daily. 08/15/16   Susy Frizzle, MD  losartan (COZAAR) 100 MG tablet TAKE 1 TABLET EVERY DAY 03/21/16   Susy Frizzle, MD  olopatadine (PATANOL) 0.1 % ophthalmic solution Place 1 drop into both eyes 2 (two) times daily. 08/15/16   Susy Frizzle, MD  PATADAY 0.2 % SOLN PLACE 1 DROP INTO BOTH EYES EVERY DAY 07/09/16   Susy Frizzle, MD  phenazopyridine (PYRIDIUM) 200 MG tablet Take 1 tablet (200 mg total) by mouth 3 (three) times daily. 12/13/16   Barry Dienes, NP   Meds Ordered and  Administered this Visit  Medications - No data to display  BP (!) 160/91 (BP Location: Right Arm)   Pulse 81   Temp 98.8 F (37.1 C) (Oral)   Resp 16   Ht 5\' 1"  (1.549 m)   Wt 140 lb (63.5 kg)   SpO2 100%   BMI 26.45 kg/m  No data found.   Physical Exam  Constitutional: She is oriented to person, place, and time. She appears well-developed and well-nourished.  Cardiovascular: Normal rate, regular rhythm and normal heart sounds.   Pulmonary/Chest: Effort normal and breath sounds normal.  Abdominal: Soft. Bowel sounds are normal.  Superpubic tenderness present.   Genitourinary:  Genitourinary Comments: Positive bilateral CVA tednerness  External labia majora and minora symmetrical with no lesions. Vaginal canal pink, moist with no lesion. There is small amount of clear vaginal discharge noted. -cmt, -adnexal mass/tenderness.    Neurological: She is alert and oriented to person, place, and time.  Skin: Skin is warm and dry.  Nursing note and vitals reviewed.   Urgent Care Course     Procedures (including critical care time)  Labs Review Labs Reviewed  POCT URINALYSIS DIP (DEVICE) - Abnormal; Notable for the following:       Result Value   Hgb urine dipstick LARGE (*)    Protein, ur >=300 (*)    Nitrite POSITIVE (*)    Leukocytes, UA TRACE (*)    All other components within normal limits  CERVICOVAGINAL ANCILLARY ONLY    Imaging Review No results found.  MDM   1. Pyelonephritis    UTI is indicative of UTI. Will tx with CIPRO 500 mg BID x 5 days and pyridium. RX for diflucan given to prevent yeast infection. Urine culture is pending.   Wet prep is pending for the vaginal discharge, however doubt yeast or BV.   Patient has appt next Tuesday with her PCP; informed to f/u with her PCP as scheduled and mention about the high blood pressure to his PCP next Tuesday.   Patient denies any questions. Discharge paperwork given.        Barry Dienes, NP 12/13/16  1248

## 2016-12-13 NOTE — Discharge Instructions (Signed)
Start taking the antibiotic twice a day for 5 days as prescribed.  Also start taking the Pyridium to help with the burning sensation; take this 3 times a day for 2 days  Please see her doctor next Tuesday, please inform your doctor that your blood pressure is elevated today.

## 2016-12-13 NOTE — ED Triage Notes (Signed)
PT reports flank pain and burning with urination for 1 week.

## 2016-12-15 ENCOUNTER — Telehealth (HOSPITAL_COMMUNITY): Payer: Self-pay | Admitting: Internal Medicine

## 2016-12-15 LAB — URINE CULTURE: Culture: >=100000 — AB

## 2016-12-15 LAB — CERVICOVAGINAL ANCILLARY ONLY: Wet Prep (BD Affirm): NEGATIVE

## 2016-12-15 MED ORDER — SULFAMETHOXAZOLE-TRIMETHOPRIM 800-160 MG PO TABS: 1.0000 | ORAL_TABLET | Freq: Two times a day (BID) | ORAL | 0 refills | Status: DC

## 2016-12-15 NOTE — Telephone Encounter (Signed)
Clinical staff, please let patient know that urine culture was positive for E coli germ, sensitive to trimethoprim/sulfa but resistant to cipro rx given at [facility] visit 3/31.  Rx trimethoprim/sulfa sent to pharmacy of record, [facility] on [address] at [facility].  Start trimethoprim/sulfa and stop cipro.  Recheck or followup with PCP for further evaluation if symptoms are not improving.  LM

## 2016-12-16 ENCOUNTER — Ambulatory Visit (INDEPENDENT_AMBULATORY_CARE_PROVIDER_SITE_OTHER): Payer: Medicaid Other | Admitting: Family Medicine

## 2016-12-16 ENCOUNTER — Encounter: Payer: Self-pay | Admitting: Family Medicine

## 2016-12-16 VITALS — BP 150/90 | HR 74 | Temp 98.1°F | Resp 16 | Ht 62.5 in | Wt 145.0 lb

## 2016-12-16 DIAGNOSIS — Z0000 Encounter for general adult medical examination without abnormal findings: Secondary | ICD-10-CM | POA: Diagnosis not present

## 2016-12-16 DIAGNOSIS — M810 Age-related osteoporosis without current pathological fracture: Secondary | ICD-10-CM | POA: Diagnosis not present

## 2016-12-16 LAB — COMPLETE METABOLIC PANEL WITH GFR
ALBUMIN: 4.2 g/dL (ref 3.6–5.1)
ALK PHOS: 84 U/L (ref 33–130)
ALT: 7 U/L (ref 6–29)
AST: 14 U/L (ref 10–35)
BUN: 10 mg/dL (ref 7–25)
CALCIUM: 9.7 mg/dL (ref 8.6–10.4)
CHLORIDE: 104 mmol/L (ref 98–110)
CO2: 22 mmol/L (ref 20–31)
CREATININE: 0.8 mg/dL (ref 0.50–1.05)
GFR, Est Non African American: 83 mL/min (ref >=60)
Glucose, Bld: 85 mg/dL (ref 70–99)
POTASSIUM: 3.7 mmol/L (ref 3.5–5.3)
Sodium: 138 mmol/L (ref 135–146)
Total Bilirubin: 0.7 mg/dL (ref 0.2–1.2)
Total Protein: 7.4 g/dL (ref 6.1–8.1)

## 2016-12-16 LAB — CBC WITH DIFFERENTIAL/PLATELET
BASOS PCT: 0 %
Basophils Absolute: 0 cells/uL (ref 0–200)
EOS ABS: 48 {cells}/uL (ref 15–500)
Eosinophils Relative: 1 %
HEMATOCRIT: 40.9 % (ref 35.0–45.0)
HEMOGLOBIN: 13.4 g/dL (ref 12.0–15.0)
LYMPHS ABS: 1728 {cells}/uL (ref 850–3900)
Lymphocytes Relative: 36 %
MCH: 28 pg (ref 27.0–33.0)
MCHC: 32.8 g/dL (ref 32.0–36.0)
MCV: 85.6 fL (ref 80.0–100.0)
MONO ABS: 288 {cells}/uL (ref 200–950)
MPV: 9.9 fL (ref 7.5–12.5)
Monocytes Relative: 6 %
NEUTROS PCT: 57 %
Neutro Abs: 2736 cells/uL (ref 1500–7800)
Platelets: 198 10*3/uL (ref 140–400)
RBC: 4.78 MIL/uL (ref 3.80–5.10)
RDW: 14 % (ref 11.0–15.0)
WBC: 4.8 10*3/uL (ref 3.8–10.8)

## 2016-12-16 LAB — LIPID PANEL
CHOLESTEROL: 222 mg/dL — AB (ref <200)
HDL: 68 mg/dL (ref >50)
LDL Cholesterol: 142 mg/dL — ABNORMAL HIGH (ref <100)
TRIGLYCERIDES: 61 mg/dL (ref <150)
Total CHOL/HDL Ratio: 3.3 Ratio (ref <5.0)
VLDL: 12 mg/dL (ref <30)

## 2016-12-16 NOTE — Progress Notes (Signed)
Subjective:    Patient ID: Heather Gillespie, female    DOB: Nov 09, 1960, 56 y.o.   MRN: [id number]  HPI  Patient is here today for complete physical exam.Her last mammogram was in October and was normal. She is due for another mammogram this October. Colonoscopy was 1 year ago and is not due again for 9 years. She had a Pap smear last year that was normal and therefore she is not due again for 2 more years. Immunizations are up-to-date. She refuses a flu shot because her mother had Guyon Barr. Her repeat bone density test. She is on Fosamax for history of osteoporosis. Over the weekend went to an urgent care and was diagnosed with urinary tract infection was given Cipro. Cultures revealed Escherichia coli resistant to Cipro. Urgentcare has called out Bactrim but the patient hasn't picked it up yet.  Past Medical History:  Diagnosis Date  . Abdominal pain   . Allergy   . GERD (gastroesophageal reflux disease)   . Glaucoma   . Hemorrhoids   . Hiatal hernia   . IBS (irritable bowel syndrome)   . N&V (nausea and vomiting)   . Osteoporosis   . Rectal bleeding   . Rectal ulcer   . Sinus problem   . TMJ (dislocation of temporomandibular joint)   . Vitamin D deficiency    Past Surgical History:  Procedure Laterality Date  . ANAL FISSURE REPAIR    . CHOLECYSTECTOMY  1984  . COLONOSCOPY W/ POLYPECTOMY     colon ulcers  . EYE SURGERY    . HEMI-MICRODISCECTOMY LUMBAR LAMINECTOMY LEVEL 1 Left 05/26/2013   Procedure: HEMI-MICRODISCECTOMY LUMBAR LAMINECTOMY L4 - L5 ON THE LEFT LEVEL 1;  Surgeon: Tobi Bastos, MD;  Location: [facility];  Service: Orthopedics;  Laterality: Left;  . HEMORROIDECTOMY     Current Outpatient Prescriptions on File Prior to Visit  Medication Sig Dispense Refill  . alendronate (FOSAMAX) 70 MG tablet Take 1 tablet (70 mg total) by mouth every 7 (seven) days. Take with a full glass of water on an empty stomach. 4 tablet 11  . cyclobenzaprine (FLEXERIL) 10 MG tablet  Take 10 mg by mouth at bedtime.    Marland Kitchen dexlansoprazole (DEXILANT) 60 MG capsule Take 60 mg by mouth daily.    . fexofenadine (ALLEGRA) 180 MG tablet TAKE 1 TABLET BY MOUTH EVERY DAY 30 tablet 11  . fluticasone (FLONASE) 50 MCG/ACT nasal spray PLACE 2 SPRAYS INTO THE NOSE DAILY. 16 g 11  . gabapentin (NEURONTIN) 300 MG capsule Take 1 capsule (300 mg total) by mouth 3 (three) times daily. 90 capsule 5  . losartan (COZAAR) 100 MG tablet TAKE 1 TABLET EVERY DAY (Patient taking differently: TAKE 1/2 TABLET EVERY DAY) 90 tablet 3  . olopatadine (PATANOL) 0.1 % ophthalmic solution Place 1 drop into both eyes 2 (two) times daily. 5 mL 12  . PATADAY 0.2 % SOLN PLACE 1 DROP INTO BOTH EYES EVERY DAY 2.5 mL 6  . phenazopyridine (PYRIDIUM) 200 MG tablet Take 1 tablet (200 mg total) by mouth 3 (three) times daily. 6 tablet 0  . atorvastatin (LIPITOR) 20 MG tablet Take 1 tablet (20 mg total) by mouth daily. 30 tablet 3   No current facility-administered medications on file prior to visit.    Allergies  Allergen Reactions  . Hydroxyzine Pamoate Rash   Social History   Social History  . Marital status: Single    Spouse name: N/A  . Number of children: N/A  .  Years of education: N/A   Occupational History  . Not on file.   Social History Main Topics  . Smoking status: Never Smoker  . Smokeless tobacco: Never Used  . Alcohol use No  . Drug use: No  . Sexual activity: Not on file   Other Topics Concern  . Not on file   Social History Narrative  . No narrative on file   Family History  Problem Relation Age of Onset  . Diabetes Mother   . Stroke Father       Review of Systems  All other systems reviewed and are negative.      Objective:   Physical Exam  Constitutional: She is oriented to person, place, and time. She appears well-developed and well-nourished. No distress.  HENT:  Head: Normocephalic and atraumatic.  Right Ear: External ear normal.  Left Ear: External ear normal.    Nose: Nose normal.  Mouth/Throat: Oropharynx is clear and moist. No oropharyngeal exudate.  Eyes: Conjunctivae and EOM are normal. Pupils are equal, round, and reactive to light. Right eye exhibits no discharge. Left eye exhibits no discharge. No scleral icterus.  Neck: Normal range of motion. Neck supple. No JVD present. No tracheal deviation present. No thyromegaly present.  Cardiovascular: Normal rate, regular rhythm, normal heart sounds and intact distal pulses.  Exam reveals no gallop and no friction rub.   No murmur heard. Pulmonary/Chest: Effort normal and breath sounds normal. No stridor. No respiratory distress. She has no wheezes. She has no rales. She exhibits no tenderness.  Abdominal: Soft. Bowel sounds are normal. She exhibits no distension and no mass. There is no tenderness. There is no rebound and no guarding.  Musculoskeletal: Normal range of motion. She exhibits no edema or tenderness.  Lymphadenopathy:    She has no cervical adenopathy.  Neurological: She is alert and oriented to person, place, and time. She has normal reflexes. No cranial nerve deficit. She exhibits normal muscle tone. Coordination normal.  Skin: Skin is warm. No rash noted. She is not diaphoretic. No erythema. No pallor.  Psychiatric: She has a normal mood and affect. Her behavior is normal. Judgment and thought content normal.  Vitals reviewed.         Assessment & Plan:  General medical exam - Plan: CBC with Differential/Platelet, COMPLETE METABOLIC PANEL WITH GFR, Lipid panel  Osteoporosis, unspecified osteoporosis type, unspecified pathological fracture presence - Plan: DG Bone Density  Patient's physical exam today is relatively normal. Her cancer screening is up-to-date. I did discuss the shingles vaccine with her. Her blood pressures elevated Silastic patient to increase her losartan 100 mg a day. I will check a CBC, CMP, fasting lipid panel. I'll schedule the patient for a bone density  test.

## 2016-12-19 ENCOUNTER — Telehealth: Payer: Self-pay | Admitting: Family Medicine

## 2016-12-19 MED ORDER — ATORVASTATIN CALCIUM 20 MG PO TABS: 20.0000 mg | ORAL_TABLET | Freq: Every evening | ORAL | 0 refills | Status: DC

## 2016-12-19 NOTE — Telephone Encounter (Signed)
Rx to pharmacy.  Pt made aware.  Told to call if she feels she is having problem with new med

## 2016-12-19 NOTE — Telephone Encounter (Signed)
-----   Message from Susy Frizzle, MD sent at 12/18/2016  7:07 AM EDT ----- Cholesterol remains elevate despite lipitor 20 poqday.  Increase to 40 mg poqday

## 2016-12-19 NOTE — Telephone Encounter (Signed)
Start lipitor 20 poqd

## 2016-12-19 NOTE — Telephone Encounter (Signed)
Called pt about lab results.  She is very confused.  Med list all wrong. Spent 20-25 min on phone with her trying to figure out what she is and is not taking.  Kept trying to bring up what her parents are taking.  Finally determined she is not on on sort of statin and say has never been that she can remember.  Kept asking about HCTZ which again she is not on???  What do you want to do about her statin??

## 2017-01-01 ENCOUNTER — Other Ambulatory Visit: Payer: Self-pay | Admitting: Family Medicine

## 2017-01-01 ENCOUNTER — Encounter: Payer: Self-pay | Admitting: Family Medicine

## 2017-01-01 DIAGNOSIS — M8580 Other specified disorders of bone density and structure, unspecified site: Secondary | ICD-10-CM

## 2017-03-08 ENCOUNTER — Other Ambulatory Visit: Payer: Self-pay | Admitting: Family Medicine

## 2017-03-28 ENCOUNTER — Encounter (HOSPITAL_COMMUNITY): Payer: Self-pay | Admitting: Physician Assistant

## 2017-03-28 ENCOUNTER — Ambulatory Visit (HOSPITAL_COMMUNITY)
Admission: EM | Admit: 2017-03-28 | Discharge: 2017-03-28 | Disposition: A | Discharge status: Home / Self Care | Payer: Medicaid Other | Attending: Internal Medicine | Admitting: Internal Medicine

## 2017-03-28 DIAGNOSIS — N3090 Cystitis, unspecified without hematuria: Secondary | ICD-10-CM

## 2017-03-28 DIAGNOSIS — R300 Dysuria: Secondary | ICD-10-CM

## 2017-03-28 LAB — POCT URINALYSIS DIP (DEVICE)
BILIRUBIN URINE: NEGATIVE
GLUCOSE, UA: NEGATIVE mg/dL
Ketones, ur: NEGATIVE mg/dL
Nitrite: NEGATIVE
Protein, ur: 100 mg/dL — AB
SPECIFIC GRAVITY, URINE: 1.025 (ref 1.005–1.030)
UROBILINOGEN UA: 0.2 mg/dL (ref 0.0–1.0)
pH: 5 (ref 5.0–8.0)

## 2017-03-28 MED ORDER — SULFAMETHOXAZOLE-TRIMETHOPRIM 800-160 MG PO TABS: 1.0000 | ORAL_TABLET | Freq: Two times a day (BID) | ORAL | 0 refills | Status: AC

## 2017-03-28 MED ORDER — ONDANSETRON HCL 4 MG PO TABS: 4.0000 mg | ORAL_TABLET | Freq: Four times a day (QID) | ORAL | 0 refills | Status: DC

## 2017-03-28 NOTE — Discharge Instructions (Signed)
Your urine dipstick was positive for a urinary tract infection. Start Bactrim twice a day for 3 days. Start Zofran every 6 hours as needed for nausea/vomiting. Keep hydrated. Monitor for worsening of symptoms, fever, flank pain, to follow up for reevaluation.

## 2017-03-28 NOTE — ED Triage Notes (Signed)
Patient presents to [facility] today with burning with urination, urinary urgency and frequency, lower back and abdominal pain, hematuria, x1 week. Has tried the OTC AZO and drinking plenty of water with no relief.

## 2017-03-28 NOTE — ED Provider Notes (Signed)
CSN: [id number]     Arrival date & time 03/28/17  1201 History   None    Chief Complaint  Patient presents with  . Dysuria   (Consider location/radiation/quality/duration/timing/severity/associated sxs/prior Treatment) 56 year old female comes in with 1 week history of urinary frequency, dysuria, hesitancy, hematuria. She states she has had a fever with Tmax of 101. Has not taken anything for it. She states when it first started, she tried over-the-counter UTI testing strips. Denies Azo use. She has been having some suprapubic abdominal pain, some lower back pain. She has had nausea, preventing her from drinking water or eating. Denies vaginal discharge, pain/itching, spotting. Had bowel movement this morning, which was softer than usual. Denies constipation. Denies history of kidney stones.       Past Medical History:  Diagnosis Date  . Abdominal pain   . Allergy   . GERD (gastroesophageal reflux disease)   . Glaucoma   . Hemorrhoids   . Hiatal hernia   . IBS (irritable bowel syndrome)   . N&V (nausea and vomiting)   . Osteoporosis   . Rectal bleeding   . Rectal ulcer   . Sinus problem   . TMJ (dislocation of temporomandibular joint)   . Vitamin D deficiency    Past Surgical History:  Procedure Laterality Date  . ANAL FISSURE REPAIR    . CHOLECYSTECTOMY  1984  . COLONOSCOPY W/ POLYPECTOMY     colon ulcers  . EYE SURGERY    . HEMI-MICRODISCECTOMY LUMBAR LAMINECTOMY LEVEL 1 Left 05/26/2013   Procedure: HEMI-MICRODISCECTOMY LUMBAR LAMINECTOMY L4 - L5 ON THE LEFT LEVEL 1;  Surgeon: Tobi Bastos, MD;  Location: [facility];  Service: Orthopedics;  Laterality: Left;  . HEMORROIDECTOMY     Family History  Problem Relation Age of Onset  . Diabetes Mother   . Stroke Father    Social History  Substance Use Topics  . Smoking status: Never Smoker  . Smokeless tobacco: Never Used  . Alcohol use No   OB History    No data available     Review of Systems  Constitutional:  Positive for fever. Negative for chills and diaphoresis.  Respiratory: Negative for shortness of breath and wheezing.   Cardiovascular: Negative for chest pain and palpitations.  Gastrointestinal: Positive for abdominal pain, diarrhea, nausea and vomiting. Negative for blood in stool and constipation.  Genitourinary: Positive for decreased urine volume, difficulty urinating, dysuria, frequency, hematuria, pelvic pain and urgency. Negative for flank pain, menstrual problem, vaginal bleeding, vaginal discharge and vaginal pain.  Skin: Negative for rash.    Allergies  Hydroxyzine pamoate  Home Medications   Prior to Admission medications   Medication Sig Start Date End Date Taking? Authorizing Provider  alendronate (FOSAMAX) 70 MG tablet Take 1 tablet (70 mg total) by mouth every 7 (seven) days. Take with a full glass of water on an empty stomach. 12/14/15  Yes Susy Frizzle, MD  atorvastatin (LIPITOR) 20 MG tablet TAKE 1 TABLET BY MOUTH EVERY EVENING. 03/09/17  Yes Susy Frizzle, MD  cyclobenzaprine (FLEXERIL) 10 MG tablet Take 10 mg by mouth at bedtime.   Yes [provider]  dexlansoprazole (DEXILANT) 60 MG capsule Take 60 mg by mouth daily.   Yes [provider]  Ergocalciferol (VITAMIN D2) 2000 units TABS Take 1 tablet by mouth daily.   Yes [provider]  fexofenadine (ALLEGRA) 180 MG tablet TAKE 1 TABLET BY MOUTH EVERY DAY 10/30/16  Yes Susy Frizzle, MD  fluticasone Asencion Islam)  50 MCG/ACT nasal spray PLACE 2 SPRAYS INTO THE NOSE DAILY. 03/24/16  Yes Susy Frizzle, MD  gabapentin (NEURONTIN) 300 MG capsule Take 1 capsule (300 mg total) by mouth 3 (three) times daily. 08/15/16  Yes Susy Frizzle, MD  losartan (COZAAR) 100 MG tablet TAKE 1 TABLET EVERY DAY Patient taking differently: No sig reported 03/21/16  Yes Susy Frizzle, MD  magic mouthwash SOLN Take 5 mLs by mouth.   Yes [provider]  olopatadine (PATANOL) 0.1 % ophthalmic  solution Place 1 drop into both eyes 2 (two) times daily. 08/15/16  Yes Susy Frizzle, MD  ondansetron (ZOFRAN) 4 MG tablet Take 1 tablet (4 mg total) by mouth every 6 (six) hours. 03/28/17   Tasia Catchings, Jonie Burdell V, PA-C  sulfamethoxazole-trimethoprim (BACTRIM DS,SEPTRA DS) 800-160 MG tablet Take 1 tablet by mouth 2 (two) times daily. 03/28/17 03/31/17  Ok Edwards, PA-C   Meds Ordered and Administered this Visit  Medications - No data to display  BP 122/69 (BP Location: Left Arm)   Pulse 90   Temp 99.2 F (37.3 C) (Oral)   Resp 16   SpO2 99%  No data found.   Physical Exam  Constitutional: She is oriented to person, place, and time. She appears well-developed and well-nourished. No distress.  HENT:  Head: Normocephalic and atraumatic.  Eyes: Pupils are equal, round, and reactive to light. Conjunctivae are normal.  Cardiovascular: Normal rate, regular rhythm and normal heart sounds.  Exam reveals no gallop and no friction rub.   No murmur heard. Pulmonary/Chest: Effort normal and breath sounds normal. She has no wheezes. She has no rales.  Abdominal: Soft. Bowel sounds are normal. She exhibits no mass. There is tenderness (suprapubic). There is no rebound, no guarding and no CVA tenderness.  Neurological: She is alert and oriented to person, place, and time.  Skin: Skin is warm and dry.  Psychiatric: She has a normal mood and affect. Her behavior is normal. Judgment normal.    Urgent Care Course     Procedures (including critical care time)  Labs Review Labs Reviewed  POCT URINALYSIS DIP (DEVICE) - Abnormal; Notable for the following:       Result Value   Hgb urine dipstick LARGE (*)    Protein, ur 100 (*)    Leukocytes, UA SMALL (*)    All other components within normal limits    Imaging Review No results found.       MDM   1. Cystitis    Discussed lab results with patient. Urine dipstick with blood and leukocytes, consistent with cystitis. Patient with fever at home,  afebrile today without tylenol use. Start Bactrim twice a day for 3 days. Zofran prescription given for nausea/vomiting. Side effects of medicines discussed with patient. If notice any allergies to medicine, to stop medicines and contact PCP. Patient to push fluids. Patient to monitor for worsening of symptoms, fever, flank pain, to seek reevaluation.    Ok Edwards, PA-C 03/28/17 1245

## 2017-04-04 ENCOUNTER — Other Ambulatory Visit: Payer: Self-pay | Admitting: Family Medicine

## 2017-04-06 ENCOUNTER — Other Ambulatory Visit: Payer: Self-pay | Admitting: Family Medicine

## 2017-04-21 ENCOUNTER — Encounter: Payer: Self-pay | Admitting: Family Medicine

## 2017-04-21 ENCOUNTER — Ambulatory Visit (INDEPENDENT_AMBULATORY_CARE_PROVIDER_SITE_OTHER): Payer: Medicaid Other | Admitting: Family Medicine

## 2017-04-21 VITALS — BP 138/78 | HR 82 | Temp 98.4°F | Resp 14 | Ht 62.5 in | Wt 147.0 lb

## 2017-04-21 DIAGNOSIS — R319 Hematuria, unspecified: Secondary | ICD-10-CM | POA: Diagnosis not present

## 2017-04-21 DIAGNOSIS — N390 Urinary tract infection, site not specified: Secondary | ICD-10-CM

## 2017-04-21 LAB — URINALYSIS, ROUTINE W REFLEX MICROSCOPIC
Bilirubin Urine: NEGATIVE
GLUCOSE, UA: NEGATIVE
Ketones, ur: NEGATIVE
LEUKOCYTES UA: NEGATIVE
Nitrite: POSITIVE — AB
PH: 6 (ref 5.0–8.0)

## 2017-04-21 LAB — URINALYSIS, MICROSCOPIC ONLY
CASTS: NONE SEEN [LPF]
Crystals: NONE SEEN [HPF]
YEAST: NONE SEEN [HPF]

## 2017-04-21 MED ORDER — FLUCONAZOLE 150 MG PO TABS: 150.0000 mg | ORAL_TABLET | Freq: Once | ORAL | 1 refills | Status: AC

## 2017-04-21 MED ORDER — NITROFURANTOIN MONOHYD MACRO 100 MG PO CAPS: 100.0000 mg | ORAL_CAPSULE | Freq: Two times a day (BID) | ORAL | 0 refills | Status: DC

## 2017-04-21 NOTE — Patient Instructions (Addendum)
Take antibiotics  Use diflucan  Take AZO for 3 days  F/U as needed

## 2017-04-21 NOTE — Assessment & Plan Note (Addendum)
Start macrobid based on sensitivites, send for culture, AZO, diflucan. Will plan to have her come back pending culture results to ensure clear, if not or recurrence send to urology  Also given number for Enviromental services to call for her well

## 2017-04-21 NOTE — Progress Notes (Signed)
   Subjective:    Patient ID: Heather Gillespie, female    DOB: 1960/11/27, 56 y.o.   MRN: [id number]  Patient presents for Dysuria (x3 weeks- intermittent for the past 3 weeks, but more pronounced in the last week- burning with urination, frequency and urgency)  Patient here dysuria or pressure urgency or frequency asked few weeks. She has had mild fever around 100, chills, nausea   She does have history of urinary tract infection. Medications reviewed Her last urine culture was in March of this year which did show Escherichia coli which was resistant to ciprofloxacin, cephalosporins she was treated with Bactrim at that time. She had repeat UA in July 14 seen at [facility] given Bactrim, but Bactrim upset stomach, and midl rash if she took 2, but states she did complete the course of antibiotics   She believes that her well WATER may be contaminated as both she and her mother have been getting recurrent infections.    Review Of Systems:  GEN- denies fatigue, fever, weight loss,weakness, recent illness HEENT- denies eye drainage, change in vision, nasal discharge, CVS- denies chest pain, palpitations RESP- denies SOB, cough, wheeze ABD- denies N/V, change in stools, abd pain GU-+ dysuria, denies  +hematuria, dribbling, incontinence MSK- denies joint pain, muscle aches, injury Neuro- denies headache, dizziness, syncope, seizure activity       Objective:    BP 138/78   Pulse 82   Temp 98.4 F (36.9 C) (Oral)   Resp 14   Ht 5' 2.5" (1.588 m)   Wt 147 lb (66.7 kg)   SpO2 99%   BMI 26.46 kg/m  GEN- NAD, alert and oriented x3,afrebrile  HEENT- PERRL, EOMI, non injected sclera, pink conjunctiva, MMM, oropharynx clear CVS- RRR, no murmur RESP-CTAB ABD-NABS,soft,TTP suprapubic region, mild bilat flank pain, no rebound, no guarding  Pulses- Radial 2+        Assessment & Plan:      Problem List Items Addressed This Visit    UTI (urinary tract infection) - Primary    Start  macrobid based on sensitivites, send for culture, AZO, diflucan. Will plan to have her come back pending culture results to ensure clear, if not or recurrence send to urology  Also given number for Enviromental services to call for her well       Relevant Medications   nitrofurantoin, macrocrystal-monohydrate, (MACROBID) 100 MG capsule   fluconazole (DIFLUCAN) 150 MG tablet   Other Relevant Orders   Urinalysis, Routine w reflex microscopic (Completed)   Urine Culture      Note: This dictation was prepared with Dragon dictation along with smaller phrase technology. Any transcriptional errors that result from this process are unintentional.

## 2017-04-23 LAB — URINE CULTURE

## 2017-05-14 ENCOUNTER — Telehealth: Payer: Self-pay | Admitting: Family Medicine

## 2017-05-14 MED ORDER — CETIRIZINE HCL 10 MG PO TABS: 10.0000 mg | ORAL_TABLET | Freq: Every day | ORAL | 11 refills | Status: DC

## 2017-05-14 NOTE — Telephone Encounter (Signed)
PA submitted through [facility] and medication was denied. Per Dr. Buelah Manis ok to change to Zyrtec. Pharm and pt aware and med sent to pharm.

## 2017-05-17 ENCOUNTER — Encounter (HOSPITAL_COMMUNITY): Payer: Self-pay | Admitting: *Deleted

## 2017-05-17 ENCOUNTER — Ambulatory Visit (HOSPITAL_COMMUNITY)
Admission: EM | Admit: 2017-05-17 | Discharge: 2017-05-17 | Disposition: A | Discharge status: Home / Self Care | Payer: Medicaid Other | Attending: Emergency Medicine | Admitting: Emergency Medicine

## 2017-05-17 DIAGNOSIS — Z882 Allergy status to sulfonamides status: Secondary | ICD-10-CM | POA: Insufficient documentation

## 2017-05-17 DIAGNOSIS — N3090 Cystitis, unspecified without hematuria: Secondary | ICD-10-CM | POA: Diagnosis present

## 2017-05-17 DIAGNOSIS — K219 Gastro-esophageal reflux disease without esophagitis: Secondary | ICD-10-CM | POA: Diagnosis not present

## 2017-05-17 DIAGNOSIS — E559 Vitamin D deficiency, unspecified: Secondary | ICD-10-CM | POA: Diagnosis not present

## 2017-05-17 LAB — POCT URINALYSIS DIP (DEVICE)
BILIRUBIN URINE: NEGATIVE
GLUCOSE, UA: NEGATIVE mg/dL
KETONES UR: NEGATIVE mg/dL
LEUKOCYTES UA: NEGATIVE
Nitrite: POSITIVE — AB
Protein, ur: 100 mg/dL — AB
Urobilinogen, UA: 0.2 mg/dL (ref 0.0–1.0)
pH: 6 (ref 5.0–8.0)

## 2017-05-17 MED ORDER — FLUCONAZOLE 200 MG PO TABS: ORAL_TABLET | ORAL | 0 refills | Status: DC

## 2017-05-17 MED ORDER — PHENAZOPYRIDINE HCL 200 MG PO TABS: 200.0000 mg | ORAL_TABLET | Freq: Three times a day (TID) | ORAL | 0 refills | Status: DC | PRN

## 2017-05-17 MED ORDER — CEPHALEXIN 500 MG PO CAPS: 500.0000 mg | ORAL_CAPSULE | Freq: Four times a day (QID) | ORAL | 0 refills | Status: DC

## 2017-05-17 NOTE — ED Triage Notes (Signed)
Patient reports dysuria intermittent x 3 weeks. Reports lower abdominal pain with diarrhea.

## 2017-05-17 NOTE — ED Provider Notes (Signed)
[place]   [id number] 05/17/17 Arrival Time: 1229   SUBJECTIVE:  Heather Gillespie is a 56 y.o. female who presents to the urgent care with complaint of dysuria, reports painful burning with urination, is been on and off for 3 weeks. She is been treated twice before for UTI, reports being treated on her first visit with Bactrim, states she was unable to complete the medicine due to allergic reaction, she followed up with her primary care provider who started her on Macrobid. States she initially got better but then her symptoms have returned. 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 She has no nausea or vomiting, no weakness, does have lower pelvic pain, and back pain. No change in appetite or other symptoms     Past Medical History:  Diagnosis Date  . Abdominal pain   . Allergy   . GERD (gastroesophageal reflux disease)   . Glaucoma   . Hemorrhoids   . Hiatal hernia   . IBS (irritable bowel syndrome)   . N&V (nausea and vomiting)   . Osteoporosis   . Rectal bleeding   . Rectal ulcer   . Sinus problem   . TMJ (dislocation of temporomandibular joint)   . Vitamin D deficiency    Social History   Social History  . Marital status: Single    Spouse name: N/A  . Number of children: N/A  . Years of education: N/A   Occupational History  . Not on file.   Social History Main Topics  . Smoking status: Never Smoker  . Smokeless tobacco: Never Used  . Alcohol use No  . Drug use: No  . Sexual activity: Not on file   Other Topics Concern  . Not on file   Social History Narrative  . No narrative on file   No outpatient prescriptions have been marked as taking for the 05/17/17 encounter [facility] Encounter).   Allergies  Allergen Reactions  . Bactrim [Sulfamethoxazole-Trimethoprim]     Upset stomach  . Hydroxyzine  Pamoate Rash      ROS: As per HPI, remainder of ROS negative.   OBJECTIVE:  Vitals:   05/17/17 1319  BP: (!) 148/88  Pulse: 100  Resp: 18  Temp: 98.8 F (37.1 C)  TempSrc: Oral  SpO2: 100%       General Appearance:  awake, alert, oriented, in no acute distress, well developed, well nourished and in no acute distress Skin:  skin color, texture, turgor are normal Back:  Bilateral CVA tenderness Lungs:  Normal expansion.  Clear to auscultation.  No rales, rhonchi, or wheezing. Heart:  Heart regular rate and rhythm Abdomen:  Suprapubic tenderness Peripheral Pulses:  Capillary refill <2secs, strong peripheral pulses Psych exam:alert,oriented, in NAD with a full range of affect, normal behavior and no psychotic features     Labs:  Results for orders placed or performed during the hospital encounter of 05/17/17  POCT urinalysis dip (device)  Result Value Ref Range   Glucose, UA NEGATIVE NEGATIVE mg/dL   Bilirubin Urine NEGATIVE NEGATIVE   Ketones, ur NEGATIVE NEGATIVE mg/dL   Specific Gravity, Urine >=1.030 1.005 - 1.030   Hgb urine dipstick MODERATE (A) NEGATIVE   pH 6.0 5.0 - 8.0   Protein, ur 100 (A) NEGATIVE mg/dL   Urobilinogen, UA 0.2 0.0 - 1.0 mg/dL   Nitrite POSITIVE (A) NEGATIVE  Leukocytes, UA NEGATIVE NEGATIVE    Labs Reviewed  POCT URINALYSIS DIP (DEVICE) - Abnormal; Notable for the following:       Result Value   Hgb urine dipstick MODERATE (*)    Protein, ur 100 (*)    Nitrite POSITIVE (*)    All other components within normal limits  URINE CULTURE    No results found.     ASSESSMENT & PLAN:  1. Cystitis     Meds ordered this encounter  Medications  . cephALEXin (KEFLEX) 500 MG capsule    Sig: Take 1 capsule (500 mg total) by mouth 4 (four) times daily.    Dispense:  20 capsule    Refill:  0    Order Specific Question:   Supervising Provider    Answer:   Shelda Pal [[id number]]  . phenazopyridine (PYRIDIUM) 200 MG  tablet    Sig: Take 1 tablet (200 mg total) by mouth 3 (three) times daily as needed for pain.    Dispense:  10 tablet    Refill:  0    Order Specific Question:   Supervising Provider    Answer:   Shelda Pal [[id number]]  . fluconazole (DIFLUCAN) 200 MG tablet    Sig: Take one tablet today, wait 3 days, take the second tablet    Dispense:  2 tablet    Refill:  0    Order Specific Question:   Supervising Provider    Answer:   Shelda Pal [[id number]]    We'll send for urine culture, started on Keflex and Pyridium, follow-up with primary care as needed or return if symptoms worsen  Reviewed expectations re: course of current medical issues. Questions answered. Outlined signs and symptoms indicating need for more acute intervention. Patient verbalized understanding. After Visit Summary given.    Procedures:        Barnet Glasgow, NP 05/17/17 1401

## 2017-05-17 NOTE — Discharge Instructions (Signed)
You are being treated today for a urinary tract infection. I have prescribed Keflex, take 1 tablet 4 times a day for 5 days. I have also prescribed Pyridium. Take 1 tablet a day 3 times a day for 2 days. Your urine will be sent for culture and you will be notified should any change in therapy be needed. Drink plenty of fluids and rest. Should your symptoms fail to resolve, follow up with your primary care provider or return to clinic.  °

## 2017-05-19 ENCOUNTER — Telehealth (HOSPITAL_COMMUNITY): Payer: Self-pay | Admitting: Internal Medicine

## 2017-05-19 MED ORDER — AMOXICILLIN-POT CLAVULANATE 875-125 MG PO TABS: 1.0000 | ORAL_TABLET | Freq: Two times a day (BID) | ORAL | 0 refills | Status: DC

## 2017-05-19 NOTE — Telephone Encounter (Signed)
Please let patient know that urine culture is positive for E coli germ and Staph germ.  The E coli is resistant to cephalexin rx given at the urgent care visit, positive for ESBL but appears to be sensitive to amoxicillin/clavulanate.   The Staph germ sensitivities are still pending.   At this point would stop cephalexin and start amoxicillin/clavulanate; rx amoxicillin/clavulanate sent to the pharmacy of record, [facility] [address] [facility] [address].   Recheck or followup with primary care provider for further evaluation if symptoms are not improving.  Might benefit from  Infectious Disease consultation for optimal therapy.  LM

## 2017-05-20 LAB — URINE CULTURE: Culture: >=100000 — AB

## 2017-06-11 ENCOUNTER — Other Ambulatory Visit: Payer: Self-pay | Admitting: Family Medicine

## 2017-06-29 ENCOUNTER — Ambulatory Visit
Admission: RE | Admit: 2017-06-29 | Discharge: 2017-06-29 | Disposition: A | Discharge status: Home / Self Care | Payer: Medicaid Other | Source: Ambulatory Visit | Attending: Family Medicine | Admitting: Family Medicine

## 2017-06-29 DIAGNOSIS — Z780 Asymptomatic menopausal state: Secondary | ICD-10-CM | POA: Diagnosis not present

## 2017-06-29 DIAGNOSIS — M8580 Other specified disorders of bone density and structure, unspecified site: Secondary | ICD-10-CM

## 2017-06-29 DIAGNOSIS — Z1231 Encounter for screening mammogram for malignant neoplasm of breast: Secondary | ICD-10-CM | POA: Diagnosis not present

## 2017-06-29 DIAGNOSIS — M810 Age-related osteoporosis without current pathological fracture: Secondary | ICD-10-CM | POA: Diagnosis not present

## 2017-07-07 ENCOUNTER — Telehealth: Payer: Self-pay | Admitting: *Deleted

## 2017-07-07 MED ORDER — FEXOFENADINE HCL 180 MG PO TABS: 180.0000 mg | ORAL_TABLET | Freq: Every day | ORAL | 11 refills | Status: DC

## 2017-07-07 NOTE — Telephone Encounter (Signed)
Received PA determination.   PA [id number] Approved 07/07/2017- 07/06/2018.

## 2017-07-07 NOTE — Telephone Encounter (Signed)
Received request from pharmacy for [facility] [address].   PA submitted. Confirmation # [id number]   Dx: seasonal allergies

## 2017-08-27 ENCOUNTER — Telehealth: Payer: Self-pay | Admitting: Family Medicine

## 2017-08-27 MED ORDER — PATADAY 0.2 % OP SOLN: 1.0000 [drp] | Freq: Every day | OPHTHALMIC | 11 refills | Status: DC

## 2017-08-27 NOTE — Telephone Encounter (Signed)
Ins will not cover Patanol but will cover Name brand Pataday. New rx sent to pharm. (Must be NAME BRAND).

## 2017-09-29 ENCOUNTER — Other Ambulatory Visit: Payer: Self-pay | Admitting: Family Medicine

## 2017-09-30 ENCOUNTER — Other Ambulatory Visit: Payer: Self-pay | Admitting: Family Medicine

## 2017-10-20 ENCOUNTER — Other Ambulatory Visit: Payer: Self-pay | Admitting: Family Medicine

## 2017-12-13 ENCOUNTER — Ambulatory Visit (HOSPITAL_COMMUNITY)
Admission: EM | Admit: 2017-12-13 | Discharge: 2017-12-13 | Disposition: A | Discharge status: Home / Self Care | Payer: Medicaid Other | Attending: Internal Medicine | Admitting: Internal Medicine

## 2017-12-13 ENCOUNTER — Encounter (HOSPITAL_COMMUNITY): Payer: Self-pay | Admitting: *Deleted

## 2017-12-13 ENCOUNTER — Other Ambulatory Visit: Payer: Self-pay

## 2017-12-13 DIAGNOSIS — E559 Vitamin D deficiency, unspecified: Secondary | ICD-10-CM | POA: Diagnosis not present

## 2017-12-13 DIAGNOSIS — K219 Gastro-esophageal reflux disease without esophagitis: Secondary | ICD-10-CM | POA: Insufficient documentation

## 2017-12-13 DIAGNOSIS — Z7983 Long term (current) use of bisphosphonates: Secondary | ICD-10-CM | POA: Diagnosis not present

## 2017-12-13 DIAGNOSIS — R319 Hematuria, unspecified: Secondary | ICD-10-CM

## 2017-12-13 DIAGNOSIS — R300 Dysuria: Secondary | ICD-10-CM | POA: Insufficient documentation

## 2017-12-13 DIAGNOSIS — M549 Dorsalgia, unspecified: Secondary | ICD-10-CM

## 2017-12-13 DIAGNOSIS — Z79899 Other long term (current) drug therapy: Secondary | ICD-10-CM | POA: Insufficient documentation

## 2017-12-13 DIAGNOSIS — M810 Age-related osteoporosis without current pathological fracture: Secondary | ICD-10-CM | POA: Diagnosis not present

## 2017-12-13 DIAGNOSIS — Z7951 Long term (current) use of inhaled steroids: Secondary | ICD-10-CM | POA: Diagnosis not present

## 2017-12-13 DIAGNOSIS — N10 Acute tubulo-interstitial nephritis: Secondary | ICD-10-CM

## 2017-12-13 DIAGNOSIS — Z882 Allergy status to sulfonamides status: Secondary | ICD-10-CM | POA: Insufficient documentation

## 2017-12-13 DIAGNOSIS — Z888 Allergy status to other drugs, medicaments and biological substances status: Secondary | ICD-10-CM | POA: Insufficient documentation

## 2017-12-13 DIAGNOSIS — K589 Irritable bowel syndrome without diarrhea: Secondary | ICD-10-CM | POA: Insufficient documentation

## 2017-12-13 DIAGNOSIS — Z9049 Acquired absence of other specified parts of digestive tract: Secondary | ICD-10-CM | POA: Diagnosis not present

## 2017-12-13 LAB — POCT URINALYSIS DIP (DEVICE)
Bilirubin Urine: NEGATIVE
Glucose, UA: NEGATIVE mg/dL
Ketones, ur: NEGATIVE mg/dL
NITRITE: POSITIVE — AB
PH: 6.5 (ref 5.0–8.0)
Protein, ur: 100 mg/dL — AB
Specific Gravity, Urine: 1.025 (ref 1.005–1.030)
UROBILINOGEN UA: 0.2 mg/dL (ref 0.0–1.0)

## 2017-12-13 MED ORDER — PHENAZOPYRIDINE HCL 200 MG PO TABS: 200.0000 mg | ORAL_TABLET | Freq: Three times a day (TID) | ORAL | 0 refills | Status: DC | PRN

## 2017-12-13 MED ORDER — CEFTRIAXONE SODIUM 1 G IJ SOLR
1.0000 g | Freq: Once | INTRAMUSCULAR | Status: AC
  Administered 2017-12-13: 1 g via INTRAMUSCULAR

## 2017-12-13 MED ORDER — LIDOCAINE HCL 2 % IJ SOLN
INTRAMUSCULAR | Status: AC
  Filled 2017-12-13: qty 20

## 2017-12-13 MED ORDER — CEFTRIAXONE SODIUM 1 G IJ SOLR
INTRAMUSCULAR | Status: AC
  Filled 2017-12-13: qty 10

## 2017-12-13 MED ORDER — AMOXICILLIN-POT CLAVULANATE 875-125 MG PO TABS: 1.0000 | ORAL_TABLET | Freq: Two times a day (BID) | ORAL | 0 refills | Status: DC

## 2017-12-13 NOTE — ED Provider Notes (Signed)
MRN: [id number] DOB: [birth year]  Subjective:   Heather Gillespie is a 56 y.o. female presenting for 10 day history of dysuria, hematuria, cloudy malordorous urine and subjective fever, bilateral flank pain, nausea. Has not tried any medications for relief. She is trying to hydrate well. Denies urinary frequency, urinary urgency, abdominal pain, pelvic pain, genital rash and vaginal discharge, vomiting. Denies smoking cigarettes. Her last episode of UTI was 05/2017 and urine culture resulted with resistant E. coli and staph infections. She did well with Augmentin then.   No current facility-administered medications for this encounter.   Current Outpatient Medications:  .  alendronate (FOSAMAX) 70 MG tablet, Take 1 tablet (70 mg total) by mouth every 7 (seven) days. Take with a full glass of water on an empty stomach., Disp: 4 tablet, Rfl: 11 .  atorvastatin (LIPITOR) 20 MG tablet, TAKE 1 TABLET BY MOUTH EVERY EVENING., Disp: 90 tablet, Rfl: 3 .  dexlansoprazole (DEXILANT) 60 MG capsule, Take 60 mg by mouth daily., Disp: , Rfl:  .  Ergocalciferol (VITAMIN D2) 2000 units TABS, Take 1 tablet by mouth daily., Disp: , Rfl:  .  fexofenadine (ALLEGRA) 180 MG tablet, Take 1 tablet (180 mg total) by mouth daily., Disp: 30 tablet, Rfl: 11 .  fluticasone (FLONASE) 50 MCG/ACT nasal spray, PLACE 2 SPRAYS INTO EACH NOSTRIL ONCE DAILY, Disp: 48 g, Rfl: 3 .  gabapentin (NEURONTIN) 300 MG capsule, TAKE ONE CAPSULE BY MOUTH 3 TIMES A DAY, Disp: 90 capsule, Rfl: 5 .  losartan (COZAAR) 100 MG tablet, TAKE 1 TABLET EVERY DAY, Disp: 90 tablet, Rfl: 2 .  PATADAY 0.2 % SOLN, Apply 1 drop to eye daily., Disp: 1 Bottle, Rfl: 11   Allergies  Allergen Reactions  . Bactrim [Sulfamethoxazole-Trimethoprim]     Upset stomach  . Hydroxyzine Pamoate Rash    Past Medical History:  Diagnosis Date  . Abdominal pain   . Allergy   . GERD (gastroesophageal reflux disease)   . Glaucoma   . Hemorrhoids   . Hiatal hernia   .  IBS (irritable bowel syndrome)   . N&V (nausea and vomiting)   . Osteoporosis   . Rectal bleeding   . Rectal ulcer   . Sinus problem   . TMJ (dislocation of temporomandibular joint)   . Vitamin D deficiency      Past Surgical History:  Procedure Laterality Date  . ANAL FISSURE REPAIR    . CHOLECYSTECTOMY  1984  . COLONOSCOPY W/ POLYPECTOMY     colon ulcers  . EYE SURGERY    . HEMI-MICRODISCECTOMY LUMBAR LAMINECTOMY LEVEL 1 Left 05/26/2013   Procedure: HEMI-MICRODISCECTOMY LUMBAR LAMINECTOMY L4 - L5 ON THE LEFT LEVEL 1;  Surgeon: Tobi Bastos, MD;  Location: [facility];  Service: Orthopedics;  Laterality: Left;  . HEMORROIDECTOMY      Objective:   Vitals: BP (!) 147/79 (BP Location: Left Arm)   Pulse 99   Temp 98.5 F (36.9 C) (Oral)   SpO2 99%   Physical Exam  Constitutional: She is oriented to person, place, and time. She appears well-developed and well-nourished.  HENT:  Mouth/Throat: Oropharynx is clear and moist.  Cardiovascular: Normal rate, regular rhythm and intact distal pulses. Exam reveals no gallop and no friction rub.  No murmur heard. Pulmonary/Chest: No respiratory distress. She has no wheezes. She has no rales.  Abdominal: Soft. Bowel sounds are normal. She exhibits no distension and no mass. There is tenderness (generalized throughout). There is CVA tenderness (bilateral). There is no  rebound and no guarding.  Neurological: She is alert and oriented to person, place, and time.  Skin: Skin is warm and dry.   Results for orders placed or performed during the hospital encounter of 12/13/17 (from the past 24 hour(s))  POCT urinalysis dip (device)     Status: Abnormal   Collection Time: 12/13/17 12:16 PM  Result Value Ref Range   Glucose, UA NEGATIVE NEGATIVE mg/dL   Bilirubin Urine NEGATIVE NEGATIVE   Ketones, ur NEGATIVE NEGATIVE mg/dL   Specific Gravity, Urine 1.025 1.005 - 1.030   Hgb urine dipstick LARGE (A) NEGATIVE   pH 6.5 5.0 - 8.0   Protein, ur  100 (A) NEGATIVE mg/dL   Urobilinogen, UA 0.2 0.0 - 1.0 mg/dL   Nitrite POSITIVE (A) NEGATIVE   Leukocytes, UA SMALL (A) NEGATIVE   Assessment and Plan :   Acute pyelonephritis  CVA tenderness  Dysuria  Will manage as an outpatient given stable vitals. IM ceftriaxone in clinic, Augmentin as an outpatient. Previous urine culture from 05/2017 showed E. coli was resistant to cephalosporins and fluoroquinolones, patient has allergies to Bactrim. Will follow UpToDate algorithm for outpatient management of pyelonephritis. Return-to-clinic precautions discussed, patient verbalized understanding.    Jaynee Eagles, PA-C 12/13/17 1244

## 2017-12-13 NOTE — ED Triage Notes (Signed)
Per pt this started 10 days ago, per pt her lower back hurts and she thinks it's her kidney, per pt she is having irregular pee.

## 2017-12-13 NOTE — Discharge Instructions (Signed)
Hydrate well with at least 2 liters (1 gallon) of water daily. Use Pyridium for your urinary pain. Do not wait more than 2 days to come back if you are not feeling better including developing fever, worsening back pain or burning when you pee.

## 2017-12-15 ENCOUNTER — Telehealth (HOSPITAL_COMMUNITY): Payer: Self-pay

## 2017-12-15 ENCOUNTER — Telehealth: Payer: Self-pay | Admitting: Internal Medicine

## 2017-12-15 LAB — URINE CULTURE: Culture: >=100000 — AB

## 2017-12-15 MED ORDER — NITROFURANTOIN MONOHYD MACRO 100 MG PO CAPS: 100.0000 mg | ORAL_CAPSULE | Freq: Two times a day (BID) | ORAL | 0 refills | Status: AC

## 2017-12-15 NOTE — Telephone Encounter (Signed)
Clinical staff, please let patient know that urine culture was positive for E coli germ, intermediate sensitivity to amoxicillin/clavulanate prescription given at the urgent care visit.  Would stop amoxicillin/clavualante.  Rx nitrofurantoin sent to the pharmacy of record, [facility] [address] at [place].  Take all of the nitrofurantoin.  Recheck or followup with your primary care provider for further evaluation if symptoms are not improving.  LM

## 2017-12-15 NOTE — Telephone Encounter (Signed)
Pt contacted regarding results from recent visit and need for another antibiotic. Pt verbalized understanding.

## 2017-12-17 ENCOUNTER — Other Ambulatory Visit: Payer: Self-pay | Admitting: Family Medicine

## 2018-01-07 ENCOUNTER — Encounter (HOSPITAL_COMMUNITY): Payer: Self-pay | Admitting: Emergency Medicine

## 2018-01-07 ENCOUNTER — Ambulatory Visit (INDEPENDENT_AMBULATORY_CARE_PROVIDER_SITE_OTHER): Payer: Medicaid Other

## 2018-01-07 ENCOUNTER — Other Ambulatory Visit: Payer: Self-pay

## 2018-01-07 ENCOUNTER — Ambulatory Visit (HOSPITAL_COMMUNITY)
Admission: EM | Admit: 2018-01-07 | Discharge: 2018-01-07 | Disposition: A | Discharge status: Home / Self Care | Payer: Medicaid Other | Attending: Internal Medicine | Admitting: Internal Medicine

## 2018-01-07 DIAGNOSIS — S8000XA Contusion of unspecified knee, initial encounter: Secondary | ICD-10-CM

## 2018-01-07 DIAGNOSIS — W19XXXA Unspecified fall, initial encounter: Secondary | ICD-10-CM

## 2018-01-07 DIAGNOSIS — S93402A Sprain of unspecified ligament of left ankle, initial encounter: Secondary | ICD-10-CM

## 2018-01-07 DIAGNOSIS — S8001XA Contusion of right knee, initial encounter: Secondary | ICD-10-CM

## 2018-01-07 DIAGNOSIS — S8002XA Contusion of left knee, initial encounter: Secondary | ICD-10-CM | POA: Diagnosis not present

## 2018-01-07 MED ORDER — NAPROXEN 375 MG PO TABS: 375.0000 mg | ORAL_TABLET | Freq: Two times a day (BID) | ORAL | 0 refills | Status: DC

## 2018-01-07 NOTE — ED Provider Notes (Signed)
[place]    CSN: [id number] Arrival date & time: 01/07/18  1203     History   Chief Complaint Chief Complaint  Patient presents with  . Leg Pain  . Ankle Pain    HPI Heather Gillespie is a 57 y.o. female.   Heather Gillespie presents today with complaints of bilateral knee pain and left ankle pain after she tripped and fell 4/21 on her driveway, landing on her knees. She had a hard time getting up due to pain but has since been ambulatory. Denies any previous surgeries to knee or ankle, or any previous fractures. Takes gabapentin for pain which has not helped, history of back pain and radiation down legs. Hx of osteoporosis, spinal stenosis, ibs, uti's.     ROS per HPI.      Past Medical History:  Diagnosis Date  . Abdominal pain   . Allergy   . GERD (gastroesophageal reflux disease)   . Glaucoma   . Hemorrhoids   . Hiatal hernia   . IBS (irritable bowel syndrome)   . N&V (nausea and vomiting)   . Osteoporosis   . Rectal bleeding   . Rectal ulcer   . Sinus problem   . TMJ (dislocation of temporomandibular joint)   . Vitamin D deficiency     Patient Active Problem List   Diagnosis Date Noted  . Spinal stenosis, lumbar region, with neurogenic claudication 05/26/2013  . 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 Vitamin D deficiency   . TMJ (dislocation of temporomandibular joint)   . Osteoporosis   . Rectal pain 03/31/2011  . ANEMIA, IRON DEFICIENCY NOS 02/19/2007  . [place] SYNDROME 02/19/2007  . HEMORRHOIDS NOS, W/O COMPLICATIONS 40/98/1191  . RHINITIS, ALLERGIC NOS 02/19/2007  . IBS 02/19/2007  . DIARRHEA, CHRONIC 02/19/2007  . UTI (urinary tract infection) 02/19/2007    Past Surgical History:  Procedure Laterality Date  . ANAL FISSURE REPAIR    . CHOLECYSTECTOMY  1984  . COLONOSCOPY W/ POLYPECTOMY     colon ulcers  . EYE SURGERY    . HEMI-MICRODISCECTOMY LUMBAR LAMINECTOMY LEVEL 1 Left 05/26/2013   Procedure: HEMI-MICRODISCECTOMY LUMBAR LAMINECTOMY L4 - L5 ON THE LEFT LEVEL 1;  Surgeon: Tobi Bastos, MD;  Location: [facility];  Service: Orthopedics;  Laterality: Left;  . HEMORROIDECTOMY      OB History   None      Home Medications    Prior to Admission medications   Medication Sig Start Date End Date Taking? Authorizing Provider  alendronate (FOSAMAX) 70 MG tablet Take 1 tablet (70 mg total) by mouth every 7 (seven) days. Take with a full glass of water on an empty stomach. 12/14/15   Susy Frizzle, MD  atorvastatin (LIPITOR) 20 MG tablet TAKE 1 TABLET BY MOUTH EVERY EVENING. 03/09/17   Susy Frizzle, MD  dexlansoprazole (DEXILANT) 60 MG capsule Take 60 mg by mouth daily.    [provider]  Ergocalciferol (VITAMIN D2) 2000 units TABS Take 1 tablet by mouth daily.    [provider]  fexofenadine (ALLEGRA) 180 MG tablet Take 1 tablet (180 mg total) by mouth daily. 07/07/17   Susy Frizzle, MD  fluticasone (FLONASE) 50 MCG/ACT nasal spray PLACE 2 SPRAYS INTO EACH NOSTRIL ONCE DAILY 06/11/17   Susy Frizzle, MD  gabapentin (NEURONTIN) 300 MG capsule TAKE ONE CAPSULE BY MOUTH 3 TIMES A DAY 10/20/17   Susy Frizzle, MD  losartan (COZAAR) 100 MG tablet  TAKE 1 TABLET EVERY DAY 12/17/17   Susy Frizzle, MD  naproxen (NAPROSYN) 375 MG tablet Take 1 tablet (375 mg total) by mouth 2 (two) times daily. 01/07/18   Arther Heisler, Lanelle Bal B, NP  PATADAY 0.2 % SOLN Apply 1 drop to eye daily. 08/27/17   Susy Frizzle, MD  phenazopyridine (PYRIDIUM) 200 MG tablet Take 1 tablet (200 mg total) by mouth 3 (three) times daily as needed for pain. 12/13/17   Jaynee Eagles, PA-C    Family History Family History  Problem Relation Age of Onset  . Diabetes Mother   . Stroke Father   . Breast cancer Neg Hx     Social History Social History   Tobacco Use  . Smoking status: Never Smoker  . Smokeless tobacco: Never Used  Substance Use Topics  . Alcohol use: No  . Drug  use: No     Allergies   Bactrim [sulfamethoxazole-trimethoprim] and Hydroxyzine pamoate   Review of Systems Review of Systems   Physical Exam Triage Vital Signs ED Triage Vitals  Enc Vitals Group     BP 01/07/18 1234 (!) 157/87     Pulse Rate 01/07/18 1234 (!) 110     Resp --      Temp 01/07/18 1234 98.4 F (36.9 C)     Temp Source 01/07/18 1234 Oral     SpO2 01/07/18 1234 99 %     Weight --      Height --      Head Circumference --      Peak Flow --      Pain Score 01/07/18 1235 10     Pain Loc --      Pain Edu? --      Excl. in [place]? --    No data found.  Updated Vital Signs BP (!) 157/87 (BP Location: Left Arm)   Pulse (!) 110   Temp 98.4 F (36.9 C) (Oral)   SpO2 99%    Physical Exam  Constitutional: She is oriented to person, place, and time. She appears well-developed and well-nourished. No distress.  Cardiovascular: Normal rate, regular rhythm and normal heart sounds.  Pulmonary/Chest: Effort normal and breath sounds normal.  Musculoskeletal:       Right knee: She exhibits swelling, ecchymosis and bony tenderness. She exhibits normal range of motion, no deformity, no laceration, normal alignment, no LCL laxity, normal patellar mobility, normal meniscus and no MCL laxity.       Left knee: She exhibits swelling, ecchymosis and bony tenderness. She exhibits normal range of motion, no deformity, no laceration, no erythema, normal alignment, no LCL laxity, normal patellar mobility, normal meniscus and no MCL laxity. Tenderness found. Medial joint line, lateral joint line and patellar tendon tenderness noted.       Left ankle: She exhibits normal range of motion and no swelling. Tenderness. Lateral malleolus tenderness found. Achilles tendon normal.       Legs: biltaral knees with bruising and tenderness, primarily to patellar tender; left knee with lateral and medial tenderness as well; mild pain with flexion and extension but generally without change to ROm; left  ankle with complete ROM, mild tenderness to left lateral malleolus; without foot tenderness; wiggles toes; sensation intact to bilateral feet; strength equal bilaterally with push/pulls and with ankle flexion and dorsiflexion; strong pedal pulses bilaterally; ambulatory  Neurological: She is alert and oriented to person, place, and time.  Skin: Skin is warm and dry.     UC Treatments / Results  Labs (all labs ordered are listed, but only abnormal results are displayed) Labs Reviewed - No data to display  EKG None Radiology Dg Ankle Complete Left  Result Date: 01/07/2018 CLINICAL DATA:  Twisting injury with subsequent fall.  Lateral pain. EXAM: LEFT ANKLE COMPLETE - 3+ VIEW COMPARISON:  None. FINDINGS: Regional soft tissue swelling. No evidence of fracture or dislocation. IMPRESSION: Soft tissue swelling.  No bone abnormality. Electronically Signed   By: Nelson Chimes M.D.   On: 01/07/2018 13:53   Dg Knee Ap/lat W/sunrise Left  Result Date: 01/07/2018 CLINICAL DATA:  Ankle injury with knee pain EXAM: LEFT KNEE 3 VIEWS COMPARISON:  None. FINDINGS: No evidence of fracture, dislocation, or joint effusion. No evidence of arthropathy or other focal bone abnormality. Soft tissues are unremarkable. IMPRESSION: Normal left knee. Electronically Signed   By: Ulyses Jarred M.D.   On: 01/07/2018 13:53   Dg Knee Ap/lat W/sunrise Right  Result Date: 01/07/2018 CLINICAL DATA:  Injury EXAM: RIGHT KNEE 3 VIEWS COMPARISON:  None. FINDINGS: No acute fracture. No dislocation. Unremarkable soft tissues. Lateral tilting of the patella. IMPRESSION: No acute bony pathology. Electronically Signed   By: Marybelle Killings M.D.   On: 01/07/2018 13:52    Procedures Procedures (including critical care time)  Medications Ordered in UC Medications - No data to display   Initial Impression / Assessment and Plan / UC Course  I have reviewed the triage vital signs and the nursing notes.  Pertinent labs & imaging results  that were available during my care of the patient were reviewed by me and considered in my medical decision making (see chart for details).     Without acute findings on imaging at this time. Consistent with strain as well as contusions to bilateral knees. Activity as tolerated. Ace wrap to left ankle and as needed to left knee. Naproxen twice a day. Follow with PCP as needed. Patient verbalized understanding and agreeable to plan.  Ambulatory out of clinic without difficulty.    Final Clinical Impressions(s) / UC Diagnoses   Final diagnoses:  Fall, initial encounter  Sprain of left ankle, unspecified ligament, initial encounter  Contusion of knee, unspecified laterality, initial encounter    ED Discharge Orders        Ordered    naproxen (NAPROSYN) 375 MG tablet  2 times daily     01/07/18 1402       Controlled Substance Prescriptions [place] Controlled Substance Registry consulted? Not Applicable   Zigmund Gottron, NP 01/07/18 1407

## 2018-01-07 NOTE — Discharge Instructions (Signed)
Ice and elevation of knees and ankle as able to help with pain. Ace wrap to left ankle for support and to help with swelling. Activity as tolerated. Naproxen twice a day, take with food. If symptoms worsen or do not improve in the 2-3 weeks to return to be seen or to follow up with PCP.

## 2018-01-31 ENCOUNTER — Encounter (HOSPITAL_COMMUNITY): Payer: Self-pay

## 2018-01-31 ENCOUNTER — Ambulatory Visit (HOSPITAL_COMMUNITY)
Admission: EM | Admit: 2018-01-31 | Discharge: 2018-01-31 | Disposition: A | Discharge status: Home / Self Care | Payer: Medicaid Other | Attending: Family Medicine | Admitting: Family Medicine

## 2018-01-31 ENCOUNTER — Other Ambulatory Visit: Payer: Self-pay

## 2018-01-31 DIAGNOSIS — R110 Nausea: Secondary | ICD-10-CM | POA: Insufficient documentation

## 2018-01-31 DIAGNOSIS — Z833 Family history of diabetes mellitus: Secondary | ICD-10-CM | POA: Insufficient documentation

## 2018-01-31 DIAGNOSIS — Z823 Family history of stroke: Secondary | ICD-10-CM | POA: Insufficient documentation

## 2018-01-31 DIAGNOSIS — Z888 Allergy status to other drugs, medicaments and biological substances status: Secondary | ICD-10-CM | POA: Diagnosis not present

## 2018-01-31 DIAGNOSIS — E559 Vitamin D deficiency, unspecified: Secondary | ICD-10-CM | POA: Insufficient documentation

## 2018-01-31 DIAGNOSIS — Z9049 Acquired absence of other specified parts of digestive tract: Secondary | ICD-10-CM | POA: Diagnosis not present

## 2018-01-31 DIAGNOSIS — Z79891 Long term (current) use of opiate analgesic: Secondary | ICD-10-CM | POA: Insufficient documentation

## 2018-01-31 DIAGNOSIS — K219 Gastro-esophageal reflux disease without esophagitis: Secondary | ICD-10-CM | POA: Insufficient documentation

## 2018-01-31 DIAGNOSIS — K589 Irritable bowel syndrome without diarrhea: Secondary | ICD-10-CM | POA: Insufficient documentation

## 2018-01-31 DIAGNOSIS — N12 Tubulo-interstitial nephritis, not specified as acute or chronic: Secondary | ICD-10-CM | POA: Diagnosis not present

## 2018-01-31 DIAGNOSIS — Z79899 Other long term (current) drug therapy: Secondary | ICD-10-CM | POA: Diagnosis not present

## 2018-01-31 DIAGNOSIS — M48062 Spinal stenosis, lumbar region with neurogenic claudication: Secondary | ICD-10-CM | POA: Diagnosis not present

## 2018-01-31 DIAGNOSIS — Z87440 Personal history of urinary (tract) infections: Secondary | ICD-10-CM | POA: Diagnosis present

## 2018-01-31 DIAGNOSIS — Z9889 Other specified postprocedural states: Secondary | ICD-10-CM | POA: Insufficient documentation

## 2018-01-31 LAB — POCT URINALYSIS DIP (DEVICE)
Bilirubin Urine: NEGATIVE
Glucose, UA: NEGATIVE mg/dL
Nitrite: POSITIVE — AB
PROTEIN: 100 mg/dL — AB
UROBILINOGEN UA: 0.2 mg/dL (ref 0.0–1.0)
pH: 5.5 (ref 5.0–8.0)

## 2018-01-31 MED ORDER — HYDROCODONE-ACETAMINOPHEN 5-325 MG PO TABS: 2.0000 | ORAL_TABLET | ORAL | 0 refills | Status: DC | PRN

## 2018-01-31 MED ORDER — SULFAMETHOXAZOLE-TRIMETHOPRIM 800-160 MG PO TABS: 1.0000 | ORAL_TABLET | Freq: Two times a day (BID) | ORAL | 0 refills | Status: AC

## 2018-01-31 MED ORDER — ONDANSETRON HCL 4 MG PO TABS: 4.0000 mg | ORAL_TABLET | Freq: Three times a day (TID) | ORAL | 0 refills | Status: DC | PRN

## 2018-01-31 NOTE — ED Provider Notes (Signed)
[place]    CSN: [id number] Arrival date & time: 01/31/18  1207     History   Chief Complaint Chief Complaint  Patient presents with  . Urinary Tract Infection    HPI Heather Gillespie is a 57 y.o. female.   HPI  Patient has recurring urinary tract infections.  She was just treated for a couple of months ago.  She currently has burning, frequency, and urgency.  She states she feels she needs to go the bathroom and then only goes a small amount.  She is also developed some mild nausea.  No vomiting.  No fever or chills.  She has some pain in her left flank.  She states she has had kidney infections in the past.  No kidney stones.  I reviewed her past urinary tract infections and it appears she usually has E. coli.  Her last E. coli was quite resistant, and she was treated with Macrodantin.  Past Medical History:  Diagnosis Date  . Abdominal pain   . Allergy   . GERD (gastroesophageal reflux disease)   . Glaucoma   . Hemorrhoids   . Hiatal hernia   . IBS (irritable bowel syndrome)   . N&V (nausea and vomiting)   . Osteoporosis   . Rectal bleeding   . Rectal ulcer   . Sinus problem   . TMJ (dislocation of temporomandibular joint)   . Vitamin D deficiency     Patient Active Problem List   Diagnosis Date Noted  . Spinal stenosis, lumbar region, with neurogenic claudication 05/26/2013  . 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 Vitamin D deficiency   . TMJ (dislocation of temporomandibular joint)   . Osteoporosis   . Rectal pain 03/31/2011  . ANEMIA, IRON DEFICIENCY NOS 02/19/2007  . [place] SYNDROME 02/19/2007  . HEMORRHOIDS NOS, W/O COMPLICATIONS 35/45/6256  . RHINITIS, ALLERGIC NOS 02/19/2007  . IBS 02/19/2007  . DIARRHEA, CHRONIC 02/19/2007  . UTI (urinary tract infection) 02/19/2007    Past Surgical History:  Procedure Laterality Date  . ANAL FISSURE REPAIR    . CHOLECYSTECTOMY  1984  . COLONOSCOPY  W/ POLYPECTOMY     colon ulcers  . EYE SURGERY    . HEMI-MICRODISCECTOMY LUMBAR LAMINECTOMY LEVEL 1 Left 05/26/2013   Procedure: HEMI-MICRODISCECTOMY LUMBAR LAMINECTOMY L4 - L5 ON THE LEFT LEVEL 1;  Surgeon: Tobi Bastos, MD;  Location: [facility];  Service: Orthopedics;  Laterality: Left;  . HEMORROIDECTOMY      OB History   None      Home Medications    Prior to Admission medications   Medication Sig Start Date End Date Taking? Authorizing Provider  dexlansoprazole (DEXILANT) 60 MG capsule Take 60 mg by mouth daily.   Yes [provider]  Ergocalciferol (VITAMIN D2) 2000 units TABS Take 1 tablet by mouth daily.   Yes [provider]  fexofenadine (ALLEGRA) 180 MG tablet Take 1 tablet (180 mg total) by mouth daily. 07/07/17  Yes Susy Frizzle, MD  fluticasone (FLONASE) 50 MCG/ACT nasal spray PLACE 2 SPRAYS INTO EACH NOSTRIL ONCE DAILY 06/11/17  Yes Susy Frizzle, MD  gabapentin (NEURONTIN) 300 MG capsule TAKE ONE CAPSULE BY MOUTH 3 TIMES A DAY 10/20/17  Yes Susy Frizzle, MD  losartan (COZAAR) 100 MG tablet TAKE 1 TABLET EVERY DAY 12/17/17  Yes Susy Frizzle, MD  PATADAY 0.2 % SOLN Apply 1 drop to eye daily. 08/27/17  Yes Susy Frizzle, MD  alendronate (FOSAMAX) 70 MG tablet Take 1 tablet (70 mg total) by mouth every 7 (seven) days. Take with a full glass of water on an empty stomach. 12/14/15   Susy Frizzle, MD  atorvastatin (LIPITOR) 20 MG tablet TAKE 1 TABLET BY MOUTH EVERY EVENING. 03/09/17   Susy Frizzle, MD  HYDROcodone-acetaminophen (NORCO/VICODIN) 5-325 MG tablet Take 2 tablets by mouth every 4 (four) hours as needed. 01/31/18   Raylene Everts, MD  naproxen (NAPROSYN) 375 MG tablet Take 1 tablet (375 mg total) by mouth 2 (two) times daily. 01/07/18   Zigmund Gottron, NP  ondansetron (ZOFRAN) 4 MG tablet Take 1 tablet (4 mg total) by mouth every 8 (eight) hours as needed for nausea or vomiting. 01/31/18   Raylene Everts, MD    phenazopyridine (PYRIDIUM) 200 MG tablet Take 1 tablet (200 mg total) by mouth 3 (three) times daily as needed for pain. 12/13/17   Jaynee Eagles, PA-C  sulfamethoxazole-trimethoprim (BACTRIM DS,SEPTRA DS) 800-160 MG tablet Take 1 tablet by mouth 2 (two) times daily for 7 days. 01/31/18 02/07/18  Raylene Everts, MD    Family History Family History  Problem Relation Age of Onset  . Diabetes Mother   . Stroke Father   . Breast cancer Neg Hx     Social History Social History   Tobacco Use  . Smoking status: Never Smoker  . Smokeless tobacco: Never Used  Substance Use Topics  . Alcohol use: No  . Drug use: No     Allergies   Hydroxyzine pamoate   Review of Systems Review of Systems  Constitutional: Negative for chills and fever.  HENT: Negative for ear pain and sore throat.   Eyes: Negative for pain and visual disturbance.  Respiratory: Negative for cough and shortness of breath.   Cardiovascular: Negative for chest pain and palpitations.  Gastrointestinal: Positive for nausea. Negative for abdominal pain, rectal pain and vomiting.  Genitourinary: Positive for dysuria, flank pain and frequency. Negative for hematuria.  Musculoskeletal: Negative for arthralgias and back pain.  Skin: Negative for color change and rash.  Neurological: Negative for seizures and headaches.  All other systems reviewed and are negative.    Physical Exam Triage Vital Signs ED Triage Vitals  Enc Vitals Group     BP 01/31/18 1234 (!) 154/72     Pulse Rate 01/31/18 1234 93     Resp 01/31/18 1234 17     Temp 01/31/18 1234 98.6 F (37 C)     Temp Source 01/31/18 1234 Oral     SpO2 01/31/18 1234 97 %     Weight --      Height --      Head Circumference --      Peak Flow --      Pain Score 01/31/18 1236 10     Pain Loc --      Pain Edu? --      Excl. in [place]? --    No data found.  Updated Vital Signs BP (!) 154/72 (BP Location: Left Arm)   Pulse 93   Temp 98.6 F (37 C) (Oral)    Resp 17   SpO2 97%   Visual Acuity Right Eye Distance:   Left Eye Distance:   Bilateral Distance:    Right Eye Near:   Left Eye Near:    Bilateral Near:     Physical Exam  Constitutional: She appears well-developed and well-nourished. No distress.  HENT:  Head: Normocephalic and atraumatic.  Mouth/Throat: Oropharynx is clear and moist.  Eyes: Conjunctivae are normal.  Neck: Neck supple.  Cardiovascular: Normal rate and regular rhythm.  No murmur heard. Pulmonary/Chest: Effort normal and breath sounds normal. No respiratory distress.  Abdominal: Soft. She exhibits no distension. There is no tenderness.  Mild CVA tenderness on left  Musculoskeletal: Normal range of motion. She exhibits no edema.  No muscular tenderness of back  Neurological: She is alert.  Skin: Skin is warm and dry.  Psychiatric: She has a normal mood and affect.  Nursing note and vitals reviewed.    UC Treatments / Results  Labs (all labs ordered are listed, but only abnormal results are displayed) Labs Reviewed  POCT URINALYSIS DIP (DEVICE) - Abnormal; Notable for the following components:      Result Value   Ketones, ur TRACE (*)    Hgb urine dipstick LARGE (*)    Protein, ur 100 (*)    Nitrite POSITIVE (*)    Leukocytes, UA TRACE (*)    All other components within normal limits  URINE CULTURE    EKG None  Radiology No results found.  Procedures Procedures (including critical care time)  Medications Ordered in UC Medications - No data to display  Initial Impression / Assessment and Plan / UC Course  I have reviewed the triage vital signs and the nursing notes.  Pertinent labs & imaging results that were available during my care of the patient were reviewed by me and considered in my medical decision making (see chart for details).     Discussed that she may have early pyelonephritis.  I am not offering her shot of Rocephin because her last 2 urinary tract infections showed E. coli  that resistance to cephalosporins.  We will put her on Septra, and I am giving her Zofran for the nausea.  She is also given a limited number of hydrocodone for pain.  She is cautioned not to drive. Final Clinical Impressions(s) / UC Diagnoses   Final diagnoses:  Pyelonephritis     Discharge Instructions     Push fluids Take the norco for pain Caution drowsiness and dizziness Take all of the antibiotic Follow up with Dr Dennard Schaumann in 2 weeks   ED Prescriptions    Medication Sig Dispense Auth. Provider   sulfamethoxazole-trimethoprim (BACTRIM DS,SEPTRA DS) 800-160 MG tablet Take 1 tablet by mouth 2 (two) times daily for 7 days. 20 tablet Raylene Everts, MD   ondansetron (ZOFRAN) 4 MG tablet  (Status: Discontinued) Take 1 tablet (4 mg total) by mouth every 8 (eight) hours as needed for nausea or vomiting. 12 tablet Raylene Everts, MD   HYDROcodone-acetaminophen (NORCO/VICODIN) 5-325 MG tablet Take 2 tablets by mouth every 4 (four) hours as needed. 10 tablet Raylene Everts, MD   ondansetron (ZOFRAN) 4 MG tablet Take 1 tablet (4 mg total) by mouth every 8 (eight) hours as needed for nausea or vomiting. 12 tablet Raylene Everts, MD     Controlled Substance Prescriptions [place] Controlled Substance Registry consulted? Yes, I have consulted the [place] Controlled Substances Registry for this patient, and feel the risk/benefit ratio today is favorable for proceeding with this prescription for a controlled substance.   Raylene Everts, MD 01/31/18 2228

## 2018-01-31 NOTE — ED Triage Notes (Signed)
Patient presents to [facility] for possible UTI, pt complains of lower back pain, urinary frequency, burning, and odor x5 days

## 2018-01-31 NOTE — Discharge Instructions (Signed)
Push fluids Take the norco for pain Caution drowsiness and dizziness Take all of the antibiotic Follow up with Dr Dennard Schaumann in 2 weeks

## 2018-02-02 LAB — URINE CULTURE

## 2018-02-23 ENCOUNTER — Ambulatory Visit (INDEPENDENT_AMBULATORY_CARE_PROVIDER_SITE_OTHER): Payer: Medicaid Other | Admitting: Family Medicine

## 2018-02-23 ENCOUNTER — Encounter: Payer: Self-pay | Admitting: Family Medicine

## 2018-02-23 VITALS — BP 150/84 | HR 82 | Temp 98.6°F | Resp 16 | Ht 62.5 in | Wt 146.0 lb

## 2018-02-23 DIAGNOSIS — M810 Age-related osteoporosis without current pathological fracture: Secondary | ICD-10-CM

## 2018-02-23 DIAGNOSIS — R300 Dysuria: Secondary | ICD-10-CM

## 2018-02-23 DIAGNOSIS — D508 Other iron deficiency anemias: Secondary | ICD-10-CM

## 2018-02-23 DIAGNOSIS — E559 Vitamin D deficiency, unspecified: Secondary | ICD-10-CM

## 2018-02-23 DIAGNOSIS — I10 Essential (primary) hypertension: Secondary | ICD-10-CM

## 2018-02-23 DIAGNOSIS — Z0000 Encounter for general adult medical examination without abnormal findings: Secondary | ICD-10-CM | POA: Diagnosis not present

## 2018-02-23 DIAGNOSIS — M48062 Spinal stenosis, lumbar region with neurogenic claudication: Secondary | ICD-10-CM

## 2018-02-23 DIAGNOSIS — E7800 Pure hypercholesterolemia, unspecified: Secondary | ICD-10-CM | POA: Diagnosis not present

## 2018-02-23 LAB — URINALYSIS, ROUTINE W REFLEX MICROSCOPIC
BILIRUBIN URINE: NEGATIVE
GLUCOSE, UA: NEGATIVE
Ketones, ur: NEGATIVE
Nitrite: POSITIVE — AB
Specific Gravity, Urine: 1.02 (ref 1.001–1.03)
pH: 6 (ref 5.0–8.0)

## 2018-02-23 LAB — MICROSCOPIC MESSAGE

## 2018-02-23 MED ORDER — ALENDRONATE SODIUM 70 MG PO TABS: 70.0000 mg | ORAL_TABLET | ORAL | 11 refills | Status: DC

## 2018-02-23 MED ORDER — SULFAMETHOXAZOLE-TRIMETHOPRIM 800-160 MG PO TABS: 1.0000 | ORAL_TABLET | Freq: Two times a day (BID) | ORAL | 0 refills | Status: DC

## 2018-02-23 MED ORDER — HYDROCHLOROTHIAZIDE 25 MG PO TABS: 25.0000 mg | ORAL_TABLET | Freq: Every day | ORAL | 3 refills | Status: DC

## 2018-02-23 NOTE — Progress Notes (Signed)
Subjective:    Patient ID: Heather Gillespie, female    DOB: April 21, 1961, 57 y.o.   MRN: [id number]  HPI  Patient is here today for complete physical exam.  Her last mammogram was performed in October 2018 and is due again this October.  She had her last colonoscopy in 2017 and this is up-to-date.  The last Pap smear I have on record is March 2017 and this is up-to-date until next year.  She had a bone density test performed in October 2018 which revealed osteoporosis with a T score of -3.  She is supposed to be taking Fosamax 70 mg weekly in addition to calcium and vitamin D.  However the patient has not been taking Fosamax.  She is not sure why.  She originally took it but the medicine has not been refilled in quite some time.  Her blood pressure is elevated today at 150/84.  She also reports 5 days of dysuria, urgency, frequency, hesitancy, and burning lower abdominal pain.  Immunization records are listed below: Immunization History  Administered Date(s) Administered  . Td 02/13/2006  . Tdap 09/22/2012     Past Medical History:  Diagnosis Date  . Abdominal pain   . Allergy   . GERD (gastroesophageal reflux disease)   . Glaucoma   . Hemorrhoids   . Hiatal hernia   . IBS (irritable bowel syndrome)   . N&V (nausea and vomiting)   . Osteoporosis   . Rectal bleeding   . Rectal ulcer   . Sinus problem   . TMJ (dislocation of temporomandibular joint)   . Vitamin D deficiency    Past Surgical History:  Procedure Laterality Date  . ANAL FISSURE REPAIR    . CHOLECYSTECTOMY  1984  . COLONOSCOPY W/ POLYPECTOMY     colon ulcers  . EYE SURGERY    . HEMI-MICRODISCECTOMY LUMBAR LAMINECTOMY LEVEL 1 Left 05/26/2013   Procedure: HEMI-MICRODISCECTOMY LUMBAR LAMINECTOMY L4 - L5 ON THE LEFT LEVEL 1;  Surgeon: Tobi Bastos, MD;  Location: [facility];  Service: Orthopedics;  Laterality: Left;  . HEMORROIDECTOMY     Current Outpatient Medications on File Prior to Visit  Medication Sig Dispense  Refill  . alendronate (FOSAMAX) 70 MG tablet Take 1 tablet (70 mg total) by mouth every 7 (seven) days. Take with a full glass of water on an empty stomach. 4 tablet 11  . atorvastatin (LIPITOR) 20 MG tablet TAKE 1 TABLET BY MOUTH EVERY EVENING. 90 tablet 3  . dexlansoprazole (DEXILANT) 60 MG capsule Take 60 mg by mouth daily.    . Ergocalciferol (VITAMIN D2) 2000 units TABS Take 1 tablet by mouth daily.    . fexofenadine (ALLEGRA) 180 MG tablet Take 1 tablet (180 mg total) by mouth daily. 30 tablet 11  . fluticasone (FLONASE) 50 MCG/ACT nasal spray PLACE 2 SPRAYS INTO EACH NOSTRIL ONCE DAILY 48 g 3  . gabapentin (NEURONTIN) 300 MG capsule TAKE ONE CAPSULE BY MOUTH 3 TIMES A DAY 90 capsule 5  . losartan (COZAAR) 100 MG tablet TAKE 1 TABLET EVERY DAY 90 tablet 2  . naproxen (NAPROSYN) 375 MG tablet Take 1 tablet (375 mg total) by mouth 2 (two) times daily. 20 tablet 0  . ondansetron (ZOFRAN) 4 MG tablet Take 1 tablet (4 mg total) by mouth every 8 (eight) hours as needed for nausea or vomiting. 12 tablet 0  . PATADAY 0.2 % SOLN Apply 1 drop to eye daily. 1 Bottle 11  . phenazopyridine (PYRIDIUM) 200  MG tablet Take 1 tablet (200 mg total) by mouth 3 (three) times daily as needed for pain. 15 tablet 0   No current facility-administered medications on file prior to visit.    Allergies  Allergen Reactions  . Hydroxyzine Pamoate Rash   Social History   Socioeconomic History  . Marital status: Single    Spouse name: Not on file  . Number of children: Not on file  . Years of education: Not on file  . Highest education level: Not on file  Occupational History  . Not on file  Social Needs  . Financial resource strain: Not on file  . Food insecurity:    Worry: Not on file    Inability: Not on file  . Transportation needs:    Medical: Not on file    Non-medical: Not on file  Tobacco Use  . Smoking status: Never Smoker  . Smokeless tobacco: Never Used  Substance and Sexual Activity  .  Alcohol use: No  . Drug use: No  . Sexual activity: Not Currently  Lifestyle  . Physical activity:    Days per week: Not on file    Minutes per session: Not on file  . Stress: Not on file  Relationships  . Social connections:    Talks on phone: Not on file    Gets together: Not on file    Attends religious service: Not on file    Active member of club or organization: Not on file    Attends meetings of clubs or organizations: Not on file    Relationship status: Not on file  . Intimate partner violence:    Fear of current or ex partner: Not on file    Emotionally abused: Not on file    Physically abused: Not on file    Forced sexual activity: Not on file  Other Topics Concern  . Not on file  Social History Narrative  . Not on file   Family History  Problem Relation Age of Onset  . Diabetes Mother   . Stroke Father   . Breast cancer Neg Hx       Review of Systems  All other systems reviewed and are negative.      Objective:   Physical Exam  Constitutional: She is oriented to person, place, and time. She appears well-developed and well-nourished. No distress.  HENT:  Head: Normocephalic and atraumatic.  Right Ear: External ear normal.  Left Ear: External ear normal.  Nose: Nose normal.  Mouth/Throat: Oropharynx is clear and moist. No oropharyngeal exudate.  Eyes: Pupils are equal, round, and reactive to light. Conjunctivae and EOM are normal. Right eye exhibits no discharge. Left eye exhibits no discharge. No scleral icterus.  Neck: Normal range of motion. Neck supple. No JVD present. No tracheal deviation present. No thyromegaly present.  Cardiovascular: Normal rate, regular rhythm, normal heart sounds and intact distal pulses. Exam reveals no gallop and no friction rub.  No murmur heard. Pulmonary/Chest: Effort normal and breath sounds normal. No stridor. No respiratory distress. She has no wheezes. She has no rales. She exhibits no tenderness.  Abdominal: Soft.  Bowel sounds are normal. She exhibits no distension and no mass. There is no tenderness. There is no rebound and no guarding.  Musculoskeletal: Normal range of motion. She exhibits no edema or tenderness.  Lymphadenopathy:    She has no cervical adenopathy.  Neurological: She is alert and oriented to person, place, and time. She has normal reflexes. No cranial nerve  deficit. She exhibits normal muscle tone. Coordination normal.  Skin: Skin is warm. No rash noted. She is not diaphoretic. No erythema. No pallor.  Psychiatric: She has a normal mood and affect. Her behavior is normal. Judgment and thought content normal.  Vitals reviewed.         Assessment & Plan:  General medical exam  Osteoporosis, unspecified osteoporosis type, unspecified pathological fracture presence - Plan: VITAMIN D 25 Hydroxy (Vit-D Deficiency, Fractures)  Vitamin D deficiency - Plan: VITAMIN D 25 Hydroxy (Vit-D Deficiency, Fractures)  Spinal stenosis, lumbar region, with neurogenic claudication  Other iron deficiency anemia - Plan: CBC with Differential/Platelet  Benign essential HTN - Plan: COMPLETE METABOLIC PANEL WITH GFR, Lipid panel  Pure hypercholesterolemia - Plan: COMPLETE METABOLIC PANEL WITH GFR, Lipid panel  Patient's physical exam today is relatively normal. Her cancer screening is up-to-date.  I will schedule the patient for a mammogram in October.  Pap smear is due next year.  Colonoscopy is up-to-date.  Immunizations are up-to-date.  I will check a CBC, CMP, fasting lipid panel.  I will start hydrochlorothiazide in addition to her losartan for her hypertension.  I also refilled her Fosamax 70 mg weekly and recommended she take that with 1200 mg a day of calcium and 1000 units a day of vitamin D.  I will also treat her bladder infection with Bactrim double strength tablets 1 p.o. twice daily for 5 days

## 2018-02-24 LAB — LIPID PANEL
Cholesterol: 181 mg/dL (ref <200)
HDL: 65 mg/dL (ref >50)
LDL Cholesterol (Calc): 101 mg/dL — ABNORMAL HIGH
Non-HDL Cholesterol (Calc): 116 mg/dL (ref <130)
Total CHOL/HDL Ratio: 2.8 (calc) (ref <5.0)
Triglycerides: 66 mg/dL (ref <150)

## 2018-02-24 LAB — COMPLETE METABOLIC PANEL WITHOUT GFR
AG Ratio: 1.7 (calc) (ref 1.0–2.5)
ALT: 7 U/L (ref 6–29)
AST: 13 U/L (ref 10–35)
Albumin: 4.5 g/dL (ref 3.6–5.1)
Alkaline phosphatase (APISO): 67 U/L (ref 33–130)
BUN: 10 mg/dL (ref 7–25)
CO2: 23 mmol/L (ref 20–32)
Calcium: 9.6 mg/dL (ref 8.6–10.4)
Chloride: 105 mmol/L (ref 98–110)
Creat: 0.66 mg/dL (ref 0.50–1.05)
GFR, Est African American: 114 mL/min/1.73m2 (ref >=60)
GFR, Est Non African American: 98 mL/min/1.73m2 (ref >=60)
Globulin: 2.7 g/dL (ref 1.9–3.7)
Glucose, Bld: 78 mg/dL (ref 65–99)
Potassium: 3.7 mmol/L (ref 3.5–5.3)
Sodium: 139 mmol/L (ref 135–146)
Total Bilirubin: 0.8 mg/dL (ref 0.2–1.2)
Total Protein: 7.2 g/dL (ref 6.1–8.1)

## 2018-02-24 LAB — CBC WITH DIFFERENTIAL/PLATELET
BASOS ABS: 22 {cells}/uL (ref 0–200)
Basophils Relative: 0.5 %
EOS PCT: 0.5 %
Eosinophils Absolute: 22 cells/uL (ref 15–500)
HEMATOCRIT: 37.7 % (ref 35.0–45.0)
Hemoglobin: 13 g/dL (ref 11.7–15.5)
LYMPHS ABS: 1630 {cells}/uL (ref 850–3900)
MCH: 29.3 pg (ref 27.0–33.0)
MCHC: 34.5 g/dL (ref 32.0–36.0)
MCV: 84.9 fL (ref 80.0–100.0)
MONOS PCT: 6.5 %
MPV: 11.3 fL (ref 7.5–12.5)
NEUTROS ABS: 2348 {cells}/uL (ref 1500–7800)
NEUTROS PCT: 54.6 %
Platelets: 175 10*3/uL (ref 140–400)
RBC: 4.44 10*6/uL (ref 3.80–5.10)
RDW: 13.1 % (ref 11.0–15.0)
Total Lymphocyte: 37.9 %
WBC mixed population: 280 cells/uL (ref 200–950)
WBC: 4.3 10*3/uL (ref 3.8–10.8)

## 2018-02-24 LAB — VITAMIN D 25 HYDROXY (VIT D DEFICIENCY, FRACTURES): VIT D 25 HYDROXY: 32 ng/mL (ref 30–100)

## 2018-05-21 ENCOUNTER — Other Ambulatory Visit: Payer: Self-pay | Admitting: Family Medicine

## 2018-07-01 ENCOUNTER — Ambulatory Visit
Admission: RE | Admit: 2018-07-01 | Discharge: 2018-07-01 | Disposition: A | Discharge status: Home / Self Care | Payer: Medicaid Other | Source: Ambulatory Visit | Attending: Family Medicine | Admitting: Family Medicine

## 2018-07-01 DIAGNOSIS — Z1231 Encounter for screening mammogram for malignant neoplasm of breast: Secondary | ICD-10-CM | POA: Diagnosis not present

## 2018-07-01 DIAGNOSIS — Z0000 Encounter for general adult medical examination without abnormal findings: Secondary | ICD-10-CM

## 2018-07-03 ENCOUNTER — Other Ambulatory Visit: Payer: Self-pay | Admitting: Family Medicine

## 2018-07-14 ENCOUNTER — Other Ambulatory Visit: Payer: Self-pay | Admitting: Family Medicine

## 2018-07-31 ENCOUNTER — Telehealth: Payer: Self-pay | Admitting: *Deleted

## 2018-07-31 MED ORDER — FEXOFENADINE HCL 180 MG PO TABS: 180.0000 mg | ORAL_TABLET | Freq: Every day | ORAL | 11 refills | Status: DC

## 2018-07-31 NOTE — Telephone Encounter (Signed)
Received request from pharmacy for [facility] [address].   PA submitted.   Dx: Allergic Rhinitis.   Received immediate determination.   PA [id number] approved 07/31/2018- 08/01/2019.

## 2018-08-28 ENCOUNTER — Other Ambulatory Visit: Payer: Self-pay | Admitting: Family Medicine

## 2018-09-24 ENCOUNTER — Other Ambulatory Visit: Payer: Self-pay | Admitting: Family Medicine

## 2018-09-29 DIAGNOSIS — K219 Gastro-esophageal reflux disease without esophagitis: Secondary | ICD-10-CM | POA: Diagnosis not present

## 2018-11-06 ENCOUNTER — Other Ambulatory Visit: Payer: Self-pay

## 2018-11-06 ENCOUNTER — Encounter (HOSPITAL_COMMUNITY): Payer: Self-pay | Admitting: *Deleted

## 2018-11-06 ENCOUNTER — Ambulatory Visit (HOSPITAL_COMMUNITY)
Admission: EM | Admit: 2018-11-06 | Discharge: 2018-11-06 | Disposition: A | Discharge status: Home / Self Care | Payer: Medicaid Other | Attending: Family Medicine | Admitting: Family Medicine

## 2018-11-06 DIAGNOSIS — H1031 Unspecified acute conjunctivitis, right eye: Secondary | ICD-10-CM | POA: Diagnosis not present

## 2018-11-06 MED ORDER — BUTALBITAL-APAP-CAFFEINE 50-325-40 MG PO TABS: 1.0000 | ORAL_TABLET | Freq: Four times a day (QID) | ORAL | 0 refills | Status: DC | PRN

## 2018-11-06 MED ORDER — TOBRAMYCIN 0.3 % OP SOLN: 1.0000 [drp] | OPHTHALMIC | 0 refills | Status: DC

## 2018-11-06 NOTE — ED Provider Notes (Signed)
[facility]    CSN: [id number] Arrival date & time: 11/06/18  1153     History   Chief Complaint Chief Complaint  Patient presents with  . Eye Problem    HPI CHARLEY LAFRANCE is a 58 y.o. female.   This is a 58 year old woman, established most: Urgent care patient, who complains of right eye getting red and painful yest afternoon, states her head hurts this am and her eye hurts more.  She notes thick white discharge from the eye.     Past Medical History:  Diagnosis Date  . Abdominal pain   . Allergy   . GERD (gastroesophageal reflux disease)   . Glaucoma   . Hemorrhoids   . Hiatal hernia   . IBS (irritable bowel syndrome)   . N&V (nausea and vomiting)   . Osteoporosis   . Rectal bleeding   . Rectal ulcer   . Sinus problem   . TMJ (dislocation of temporomandibular joint)   . Vitamin D deficiency     Patient Active Problem List   Diagnosis Date Noted  . Spinal stenosis, lumbar region, with neurogenic claudication 05/26/2013  . 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 Vitamin D deficiency   . TMJ (dislocation of temporomandibular joint)   . Osteoporosis   . Rectal pain 03/31/2011  . ANEMIA, IRON DEFICIENCY NOS 02/19/2007  . [place] SYNDROME 02/19/2007  . HEMORRHOIDS NOS, W/O COMPLICATIONS 71/02/2693  . RHINITIS, ALLERGIC NOS 02/19/2007  . IBS 02/19/2007  . DIARRHEA, CHRONIC 02/19/2007  . UTI (urinary tract infection) 02/19/2007    Past Surgical History:  Procedure Laterality Date  . ANAL FISSURE REPAIR    . CHOLECYSTECTOMY  1984  . COLONOSCOPY W/ POLYPECTOMY     colon ulcers  . EYE SURGERY    . HEMI-MICRODISCECTOMY LUMBAR LAMINECTOMY LEVEL 1 Left 05/26/2013   Procedure: HEMI-MICRODISCECTOMY LUMBAR LAMINECTOMY L4 - L5 ON THE LEFT LEVEL 1;  Surgeon: Tobi Bastos, MD;  Location: [facility];  Service: Orthopedics;  Laterality: Left;  . HEMORROIDECTOMY      OB History   No obstetric history on  file.      Home Medications    Prior to Admission medications   Medication Sig Start Date End Date Taking? Authorizing Provider  alendronate (FOSAMAX) 70 MG tablet Take 1 tablet (70 mg total) by mouth every 7 (seven) days. Take with a full glass of water on an empty stomach. 02/23/18   Susy Frizzle, MD  butalbital-acetaminophen-caffeine (FIORICET, ESGIC) [phone] MG tablet Take 1-2 tablets by mouth every 6 (six) hours as needed for headache. 11/06/18 11/06/19  Robyn Haber, MD  dexlansoprazole (DEXILANT) 60 MG capsule Take 60 mg by mouth daily.    [provider]  Ergocalciferol (VITAMIN D2) 2000 units TABS Take 1 tablet by mouth daily.    [provider]  fexofenadine (ALLEGRA) 180 MG tablet Take 1 tablet (180 mg total) by mouth daily. 07/31/18   Susy Frizzle, MD  fluticasone (FLONASE) 50 MCG/ACT nasal spray PLACE 2 SPRAYS INTO EACH NOSTRIL ONCE DAILY 07/05/18   Susy Frizzle, MD  gabapentin (NEURONTIN) 300 MG capsule TAKE ONE CAPSULE BY MOUTH 3 TIMES A DAY 05/21/18   Susy Frizzle, MD  hydrochlorothiazide (HYDRODIURIL) 25 MG tablet Take 1 tablet (25 mg total) by mouth daily. 02/23/18   Susy Frizzle, MD  losartan (COZAAR) 100 MG tablet TAKE 1 TABLET EVERY DAY 09/24/18   Susy Frizzle, MD  tobramycin (  TOBREX) 0.3 % ophthalmic solution Place 1 drop into the right eye every 4 (four) hours. 11/06/18   Robyn Haber, MD    Family History Family History  Problem Relation Age of Onset  . Diabetes Mother   . Stroke Father   . Breast cancer Neg Hx     Social History Social History   Tobacco Use  . Smoking status: Never Smoker  . Smokeless tobacco: Never Used  Substance Use Topics  . Alcohol use: No  . Drug use: No     Allergies   Hydroxyzine pamoate   Review of Systems Review of Systems   Physical Exam Triage Vital Signs ED Triage Vitals  Enc Vitals Group     BP 11/06/18 1222 138/79     Pulse Rate 11/06/18 1222 82     Resp  11/06/18 1222 18     Temp 11/06/18 1222 98.2 F (36.8 C)     Temp Source 11/06/18 1222 Temporal     SpO2 11/06/18 1222 100 %     Weight --      Height --      Head Circumference --      Peak Flow --      Pain Score 11/06/18 1223 10     Pain Loc --      Pain Edu? --      Excl. in [place]? --    No data found.  Updated Vital Signs BP 138/79 (BP Location: Right Arm)   Pulse 82   Temp 98.2 F (36.8 C) (Temporal)   Resp 18   SpO2 100%    Physical Exam Vitals signs and nursing note reviewed.  Constitutional:      Appearance: Normal appearance. She is normal weight.  HENT:     Head: Normocephalic.     Right Ear: External ear normal.     Left Ear: External ear normal.     Nose: Nose normal.     Mouth/Throat:     Mouth: Mucous membranes are moist.     Pharynx: Oropharynx is clear.  Eyes:     Comments: Injected right conjunctiva with discharge  Fundus appears normal  Cardiovascular:     Rate and Rhythm: Normal rate.     Pulses: Normal pulses.  Pulmonary:     Effort: Pulmonary effort is normal.  Musculoskeletal: Normal range of motion.  Skin:    General: Skin is warm and dry.  Neurological:     General: No focal deficit present.     Mental Status: She is alert.  Psychiatric:        Mood and Affect: Mood normal.        Thought Content: Thought content normal.        Judgment: Judgment normal.      UC Treatments / Results  Labs (all labs ordered are listed, but only abnormal results are displayed) Labs Reviewed - No data to display  EKG None  Radiology No results found.  Procedures Procedures (including critical care time)  Medications Ordered in UC Medications - No data to display  Initial Impression / Assessment and Plan / UC Course  I have reviewed the triage vital signs and the nursing notes.  Pertinent labs & imaging results that were available during my care of the patient were reviewed by me and considered in my medical decision making (see chart  for details).    Final Clinical Impressions(s) / UC Diagnoses   Final diagnoses:  Acute bacterial conjunctivitis of right eye  Discharge Instructions     Return if eye does not improve in the next day.    ED Prescriptions    Medication Sig Dispense Auth. Provider   tobramycin (TOBREX) 0.3 % ophthalmic solution Place 1 drop into the right eye every 4 (four) hours. 5 mL Robyn Haber, MD   butalbital-acetaminophen-caffeine (FIORICET, ESGIC) [phone] MG tablet Take 1-2 tablets by mouth every 6 (six) hours as needed for headache. 20 tablet Robyn Haber, MD     Controlled Substance Prescriptions [place] Controlled Substance Registry consulted? Not Applicable   Robyn Haber, MD 11/06/18 1254

## 2018-11-06 NOTE — Discharge Instructions (Addendum)
Return if eye does not improve in the next day.

## 2018-11-06 NOTE — ED Triage Notes (Signed)
C/o right eye getting red and painful yest afternoon, states her head hurts this am and her eye hurts more.

## 2018-11-12 ENCOUNTER — Other Ambulatory Visit: Payer: Self-pay | Admitting: *Deleted

## 2018-11-12 MED ORDER — GABAPENTIN 300 MG PO CAPS: 300.0000 mg | ORAL_CAPSULE | Freq: Three times a day (TID) | ORAL | 5 refills | Status: DC

## 2018-11-28 ENCOUNTER — Other Ambulatory Visit: Payer: Self-pay

## 2018-11-28 ENCOUNTER — Encounter (HOSPITAL_COMMUNITY): Payer: Self-pay | Admitting: Emergency Medicine

## 2018-11-28 ENCOUNTER — Ambulatory Visit (HOSPITAL_COMMUNITY)
Admission: EM | Admit: 2018-11-28 | Discharge: 2018-11-28 | Disposition: A | Discharge status: Home / Self Care | Payer: Medicaid Other | Attending: Family Medicine | Admitting: Family Medicine

## 2018-11-28 DIAGNOSIS — N3090 Cystitis, unspecified without hematuria: Secondary | ICD-10-CM

## 2018-11-28 LAB — POCT URINALYSIS DIP (DEVICE)
Bilirubin Urine: NEGATIVE
Glucose, UA: NEGATIVE mg/dL
HGB URINE DIPSTICK: NEGATIVE
KETONES UR: NEGATIVE mg/dL
Nitrite: NEGATIVE
PROTEIN: NEGATIVE mg/dL
SPECIFIC GRAVITY, URINE: 1.025 (ref 1.005–1.030)
UROBILINOGEN UA: 0.2 mg/dL (ref 0.0–1.0)
pH: 5.5 (ref 5.0–8.0)

## 2018-11-28 MED ORDER — SULFAMETHOXAZOLE-TRIMETHOPRIM 800-160 MG PO TABS: 1.0000 | ORAL_TABLET | Freq: Two times a day (BID) | ORAL | 0 refills | Status: AC

## 2018-11-28 MED ORDER — PHENAZOPYRIDINE HCL 200 MG PO TABS: 200.0000 mg | ORAL_TABLET | Freq: Three times a day (TID) | ORAL | 0 refills | Status: DC

## 2018-11-28 NOTE — Discharge Instructions (Signed)
Your urine was positive for an urinary tract infection. Start bactrim as directed. Pyridium for burning sensation. Keep hydrated, urine should be clear to pale yellow in color. Monitor for any worsening of symptoms, fever, worsening abdominal pain, nausea/vomiting, flank pain, follow up for reevaluation.

## 2018-11-28 NOTE — ED Triage Notes (Signed)
Pt presents to [facility] for assessment of burning with urination x 5 days, lower mid abdominal pain and bilateral back pain.

## 2018-11-28 NOTE — ED Provider Notes (Signed)
[place]    CSN: [id number] Arrival date & time: 11/28/18  1620     History   Chief Complaint Chief Complaint  Patient presents with   Dysuria    HPI Heather Gillespie is a 58 y.o. female.   58 year old female comes in for 5 day history of dysuria, urinary hesitancy, urgency. Denies hematuria. She has also had suprapubic pressure, with pain to the location during urination. States has bilateral back pain. Denies nausea, vomiting. States her temp has been "up and down", has not taken any antipyretic. Has been increasing water intake without relief.      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 05/26/2013   Vitamin D deficiency    TMJ (dislocation of temporomandibular joint)    Osteoporosis    Rectal pain 03/31/2011   ANEMIA, IRON DEFICIENCY NOS 02/19/2007   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   HEMORRHOIDS NOS, W/O COMPLICATIONS 83/15/1761   RHINITIS, ALLERGIC NOS 02/19/2007   IBS 02/19/2007   DIARRHEA, CHRONIC 02/19/2007   UTI (urinary tract infection) 02/19/2007    Past Surgical History:  Procedure Laterality Date   ANAL FISSURE REPAIR     CHOLECYSTECTOMY  1984   COLONOSCOPY W/ POLYPECTOMY     colon ulcers   EYE SURGERY     HEMI-MICRODISCECTOMY LUMBAR LAMINECTOMY LEVEL 1 Left 05/26/2013   Procedure: HEMI-MICRODISCECTOMY LUMBAR LAMINECTOMY L4 - L5 ON THE LEFT LEVEL 1;  Surgeon: Tobi Bastos, MD;   Location: [facility];  Service: Orthopedics;  Laterality: Left;   HEMORROIDECTOMY      OB History   No obstetric history on file.      Home Medications    Prior to Admission medications   Medication Sig Start Date End Date Taking? Authorizing Provider  alendronate (FOSAMAX) 70 MG tablet Take 1 tablet (70 mg total) by mouth every 7 (seven) days. Take with a full glass of water on an empty stomach. 02/23/18   Susy Frizzle, MD  butalbital-acetaminophen-caffeine (FIORICET, ESGIC) [phone] MG tablet Take 1-2 tablets by mouth every 6 (six) hours as needed for headache. 11/06/18 11/06/19  Robyn Haber, MD  dexlansoprazole (DEXILANT) 60 MG capsule Take 60 mg by mouth daily.    [provider]  Ergocalciferol (VITAMIN D2) 2000 units TABS Take 1 tablet by mouth daily.    [provider]  fexofenadine (ALLEGRA) 180 MG tablet Take 1 tablet (180 mg total) by mouth daily. 07/31/18   Susy Frizzle, MD  fluticasone (FLONASE) 50 MCG/ACT nasal spray PLACE 2 SPRAYS INTO EACH NOSTRIL ONCE DAILY 07/05/18   Susy Frizzle, MD  gabapentin (NEURONTIN) 300 MG capsule Take 1 capsule (300 mg total) by mouth 3 (three) times daily. 11/12/18   Susy Frizzle, MD  hydrochlorothiazide (HYDRODIURIL) 25 MG tablet Take 1 tablet (25 mg total) by mouth daily. 02/23/18   Susy Frizzle, MD  losartan (COZAAR) 100 MG tablet TAKE 1 TABLET EVERY DAY 09/24/18   Susy Frizzle, MD  phenazopyridine (PYRIDIUM) 200 MG tablet Take 1 tablet (200 mg total) by mouth 3 (three) times daily. 11/28/18   Tasia Catchings, Poonam Woehrle V, PA-C  sulfamethoxazole-trimethoprim (BACTRIM DS,SEPTRA DS) 800-160 MG tablet Take 1 tablet by mouth 2 (two) times daily for 7 days. 11/28/18 12/05/18  Tasia Catchings, Tanuj Mullens V, PA-C  tobramycin (TOBREX) 0.3 % ophthalmic solution Place 1 drop into the right eye every 4 (four) hours. 11/06/18   Robyn Haber, MD    Family History Family History  Problem Relation Age of Onset   Diabetes Mother    Stroke Father     Breast cancer Neg Hx     Social History Social History   Tobacco Use   Smoking status: Never Smoker   Smokeless tobacco: Never Used  Substance Use Topics   Alcohol use: No   Drug use: No     Allergies   Hydroxyzine pamoate   Review of Systems Review of Systems  Reason unable to perform ROS: See HPI as above.     Physical Exam Triage Vital Signs ED Triage Vitals  Enc Vitals Group     BP 11/28/18 1630 (!) 145/84     Pulse Rate 11/28/18 1630 81     Resp 11/28/18 1630 16     Temp 11/28/18 1630 98.3 F (36.8 C)     Temp Source 11/28/18 1630 Oral     SpO2 11/28/18 1630 98 %     Weight --      Height --      Head Circumference --      Peak Flow --      Pain Score 11/28/18 1632 10     Pain Loc --      Pain Edu? --      Excl. in [place]? --    No data found.  Updated Vital Signs BP (!) 145/84 (BP Location: Left Arm)    Pulse 81    Temp 98.3 F (36.8 C) (Oral)    Resp 16    SpO2 98%   Physical Exam Constitutional:      General: She is not in acute distress.    Appearance: She is well-developed. She is not ill-appearing, toxic-appearing or diaphoretic.  HENT:     Head: Normocephalic and atraumatic.  Eyes:     Conjunctiva/sclera: Conjunctivae normal.     Pupils: Pupils are equal, round, and reactive to light.  Cardiovascular:     Rate and Rhythm: Normal rate and regular rhythm.     Heart sounds: Normal heart sounds. No murmur. No friction rub. No gallop.   Pulmonary:     Effort: Pulmonary effort is normal.     Breath sounds: Normal breath sounds. No wheezing or rales.  Abdominal:     General: Bowel sounds are normal.     Palpations: Abdomen is soft.     Tenderness: There is no abdominal tenderness. There is no right CVA tenderness, left CVA tenderness, guarding or rebound.  Musculoskeletal:     Comments: No tenderness to palpation of spinous processes. Mild tenderness to palpation of left lower back. No obvious CVA tenderness. Full ROM of back.   Skin:     General: Skin is warm and dry.  Neurological:     Mental Status: She is alert and oriented to person, place, and time.  Psychiatric:        Behavior: Behavior normal.  Judgment: Judgment normal.      UC Treatments / Results  Labs (all labs ordered are listed, but only abnormal results are displayed) Labs Reviewed  POCT URINALYSIS DIP (DEVICE) - Abnormal; Notable for the following components:      Result Value   Leukocytes,Ua SMALL (*)    All other components within normal limits  URINE CULTURE    EKG None  Radiology No results found.  Procedures Procedures (including critical care time)  Medications Ordered in UC Medications - No data to display  Initial Impression / Assessment and Plan / UC Course  I have reviewed the triage vital signs and the nursing notes.  Pertinent labs & imaging results that were available during my care of the patient were reviewed by me and considered in my medical decision making (see chart for details).    Urine with small leukocytes.  Patient with history of frequent UTIs, urine culture in the past showed resistance to cephalosporins.  Will start Bactrim as directed.  Urine culture sent.  Push fluids.  Return precautions given.  Patient expresses understanding and agrees to plan.  Final Clinical Impressions(s) / UC Diagnoses   Final diagnoses:  Cystitis    ED Prescriptions    Medication Sig Dispense Auth. Provider   sulfamethoxazole-trimethoprim (BACTRIM DS,SEPTRA DS) 800-160 MG tablet Take 1 tablet by mouth 2 (two) times daily for 7 days. 14 tablet Dwane Andres V, PA-C   phenazopyridine (PYRIDIUM) 200 MG tablet Take 1 tablet (200 mg total) by mouth 3 (three) times daily. 6 tablet Tobin Chad, [place] 11/28/18 1703

## 2018-11-29 LAB — URINE CULTURE

## 2018-12-30 ENCOUNTER — Other Ambulatory Visit: Payer: Self-pay | Admitting: *Deleted

## 2018-12-30 MED ORDER — OLOPATADINE HCL 0.7 % OP SOLN: 1.0000 | Freq: Every day | OPHTHALMIC | 3 refills | Status: DC

## 2019-01-15 ENCOUNTER — Other Ambulatory Visit: Payer: Self-pay

## 2019-01-15 ENCOUNTER — Ambulatory Visit (INDEPENDENT_AMBULATORY_CARE_PROVIDER_SITE_OTHER): Payer: Medicaid Other

## 2019-01-15 ENCOUNTER — Ambulatory Visit (HOSPITAL_COMMUNITY)
Admission: EM | Admit: 2019-01-15 | Discharge: 2019-01-15 | Disposition: A | Discharge status: Home / Self Care | Payer: Medicaid Other | Attending: Family Medicine | Admitting: Family Medicine

## 2019-01-15 ENCOUNTER — Encounter (HOSPITAL_COMMUNITY): Payer: Self-pay

## 2019-01-15 DIAGNOSIS — R319 Hematuria, unspecified: Secondary | ICD-10-CM | POA: Diagnosis not present

## 2019-01-15 DIAGNOSIS — R109 Unspecified abdominal pain: Secondary | ICD-10-CM

## 2019-01-15 DIAGNOSIS — N3090 Cystitis, unspecified without hematuria: Secondary | ICD-10-CM

## 2019-01-15 LAB — POCT URINALYSIS DIP (DEVICE)
Bilirubin Urine: NEGATIVE
Glucose, UA: NEGATIVE mg/dL
Ketones, ur: NEGATIVE mg/dL
Leukocytes,Ua: NEGATIVE
Nitrite: NEGATIVE
Protein, ur: 100 mg/dL — AB
Specific Gravity, Urine: 1.025 (ref 1.005–1.030)
Urobilinogen, UA: 0.2 mg/dL (ref 0.0–1.0)
pH: 5.5 (ref 5.0–8.0)

## 2019-01-15 MED ORDER — PHENAZOPYRIDINE HCL 200 MG PO TABS: 200.0000 mg | ORAL_TABLET | Freq: Three times a day (TID) | ORAL | 0 refills | Status: DC

## 2019-01-15 MED ORDER — CEPHALEXIN 500 MG PO CAPS: 500.0000 mg | ORAL_CAPSULE | Freq: Two times a day (BID) | ORAL | 0 refills | Status: AC

## 2019-01-15 NOTE — ED Provider Notes (Signed)
[facility]    CSN: [id number] Arrival date & time: 01/15/19  1357     History   Chief Complaint Chief Complaint  Patient presents with  . Urinary Tract Infection    HPI Heather Gillespie is a 58 y.o. female.   HPI  Presents today with a compliant of UTI symptoms with associated nausea, abdominal pain, and fatigue. History of recurrent UTI infections. Previous culture positive for E Coli. Reports seeing a urologist in the past several years ago who advised that her symptoms are simply cystitis related. She has not experienced fever or chills. Endorses severe lower back pain and dysuria are her most worrisome symptoms. Last treatment for cystitis 11/28/18 with bactrim. Symptoms resolved with treatment.  Past Medical History:  Diagnosis Date  . Abdominal pain   . Allergy   . GERD (gastroesophageal reflux disease)   . Glaucoma   . Hemorrhoids   . Hiatal hernia   . IBS (irritable bowel syndrome)   . N&V (nausea and vomiting)   . Osteoporosis   . Rectal bleeding   . Rectal ulcer   . Sinus problem   . TMJ (dislocation of temporomandibular joint)   . Vitamin D deficiency     Patient Active Problem List   Diagnosis Date Noted  . Spinal stenosis, lumbar region, with neurogenic claudication 05/26/2013  . 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 Vitamin D deficiency   . TMJ (dislocation of temporomandibular joint)   . Osteoporosis   . Rectal pain 03/31/2011  . ANEMIA, IRON DEFICIENCY NOS 02/19/2007  . [place] SYNDROME 02/19/2007  . HEMORRHOIDS NOS, W/O COMPLICATIONS 27/25/3664  . RHINITIS, ALLERGIC NOS 02/19/2007  . IBS 02/19/2007  . DIARRHEA, CHRONIC 02/19/2007  . UTI (urinary tract infection) 02/19/2007    Past Surgical History:  Procedure Laterality Date  . ANAL FISSURE REPAIR    . CHOLECYSTECTOMY  1984  . COLONOSCOPY W/ POLYPECTOMY     colon ulcers  . EYE SURGERY    . HEMI-MICRODISCECTOMY LUMBAR LAMINECTOMY  LEVEL 1 Left 05/26/2013   Procedure: HEMI-MICRODISCECTOMY LUMBAR LAMINECTOMY L4 - L5 ON THE LEFT LEVEL 1;  Surgeon: Tobi Bastos, MD;  Location: [facility];  Service: Orthopedics;  Laterality: Left;  . HEMORROIDECTOMY      OB History   No obstetric history on file.      Home Medications    Prior to Admission medications   Medication Sig Start Date End Date Taking? Authorizing Provider  alendronate (FOSAMAX) 70 MG tablet Take 1 tablet (70 mg total) by mouth every 7 (seven) days. Take with a full glass of water on an empty stomach. 02/23/18   Susy Frizzle, MD  butalbital-acetaminophen-caffeine (FIORICET, ESGIC) [phone] MG tablet Take 1-2 tablets by mouth every 6 (six) hours as needed for headache. 11/06/18 11/06/19  Robyn Haber, MD  dexlansoprazole (DEXILANT) 60 MG capsule Take 60 mg by mouth daily.    [provider]  Ergocalciferol (VITAMIN D2) 2000 units TABS Take 1 tablet by mouth daily.    [provider]  fexofenadine (ALLEGRA) 180 MG tablet Take 1 tablet (180 mg total) by mouth daily. 07/31/18   Susy Frizzle, MD  fluticasone (FLONASE) 50 MCG/ACT nasal spray PLACE 2 SPRAYS INTO EACH NOSTRIL ONCE DAILY 07/05/18   Susy Frizzle, MD  gabapentin (NEURONTIN) 300 MG capsule Take 1 capsule (300 mg total) by mouth 3 (three) times daily. 11/12/18   Susy Frizzle, MD  hydrochlorothiazide (HYDRODIURIL) 25 MG  tablet Take 1 tablet (25 mg total) by mouth daily. 02/23/18   Susy Frizzle, MD  losartan (COZAAR) 100 MG tablet TAKE 1 TABLET EVERY DAY 09/24/18   Susy Frizzle, MD  Olopatadine HCl (PAZEO) 0.7 % SOLN Apply 1 each to eye daily. Instill 1 gtts to OU QD. 12/30/18   Susy Frizzle, MD  phenazopyridine (PYRIDIUM) 200 MG tablet Take 1 tablet (200 mg total) by mouth 3 (three) times daily. 11/28/18   Tasia Catchings, Amy V, PA-C  tobramycin (TOBREX) 0.3 % ophthalmic solution Place 1 drop into the right eye every 4 (four) hours. 11/06/18   Robyn Haber, MD     Family History Family History  Problem Relation Age of Onset  . Diabetes Mother   . Stroke Father   . Breast cancer Neg Hx     Social History Social History   Tobacco Use  . Smoking status: Never Smoker  . Smokeless tobacco: Never Used  Substance Use Topics  . Alcohol use: No  . Drug use: No     Allergies   Hydroxyzine pamoate   Review of Systems Review of Systems Pertinent negatives listed in HPI Physical Exam Triage Vital Signs ED Triage Vitals  Enc Vitals Group     BP 01/15/19 1432 (!) 149/78     Pulse Rate 01/15/19 1432 77     Resp 01/15/19 1432 17     Temp 01/15/19 1432 98.3 F (36.8 C)     Temp Source 01/15/19 1432 Oral     SpO2 01/15/19 1432 100 %     Weight --      Height --      Head Circumference --      Peak Flow --      Pain Score 01/15/19 1435 9     Pain Loc --      Pain Edu? --      Excl. in [place]? --    No data found.  Updated Vital Signs BP (!) 149/78 (BP Location: Right Arm)   Pulse 77   Temp 98.3 F (36.8 C) (Oral)   Resp 17   SpO2 100%   Visual Acuity Right Eye Distance:   Left Eye Distance:   Bilateral Distance:    Right Eye Near:   Left Eye Near:    Bilateral Near:     Physical Exam General appearance: alert, well developed, well nourished, cooperative and in no distress Head: Normocephalic, without obvious abnormality, atraumatic Respiratory: Respirations even and unlabored, normal respiratory rate Heart: rate and rhythm normal. No gallop or murmurs noted on exam  Abdomen: BS +, no distention, no rebound tenderness, +CVA tenderness, no mass Extremities: No gross deformities Skin: Skin color, texture, turgor normal. No rashes seen  Psych: Appropriate mood and affect. Neurologic: Mental status: Alert, oriented to person, place, and time, thought content appropriate. UC Treatments / Results  Labs (all labs ordered are listed, but only abnormal results are displayed) Labs Reviewed  POCT URINALYSIS DIP (DEVICE) - Abnormal;  Notable for the following components:      Result Value   Hgb urine dipstick LARGE (*)    Protein, ur 100 (*)    All other components within normal limits    EKG None  Radiology Dg Abd 1 View  Result Date: 01/15/2019 CLINICAL DATA:  Abdominal pain.  Evaluate for renal calculi EXAM: ABDOMEN - 1 VIEW COMPARISON:  11/30/2009 FINDINGS: No visible urolithiasis. Pelvic calcifications similar to 2011 and most consistent with phleboliths. Normal bowel gas  pattern. Cholecystectomy clips. IMPRESSION: No visible urolithiasis or other cause of acute abdominal pain. Electronically Signed   By: Monte Fantasia M.D.   On: 01/15/2019 16:06    Procedures Procedures (including critical care time)  Medications Ordered in UC Medications - No data to display  Initial Impression / Assessment and Plan / UC Course  I have reviewed the triage vital signs and the nursing notes.  Pertinent labs & imaging results that were available during my care of the patient were reviewed by me and considered in my medical decision making (see chart for details).     Concern for nephrolithiasis given large RBC present with UA. KUB negative unremarkable. No Leukocytes or nitrates present on UA. Urine culture pending. Will treat empirically with broad-spectrum antibiotic therapy given symptoms and history of recurrent UTI. Recommended to patient a follow-up with urology would be reasonable given the frequency of recurrent UTI. Final Clinical Impressions(s) / UC Diagnoses   Final diagnoses:  Hematuria, unspecified type  Cystitis   Discharge Instructions   None    ED Prescriptions    Medication Sig Dispense Auth. Provider   phenazopyridine (PYRIDIUM) 200 MG tablet Take 1 tablet (200 mg total) by mouth 3 (three) times daily. 9 tablet Scot Jun, FNP   cephALEXin (KEFLEX) 500 MG capsule Take 1 capsule (500 mg total) by mouth 2 (two) times daily for 10 days. 20 capsule Scot Jun, FNP     Controlled  Substance Prescriptions [place] Controlled Substance Registry consulted? Not Applicable   Scot Jun, FNP 01/17/19 2019

## 2019-01-15 NOTE — ED Triage Notes (Signed)
Pt presents with urinary tract symptoms; flank pain, urinary urgency, painful when urinating, blood in urine,nausea, abdominal pain,  and fatigue.

## 2019-01-25 DIAGNOSIS — I10 Essential (primary) hypertension: Secondary | ICD-10-CM | POA: Diagnosis not present

## 2019-01-25 DIAGNOSIS — H2512 Age-related nuclear cataract, left eye: Secondary | ICD-10-CM | POA: Diagnosis not present

## 2019-01-25 DIAGNOSIS — H18413 Arcus senilis, bilateral: Secondary | ICD-10-CM | POA: Diagnosis not present

## 2019-01-25 DIAGNOSIS — H2513 Age-related nuclear cataract, bilateral: Secondary | ICD-10-CM | POA: Diagnosis not present

## 2019-01-25 DIAGNOSIS — H25013 Cortical age-related cataract, bilateral: Secondary | ICD-10-CM | POA: Diagnosis not present

## 2019-01-31 DIAGNOSIS — H2512 Age-related nuclear cataract, left eye: Secondary | ICD-10-CM | POA: Diagnosis not present

## 2019-02-01 DIAGNOSIS — H2511 Age-related nuclear cataract, right eye: Secondary | ICD-10-CM | POA: Diagnosis not present

## 2019-02-16 ENCOUNTER — Other Ambulatory Visit: Payer: Self-pay | Admitting: Family Medicine

## 2019-02-21 DIAGNOSIS — H2511 Age-related nuclear cataract, right eye: Secondary | ICD-10-CM | POA: Diagnosis not present

## 2019-04-20 ENCOUNTER — Other Ambulatory Visit: Payer: Self-pay | Admitting: Family Medicine

## 2019-04-20 DIAGNOSIS — M810 Age-related osteoporosis without current pathological fracture: Secondary | ICD-10-CM

## 2019-05-07 ENCOUNTER — Other Ambulatory Visit: Payer: Self-pay | Admitting: Family Medicine

## 2019-05-08 ENCOUNTER — Encounter (HOSPITAL_COMMUNITY): Payer: Self-pay

## 2019-05-08 ENCOUNTER — Other Ambulatory Visit: Payer: Self-pay

## 2019-05-08 ENCOUNTER — Ambulatory Visit (HOSPITAL_COMMUNITY)
Admission: EM | Admit: 2019-05-08 | Discharge: 2019-05-08 | Disposition: A | Discharge status: Home / Self Care | Payer: Medicaid Other | Attending: Family Medicine | Admitting: Family Medicine

## 2019-05-08 DIAGNOSIS — N12 Tubulo-interstitial nephritis, not specified as acute or chronic: Secondary | ICD-10-CM

## 2019-05-08 LAB — POCT URINALYSIS DIP (DEVICE)
Bilirubin Urine: NEGATIVE
Glucose, UA: NEGATIVE mg/dL
Ketones, ur: NEGATIVE mg/dL
Nitrite: NEGATIVE
Protein, ur: NEGATIVE mg/dL
Specific Gravity, Urine: 1.025 (ref 1.005–1.030)
Urobilinogen, UA: 0.2 mg/dL (ref 0.0–1.0)
pH: 5.5 (ref 5.0–8.0)

## 2019-05-08 MED ORDER — CEPHALEXIN 500 MG PO CAPS: 500.0000 mg | ORAL_CAPSULE | Freq: Two times a day (BID) | ORAL | 0 refills | Status: DC

## 2019-05-08 MED ORDER — PHENAZOPYRIDINE HCL 200 MG PO TABS: 200.0000 mg | ORAL_TABLET | Freq: Three times a day (TID) | ORAL | 0 refills | Status: DC

## 2019-05-08 MED ORDER — FLUCONAZOLE 150 MG PO TABS: 150.0000 mg | ORAL_TABLET | Freq: Every day | ORAL | 0 refills | Status: DC

## 2019-05-08 NOTE — ED Provider Notes (Signed)
[facility]    CSN: [id number] Arrival date & time: 05/08/19  1229      History   Chief Complaint Chief Complaint  Patient presents with  . Urinary Tract Infection    HPI Heather Gillespie is a 58 y.o. female.   58 year old female comes in for 5-day history of urinary symptoms.  She has had dysuria, occasional hematuria, frequency, urgency.  She has had some left-sided back pain that is intermittent.  T-max 100.2, no chills, body aches.  Denies nausea, vomiting.  Denies vaginal discharge, itching.  Denies changes in hygiene product.  Has not taken anything for the symptoms.     Past Medical History:  Diagnosis Date  . Abdominal pain   . Allergy   . GERD (gastroesophageal reflux disease)   . Glaucoma   . Hemorrhoids   . Hiatal hernia   . IBS (irritable bowel syndrome)   . N&V (nausea and vomiting)   . Osteoporosis   . Rectal bleeding   . Rectal ulcer   . Sinus problem   . TMJ (dislocation of temporomandibular joint)   . Vitamin D deficiency     Patient Active Problem List   Diagnosis Date Noted  . Spinal stenosis, lumbar region, with neurogenic claudication 05/26/2013  . 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 Vitamin D deficiency   . TMJ (dislocation of temporomandibular joint)   . Osteoporosis   . Rectal pain 03/31/2011  . ANEMIA, IRON DEFICIENCY NOS 02/19/2007  . [place] SYNDROME 02/19/2007  . HEMORRHOIDS NOS, W/O COMPLICATIONS [id number]  . RHINITIS, ALLERGIC NOS 02/19/2007  . IBS 02/19/2007  . DIARRHEA, CHRONIC 02/19/2007  . UTI (urinary tract infection) 02/19/2007    Past Surgical History:  Procedure Laterality Date  . ANAL FISSURE REPAIR    . CHOLECYSTECTOMY  1984  . COLONOSCOPY W/ POLYPECTOMY     colon ulcers  . EYE SURGERY    . HEMI-MICRODISCECTOMY LUMBAR LAMINECTOMY LEVEL 1 Left 05/26/2013   Procedure: HEMI-MICRODISCECTOMY LUMBAR LAMINECTOMY L4 - L5 ON THE LEFT LEVEL 1;  Surgeon: Tobi Bastos, MD;  Location: [facility];  Service: Orthopedics;  Laterality: Left;  . HEMORROIDECTOMY      OB History   No obstetric history on file.      Home Medications    Prior to Admission medications   Medication Sig Start Date End Date Taking? Authorizing Provider  alendronate (FOSAMAX) 70 MG tablet TAKE 1 TABLET BY MOUTH EVERY 7 DAYS. TAKE WITH A FULL GLASS OF WATER ON AN EMPTY STOMACH. 04/20/19   Susy Frizzle, MD  butalbital-acetaminophen-caffeine (FIORICET, ESGIC) [phone] MG tablet Take 1-2 tablets by mouth every 6 (six) hours as needed for headache. 11/06/18 11/06/19  Robyn Haber, MD  cephALEXin (KEFLEX) 500 MG capsule Take 1 capsule (500 mg total) by mouth 2 (two) times daily. 05/08/19   Tasia Catchings, Finnlee Silvernail V, PA-C  dexlansoprazole (DEXILANT) 60 MG capsule Take 60 mg by mouth daily.    [provider]  Ergocalciferol (VITAMIN D2) 2000 units TABS Take 1 tablet by mouth daily.    [provider]  fexofenadine (ALLEGRA) 180 MG tablet Take 1 tablet (180 mg total) by mouth daily. 07/31/18   Susy Frizzle, MD  fluconazole (DIFLUCAN) 150 MG tablet Take 1 tablet (150 mg total) by mouth daily. Take second dose 72 hours later if symptoms still persists. 05/08/19   Tasia Catchings, Jocilynn Grade V, PA-C  fluticasone (FLONASE) 50 MCG/ACT nasal spray PLACE 2 SPRAYS INTO [facility]  NOSTRIL ONCE DAILY 04/20/19   Susy Frizzle, MD  gabapentin (NEURONTIN) 300 MG capsule Take 1 capsule (300 mg total) by mouth 3 (three) times daily. 11/12/18   Susy Frizzle, MD  hydrochlorothiazide (HYDRODIURIL) 25 MG tablet TAKE 1 TABLET BY MOUTH EVERY DAY 02/16/19   Susy Frizzle, MD  losartan (COZAAR) 100 MG tablet TAKE 1 TABLET EVERY DAY 09/24/18   Susy Frizzle, MD  PATADAY 0.2 % SOLN INSTILL 1 DROP INTO AFFECTED EYE EVERY DAY 04/20/19   Susy Frizzle, MD  PAZEO 0.7 % SOLN INSTILL 1 DROP INTO BOTH EYES EVERY DAY 04/20/19   Susy Frizzle, MD  phenazopyridine (PYRIDIUM) 200 MG tablet Take 1 tablet (200 mg total) by  mouth 3 (three) times daily. 05/08/19   Tasia Catchings, [place] Lautner V, PA-C  tobramycin (TOBREX) 0.3 % ophthalmic solution Place 1 drop into the right eye every 4 (four) hours. 11/06/18   Robyn Haber, MD    Family History Family History  Problem Relation Age of Onset  . Diabetes Mother   . Stroke Father   . Breast cancer Neg Hx     Social History Social History   Tobacco Use  . Smoking status: Never Smoker  . Smokeless tobacco: Never Used  Substance Use Topics  . Alcohol use: No  . Drug use: No     Allergies   Hydroxyzine pamoate   Review of Systems Review of Systems  Reason unable to perform ROS: See HPI as above.     Physical Exam Triage Vital Signs ED Triage Vitals  Enc Vitals Group     BP --      Pulse Rate 05/08/19 1322 61     Resp 05/08/19 1322 18     Temp 05/08/19 1322 98.2 F (36.8 C)     Temp Source 05/08/19 1322 Oral     SpO2 05/08/19 1322 (!) 2 %     Weight 05/08/19 1322 123 lb (55.8 kg)     Height --      Head Circumference --      Peak Flow --      Pain Score 05/08/19 1321 6     Pain Loc --      Pain Edu? --      Excl. in [place]? --    No data found.  Updated Vital Signs Pulse 61   Temp 98.2 F (36.8 C) (Oral)   Resp 18   Wt 123 lb (55.8 kg)   SpO2 98%   BMI 22.14 kg/m   Physical Exam Constitutional:      General: She is not in acute distress.    Appearance: She is well-developed. She is not ill-appearing, toxic-appearing or diaphoretic.  HENT:     Head: Normocephalic and atraumatic.  Eyes:     Conjunctiva/sclera: Conjunctivae normal.     Pupils: Pupils are equal, round, and reactive to light.  Cardiovascular:     Rate and Rhythm: Normal rate and regular rhythm.     Heart sounds: Normal heart sounds. No murmur. No friction rub. No gallop.   Pulmonary:     Effort: Pulmonary effort is normal.     Breath sounds: Normal breath sounds. No wheezing or rales.  Abdominal:     General: Bowel sounds are normal.     Palpations: Abdomen is soft.      Tenderness: There is no abdominal tenderness. There is left CVA tenderness. There is no right CVA tenderness, guarding or rebound.  Skin:  General: Skin is warm and dry.  Neurological:     Mental Status: She is alert and oriented to person, place, and time.  Psychiatric:        Behavior: Behavior normal.        Judgment: Judgment normal.      UC Treatments / Results  Labs (all labs ordered are listed, but only abnormal results are displayed) Labs Reviewed  POCT URINALYSIS DIP (DEVICE) - Abnormal; Notable for the following components:      Result Value   Hgb urine dipstick LARGE (*)    Leukocytes,Ua SMALL (*)    All other components within normal limits  URINE CULTURE    EKG   Radiology No results found.  Procedures Procedures (including critical care time)  Medications Ordered in UC Medications - No data to display  Initial Impression / Assessment and Plan / UC Course  I have reviewed the triage vital signs and the nursing notes.  Pertinent labs & imaging results that were available during my care of the patient were reviewed by me and considered in my medical decision making (see chart for details).    Will treat for pyelonephritis given history and exam.  Start Keflex as directed.  Other symptomatic treatment discussed.  Push fluids.  Return precautions given.  Patient expresses understanding and agrees to plan.  Final Clinical Impressions(s) / UC Diagnoses   Final diagnoses:  Pyelonephritis    ED Prescriptions    Medication Sig Dispense Auth. Provider   cephALEXin (KEFLEX) 500 MG capsule Take 1 capsule (500 mg total) by mouth 2 (two) times daily. 20 capsule Lorelee Mclaurin V, PA-C   fluconazole (DIFLUCAN) 150 MG tablet Take 1 tablet (150 mg total) by mouth daily. Take second dose 72 hours later if symptoms still persists. 2 tablet Cailan General V, PA-C   phenazopyridine (PYRIDIUM) 200 MG tablet Take 1 tablet (200 mg total) by mouth 3 (three) times daily. 6 tablet Tobin Chad, [place] 05/08/19 1430

## 2019-05-08 NOTE — Discharge Instructions (Signed)
I am treating you for a kidney infection, start Keflex as directed.  Start Diflucan to prevent yeast infection.  He can use Pyridium to help with burning sensation. Keep hydrated, urine should be clear to pale yellow in color.  If experiencing worsening symptoms, nausea/vomiting, fever, unable to urinate, go to the emergency department for further evaluation needed.

## 2019-05-08 NOTE — ED Triage Notes (Signed)
Pt states she has a UTI. Pt states she has been hurting in her back.  Pt states she has been voiding a lot.

## 2019-05-09 LAB — URINE CULTURE

## 2019-05-18 ENCOUNTER — Other Ambulatory Visit: Payer: Self-pay | Admitting: Family Medicine

## 2019-05-20 ENCOUNTER — Other Ambulatory Visit: Payer: Self-pay | Admitting: Family Medicine

## 2019-05-20 DIAGNOSIS — Z1231 Encounter for screening mammogram for malignant neoplasm of breast: Secondary | ICD-10-CM

## 2019-05-30 ENCOUNTER — Other Ambulatory Visit: Payer: Self-pay

## 2019-05-30 ENCOUNTER — Ambulatory Visit (INDEPENDENT_AMBULATORY_CARE_PROVIDER_SITE_OTHER): Payer: Medicaid Other | Admitting: Family Medicine

## 2019-05-30 ENCOUNTER — Encounter: Payer: Self-pay | Admitting: Family Medicine

## 2019-05-30 VITALS — BP 132/74 | HR 70 | Temp 98.7°F | Resp 18 | Ht 62.5 in | Wt 126.0 lb

## 2019-05-30 DIAGNOSIS — M810 Age-related osteoporosis without current pathological fracture: Secondary | ICD-10-CM | POA: Diagnosis not present

## 2019-05-30 DIAGNOSIS — Z0001 Encounter for general adult medical examination with abnormal findings: Secondary | ICD-10-CM

## 2019-05-30 DIAGNOSIS — Z124 Encounter for screening for malignant neoplasm of cervix: Secondary | ICD-10-CM | POA: Diagnosis not present

## 2019-05-30 DIAGNOSIS — E559 Vitamin D deficiency, unspecified: Secondary | ICD-10-CM

## 2019-05-30 DIAGNOSIS — M48062 Spinal stenosis, lumbar region with neurogenic claudication: Secondary | ICD-10-CM | POA: Diagnosis not present

## 2019-05-30 DIAGNOSIS — I10 Essential (primary) hypertension: Secondary | ICD-10-CM

## 2019-05-30 DIAGNOSIS — E7800 Pure hypercholesterolemia, unspecified: Secondary | ICD-10-CM

## 2019-05-30 DIAGNOSIS — Z0000 Encounter for general adult medical examination without abnormal findings: Secondary | ICD-10-CM

## 2019-05-30 NOTE — Progress Notes (Signed)
Subjective:    Patient ID: Heather Gillespie, female    DOB: 1961/08/22, 58 y.o.   MRN: [id number]  HPI  Patient is here today for complete physical exam.  Patient denies any medical concerns.  Her last mammogram was in October 2019.  She is due again this October.  The patient is already scheduled for mammogram for October 23.  Her last colonoscopy was in 2017 and is up-to-date.  Her last Pap smear was in 2017 and is due today.  Her last bone density test was in 2018.  Patient is due again for repeat bone density as she has a history of osteoporosis and is currently on Fosamax and calcium.  She is due for her flu shot.  She adamantly refuses this given her mother's history of Guyon Barr syndrome.  Otherwise she has been doing well aside from neuropathic pain radiating from her lower back down her left leg.  She has been sporadically taking gabapentin.  She is not taking it 3 times a day on a scheduled basis.  I have recommended that she try that to see if by keeping the medication in her system she could better thwart her pain.  Immunization records are listed below: Immunization History  Administered Date(s) Administered  . Td 02/13/2006  . Tdap 09/22/2012     Past Medical History:  Diagnosis Date  . Abdominal pain   . Allergy   . GERD (gastroesophageal reflux disease)   . Glaucoma   . Hemorrhoids   . Hiatal hernia   . IBS (irritable bowel syndrome)   . N&V (nausea and vomiting)   . Osteoporosis   . Rectal bleeding   . Rectal ulcer   . Sinus problem   . TMJ (dislocation of temporomandibular joint)   . Vitamin D deficiency    Past Surgical History:  Procedure Laterality Date  . ANAL FISSURE REPAIR    . CHOLECYSTECTOMY  1984  . COLONOSCOPY W/ POLYPECTOMY     colon ulcers  . EYE SURGERY    . HEMI-MICRODISCECTOMY LUMBAR LAMINECTOMY LEVEL 1 Left 05/26/2013   Procedure: HEMI-MICRODISCECTOMY LUMBAR LAMINECTOMY L4 - L5 ON THE LEFT LEVEL 1;  Surgeon: Tobi Bastos, MD;   Location: [facility];  Service: Orthopedics;  Laterality: Left;  . HEMORROIDECTOMY     Current Outpatient Medications on File Prior to Visit  Medication Sig Dispense Refill  . alendronate (FOSAMAX) 70 MG tablet TAKE 1 TABLET BY MOUTH EVERY 7 DAYS. TAKE WITH A FULL GLASS OF WATER ON AN EMPTY STOMACH. 4 tablet 11  . dexlansoprazole (DEXILANT) 60 MG capsule Take 60 mg by mouth daily.    . Ergocalciferol (VITAMIN D2) 2000 units TABS Take 1 tablet by mouth daily.    . fexofenadine (ALLEGRA) 180 MG tablet Take 1 tablet (180 mg total) by mouth daily. 30 tablet 11  . fluticasone (FLONASE) 50 MCG/ACT nasal spray PLACE 2 SPRAYS INTO EACH NOSTRIL ONCE DAILY 16 mL 11  . gabapentin (NEURONTIN) 300 MG capsule TAKE 1 CAPSULE BY MOUTH THREE TIMES A DAY 90 capsule 5  . hydrochlorothiazide (HYDRODIURIL) 25 MG tablet TAKE 1 TABLET BY MOUTH EVERY DAY 30 tablet 11  . losartan (COZAAR) 100 MG tablet TAKE 1 TABLET BY MOUTH EVERY DAY 90 tablet 2  . PAZEO 0.7 % SOLN INSTILL 1 DROP INTO BOTH EYES EVERY DAY 2.5 mL 3   No current facility-administered medications on file prior to visit.    Allergies  Allergen Reactions  . Hydroxyzine Pamoate Rash  Social History   Socioeconomic History  . Marital status: Single    Spouse name: Not on file  . Number of children: Not on file  . Years of education: Not on file  . Highest education level: Not on file  Occupational History  . Not on file  Social Needs  . Financial resource strain: Not on file  . Food insecurity    Worry: Not on file    Inability: Not on file  . Transportation needs    Medical: Not on file    Non-medical: Not on file  Tobacco Use  . Smoking status: Never Smoker  . Smokeless tobacco: Never Used  Substance and Sexual Activity  . Alcohol use: No  . Drug use: No  . Sexual activity: Not Currently  Lifestyle  . Physical activity    Days per week: Not on file    Minutes per session: Not on file  . Stress: Not on file  Relationships  . Social  Herbalist on phone: Not on file    Gets together: Not on file    Attends religious service: Not on file    Active member of club or organization: Not on file    Attends meetings of clubs or organizations: Not on file    Relationship status: Not on file  . Intimate partner violence    Fear of current or ex partner: Not on file    Emotionally abused: Not on file    Physically abused: Not on file    Forced sexual activity: Not on file  Other Topics Concern  . Not on file  Social History Narrative  . Not on file   Family History  Problem Relation Age of Onset  . Diabetes Mother   . Stroke Father   . Breast cancer Neg Hx       Review of Systems  All other systems reviewed and are negative.      Objective:   Physical Exam  Constitutional: She is oriented to person, place, and time. She appears well-developed and well-nourished. No distress.  HENT:  Head: Normocephalic and atraumatic.  Right Ear: External ear normal.  Left Ear: External ear normal.  Nose: Nose normal.  Mouth/Throat: Oropharynx is clear and moist. No oropharyngeal exudate.  Eyes: Pupils are equal, round, and reactive to light. Conjunctivae and EOM are normal. Right eye exhibits no discharge. Left eye exhibits no discharge. No scleral icterus.  Neck: Normal range of motion. Neck supple. No JVD present. No tracheal deviation present. No thyromegaly present.  Cardiovascular: Normal rate, regular rhythm, normal heart sounds and intact distal pulses. Exam reveals no gallop and no friction rub.  No murmur heard. Pulmonary/Chest: Effort normal and breath sounds normal. No stridor. No respiratory distress. She has no wheezes. She has no rales. She exhibits no tenderness.  Abdominal: Soft. Bowel sounds are normal. She exhibits no distension and no mass. There is no abdominal tenderness. There is no rebound and no guarding.  Genitourinary:    Uterus normal.  Cervix exhibits no motion tenderness, no discharge  and no friability. Right adnexum displays no mass and no tenderness. Left adnexum displays no mass and no tenderness.  Musculoskeletal: Normal range of motion.        General: No tenderness or edema.  Lymphadenopathy:    She has no cervical adenopathy.  Neurological: She is alert and oriented to person, place, and time. She has normal reflexes. No cranial nerve deficit. She exhibits normal muscle  tone. Coordination normal.  Skin: Skin is warm. No rash noted. She is not diaphoretic. No erythema. No pallor.  Psychiatric: She has a normal mood and affect. Her behavior is normal. Judgment and thought content normal.  Vitals reviewed.         Assessment & Plan:  General medical exam  Osteoporosis, unspecified osteoporosis type, unspecified pathological fracture presence  Vitamin D deficiency - Plan: VITAMIN D 25 Hydroxy (Vit-D Deficiency, Fractures)  Spinal stenosis, lumbar region, with neurogenic claudication  Benign essential HTN  Pure hypercholesterolemia - Plan: CBC with Differential/Platelet, COMPLETE METABOLIC PANEL WITH GFR, Lipid panel Patient's physical exam today is normal.  Her blood pressure is excellent.  I have encouraged the patient to take gabapentin 3 times a day on a scheduled basis to better manage her neuropathic pain in her left leg.  Patient has already scheduled her mammogram.  I will schedule her for a bone density test to monitor her osteoporosis.  If worsening we may want to consider switching to Prolia.  Refuses a flu shot.  Otherwise I will check a CBC, CMP, fasting lipid panel as well as a vitamin D level given her history of vitamin D deficiency.  Her Pap smear was sent to pathology in a labeled container to screen for cervical cancer

## 2019-05-31 LAB — CBC WITH DIFFERENTIAL/PLATELET
Absolute Monocytes: 220 cells/uL (ref 200–950)
Basophils Absolute: 29 cells/uL (ref 0–200)
Basophils Relative: 0.8 %
Eosinophils Absolute: 61 cells/uL (ref 15–500)
Eosinophils Relative: 1.7 %
HCT: 43.2 % (ref 35.0–45.0)
Hemoglobin: 14.4 g/dL (ref 11.7–15.5)
Lymphs Abs: 1411 cells/uL (ref 850–3900)
MCH: 29.8 pg (ref 27.0–33.0)
MCHC: 33.3 g/dL (ref 32.0–36.0)
MCV: 89.3 fL (ref 80.0–100.0)
MPV: 10.7 fL (ref 7.5–12.5)
Monocytes Relative: 6.1 %
Neutro Abs: 1879 cells/uL (ref 1500–7800)
Neutrophils Relative %: 52.2 %
Platelets: 200 10*3/uL (ref 140–400)
RBC: 4.84 10*6/uL (ref 3.80–5.10)
RDW: 12.5 % (ref 11.0–15.0)
Total Lymphocyte: 39.2 %
WBC: 3.6 10*3/uL — ABNORMAL LOW (ref 3.8–10.8)

## 2019-05-31 LAB — COMPLETE METABOLIC PANEL WITH GFR
AG Ratio: 1.5 (calc) (ref 1.0–2.5)
ALT: 7 U/L (ref 6–29)
AST: 16 U/L (ref 10–35)
Albumin: 4.5 g/dL (ref 3.6–5.1)
Alkaline phosphatase (APISO): 72 U/L (ref 37–153)
BUN: 15 mg/dL (ref 7–25)
CO2: 27 mmol/L (ref 20–32)
Calcium: 9.9 mg/dL (ref 8.6–10.4)
Chloride: 99 mmol/L (ref 98–110)
Creat: 0.88 mg/dL (ref 0.50–1.05)
GFR, Est African American: 84 mL/min/{1.73_m2} (ref >=60)
GFR, Est Non African American: 72 mL/min/{1.73_m2} (ref >=60)
Globulin: 3.1 g/dL (calc) (ref 1.9–3.7)
Glucose, Bld: 87 mg/dL (ref 65–99)
Potassium: 3.7 mmol/L (ref 3.5–5.3)
Sodium: 136 mmol/L (ref 135–146)
Total Bilirubin: 1 mg/dL (ref 0.2–1.2)
Total Protein: 7.6 g/dL (ref 6.1–8.1)

## 2019-05-31 LAB — LIPID PANEL
Cholesterol: 211 mg/dL — ABNORMAL HIGH (ref <200)
HDL: 61 mg/dL (ref >=50)
LDL Cholesterol (Calc): 130 mg/dL (calc) — ABNORMAL HIGH
Non-HDL Cholesterol (Calc): 150 mg/dL (calc) — ABNORMAL HIGH (ref <130)
Total CHOL/HDL Ratio: 3.5 (calc) (ref <5.0)
Triglycerides: 92 mg/dL (ref <150)

## 2019-05-31 LAB — PAP, TP IMAGING W/ HPV RNA, RFLX HPV TYPE 16,18/45: HPV DNA High Risk: NOT DETECTED

## 2019-05-31 LAB — VITAMIN D 25 HYDROXY (VIT D DEFICIENCY, FRACTURES): Vit D, 25-Hydroxy: 37 ng/mL (ref 30–100)

## 2019-06-06 ENCOUNTER — Encounter: Payer: Self-pay | Admitting: Family Medicine

## 2019-06-09 ENCOUNTER — Other Ambulatory Visit: Payer: Self-pay | Admitting: Family Medicine

## 2019-07-03 ENCOUNTER — Encounter (HOSPITAL_COMMUNITY): Payer: Self-pay

## 2019-07-03 ENCOUNTER — Other Ambulatory Visit: Payer: Self-pay

## 2019-07-03 ENCOUNTER — Ambulatory Visit (HOSPITAL_COMMUNITY)
Admission: EM | Admit: 2019-07-03 | Discharge: 2019-07-03 | Disposition: A | Discharge status: Home / Self Care | Payer: Medicaid Other | Attending: Emergency Medicine | Admitting: Emergency Medicine

## 2019-07-03 DIAGNOSIS — N069 Isolated proteinuria with unspecified morphologic lesion: Secondary | ICD-10-CM | POA: Diagnosis not present

## 2019-07-03 DIAGNOSIS — N3001 Acute cystitis with hematuria: Secondary | ICD-10-CM | POA: Insufficient documentation

## 2019-07-03 LAB — POCT URINALYSIS DIP (DEVICE)
Glucose, UA: NEGATIVE mg/dL
Ketones, ur: NEGATIVE mg/dL
Leukocytes,Ua: NEGATIVE
Nitrite: NEGATIVE
Protein, ur: 30 mg/dL — AB
Specific Gravity, Urine: >=1.03 (ref 1.005–1.030)
Urobilinogen, UA: 0.2 mg/dL (ref 0.0–1.0)
pH: 5.5 (ref 5.0–8.0)

## 2019-07-03 MED ORDER — NITROFURANTOIN MONOHYD MACRO 100 MG PO CAPS: 100.0000 mg | ORAL_CAPSULE | Freq: Two times a day (BID) | ORAL | 0 refills | Status: DC

## 2019-07-03 NOTE — ED Triage Notes (Signed)
Pt presents with dysuria, abdominal pain and hematuria for past 6 days

## 2019-07-03 NOTE — ED Provider Notes (Signed)
[facility]    CSN: [id number] Arrival date & time: 07/03/19  1333      History   Chief Complaint Chief Complaint  Patient presents with  . Recurrent UTI    HPI Heather Gillespie is a 58 y.o. female.   Patient here concerned with UTI x 6 days.  Admits dysuria, hematuria, frequency, lower abdominal pain,denies fever, chills, nausea, vomiting, vaginal discharge, vaginal bleeding.  Saw PCP 1 - 2 weeks ago, had annual PAP which was reportedly normal.  She has not tried any medication for this.     Past Medical History:  Diagnosis Date  . Abdominal pain   . Allergy   . GERD (gastroesophageal reflux disease)   . Glaucoma   . Hemorrhoids   . Hiatal hernia   . IBS (irritable bowel syndrome)   . N&V (nausea and vomiting)   . Osteoporosis   . Rectal bleeding   . Rectal ulcer   . Sinus problem   . TMJ (dislocation of temporomandibular joint)   . Vitamin D deficiency     Patient Active Problem List   Diagnosis Date Noted  . Spinal stenosis, lumbar region, with neurogenic claudication 05/26/2013  . 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 Vitamin D deficiency   . TMJ (dislocation of temporomandibular joint)   . Osteoporosis   . Rectal pain 03/31/2011  . ANEMIA, IRON DEFICIENCY NOS 02/19/2007  . [place] SYNDROME 02/19/2007  . HEMORRHOIDS NOS, W/O COMPLICATIONS [id number]  . RHINITIS, ALLERGIC NOS 02/19/2007  . IBS 02/19/2007  . DIARRHEA, CHRONIC 02/19/2007  . UTI (urinary tract infection) 02/19/2007    Past Surgical History:  Procedure Laterality Date  . ANAL FISSURE REPAIR    . CHOLECYSTECTOMY  1984  . COLONOSCOPY W/ POLYPECTOMY     colon ulcers  . EYE SURGERY    . HEMI-MICRODISCECTOMY LUMBAR LAMINECTOMY LEVEL 1 Left 05/26/2013   Procedure: HEMI-MICRODISCECTOMY LUMBAR LAMINECTOMY L4 - L5 ON THE LEFT LEVEL 1;  Surgeon: Tobi Bastos, MD;  Location: [facility];  Service: Orthopedics;  Laterality: Left;  .  HEMORROIDECTOMY      OB History   No obstetric history on file.      Home Medications    Prior to Admission medications   Medication Sig Start Date End Date Taking? Authorizing Provider  alendronate (FOSAMAX) 70 MG tablet TAKE 1 TABLET BY MOUTH EVERY 7 DAYS. TAKE WITH A FULL GLASS OF WATER ON AN EMPTY STOMACH. 04/20/19   Susy Frizzle, MD  dexlansoprazole (DEXILANT) 60 MG capsule Take 60 mg by mouth daily.    [provider]  Ergocalciferol (VITAMIN D2) 2000 units TABS Take 1 tablet by mouth daily.    [provider]  fexofenadine (ALLEGRA) 180 MG tablet Take 1 tablet (180 mg total) by mouth daily. 07/31/18   Susy Frizzle, MD  fluticasone (FLONASE) 50 MCG/ACT nasal spray SPRAY 2 SPRAYS INTO EACH NOSTRIL EVERY DAY 06/09/19   Susy Frizzle, MD  gabapentin (NEURONTIN) 300 MG capsule TAKE 1 CAPSULE BY MOUTH THREE TIMES A DAY 05/09/19   Susy Frizzle, MD  hydrochlorothiazide (HYDRODIURIL) 25 MG tablet TAKE 1 TABLET BY MOUTH EVERY DAY 02/16/19   Susy Frizzle, MD  losartan (COZAAR) 100 MG tablet TAKE 1 TABLET BY MOUTH EVERY DAY 05/18/19   Susy Frizzle, MD  nitrofurantoin, macrocrystal-monohydrate, (MACROBID) 100 MG capsule Take 1 capsule (100 mg total) by mouth 2 (two) times daily. 07/03/19   Peri Jefferson,  PA-C  PAZEO 0.7 % SOLN INSTILL 1 DROP INTO BOTH EYES EVERY DAY 04/20/19   Susy Frizzle, MD    Family History Family History  Problem Relation Age of Onset  . Diabetes Mother   . Stroke Father   . Breast cancer Neg Hx     Social History Social History   Tobacco Use  . Smoking status: Never Smoker  . Smokeless tobacco: Never Used  Substance Use Topics  . Alcohol use: No  . Drug use: No     Allergies   Hydroxyzine pamoate   Review of Systems Review of Systems  Constitutional: Negative for chills, fatigue and fever.  Gastrointestinal: Negative for abdominal pain, anal bleeding, constipation, diarrhea, nausea and vomiting.   Genitourinary: Positive for dysuria and hematuria. Negative for decreased urine volume, difficulty urinating, flank pain, frequency, genital sores, urgency, vaginal bleeding, vaginal discharge and vaginal pain.  Musculoskeletal: Negative for back pain.  Skin: Negative for color change, pallor and rash.  Allergic/Immunologic: Negative for immunocompromised state.  Neurological: Negative for dizziness, light-headedness and headaches.  Hematological: Negative for adenopathy.  Psychiatric/Behavioral: Negative for confusion, decreased concentration and sleep disturbance.  All other systems reviewed and are negative.    Physical Exam Triage Vital Signs ED Triage Vitals  Enc Vitals Group     BP 07/03/19 1413 118/70     Pulse Rate 07/03/19 1413 71     Resp 07/03/19 1413 18     Temp 07/03/19 1413 98.2 F (36.8 C)     Temp src --      SpO2 07/03/19 1413 98 %     Weight --      Height --      Head Circumference --      Peak Flow --      Pain Score 07/03/19 1411 10     Pain Loc --      Pain Edu? --      Excl. in [place]? --    No data found.  Updated Vital Signs BP 118/70   Pulse 71   Temp 98.2 F (36.8 C)   Resp 18   SpO2 98%   Visual Acuity Right Eye Distance:   Left Eye Distance:   Bilateral Distance:    Right Eye Near:   Left Eye Near:    Bilateral Near:     Physical Exam Vitals signs and nursing note reviewed.  Constitutional:      General: She is not in acute distress.    Appearance: Normal appearance. She is well-developed.  HENT:     Head: Normocephalic and atraumatic.     Nose: Nose normal.  Eyes:     General: No scleral icterus.    Extraocular Movements: Extraocular movements intact.     Conjunctiva/sclera: Conjunctivae normal.  Neck:     Musculoskeletal: Neck supple.  Cardiovascular:     Rate and Rhythm: Normal rate and regular rhythm.     Heart sounds: No murmur.  Pulmonary:     Effort: Pulmonary effort is normal. No respiratory distress.     Breath  sounds: Normal breath sounds.  Abdominal:     Palpations: Abdomen is soft.     Tenderness: There is abdominal tenderness in the suprapubic area. There is no right CVA tenderness, left CVA tenderness, guarding or rebound. Negative signs include Murphy's sign.  Musculoskeletal: Normal range of motion.        General: No swelling or tenderness.  Skin:    General: Skin is warm and  dry.     Capillary Refill: Capillary refill takes less than 2 seconds.  Neurological:     General: No focal deficit present.     Mental Status: She is alert and oriented to person, place, and time.  Psychiatric:        Mood and Affect: Mood normal.        Behavior: Behavior normal.      UC Treatments / Results  Labs (all labs ordered are listed, but only abnormal results are displayed) Labs Reviewed  POCT URINALYSIS DIP (DEVICE) - Abnormal; Notable for the following components:      Result Value   Bilirubin Urine SMALL (*)    Hgb urine dipstick LARGE (*)    Protein, ur 30 (*)    All other components within normal limits  URINE CULTURE    EKG   Radiology No results found.  Procedures Procedures (including critical care time)  Medications Ordered in UC Medications - No data to display  Initial Impression / Assessment and Plan / UC Course  I have reviewed the triage vital signs and the nursing notes.  Pertinent labs & imaging results that were available during my care of the patient were reviewed by me and considered in my medical decision making (see chart for details).      Final Clinical Impressions(s) / UC Diagnoses   Final diagnoses:  Acute cystitis with hematuria  Isolated proteinuria with morphologic lesion     Discharge Instructions     Follow up with PCP or urology Take medication as prescribed.     ED Prescriptions    Medication Sig Dispense Auth. Provider   nitrofurantoin, macrocrystal-monohydrate, (MACROBID) 100 MG capsule Take 1 capsule (100 mg total) by mouth 2  (two) times daily. 14 capsule Peri Jefferson, PA-C     PDMP not reviewed this encounter.   Peri Jefferson, PA-C 07/03/19 [phone]

## 2019-07-03 NOTE — Discharge Instructions (Addendum)
Follow up with PCP or urology Take medication as prescribed.

## 2019-07-04 LAB — URINE CULTURE

## 2019-07-08 ENCOUNTER — Ambulatory Visit
Admission: RE | Admit: 2019-07-08 | Discharge: 2019-07-08 | Disposition: A | Discharge status: Home / Self Care | Payer: Medicaid Other | Source: Ambulatory Visit | Attending: Family Medicine | Admitting: Family Medicine

## 2019-07-08 ENCOUNTER — Other Ambulatory Visit: Payer: Self-pay

## 2019-07-08 DIAGNOSIS — Z1231 Encounter for screening mammogram for malignant neoplasm of breast: Secondary | ICD-10-CM

## 2019-07-14 ENCOUNTER — Other Ambulatory Visit: Payer: Self-pay | Admitting: Family Medicine

## 2019-07-21 ENCOUNTER — Telehealth: Payer: Self-pay | Admitting: *Deleted

## 2019-07-21 MED ORDER — FEXOFENADINE HCL 180 MG PO TABS: 180.0000 mg | ORAL_TABLET | Freq: Every day | ORAL | 11 refills | Status: DC

## 2019-07-21 NOTE — Telephone Encounter (Signed)
Received request from pharmacy for PA on   PA submitted. Received immediate determination.   PA  [id number] approved for 102 tabs per 12 months.

## 2019-08-02 ENCOUNTER — Other Ambulatory Visit: Payer: Self-pay

## 2019-08-02 ENCOUNTER — Ambulatory Visit
Admission: RE | Admit: 2019-08-02 | Discharge: 2019-08-02 | Disposition: A | Discharge status: Home / Self Care | Payer: Medicaid Other | Source: Ambulatory Visit | Attending: Family Medicine | Admitting: Family Medicine

## 2019-08-02 DIAGNOSIS — M810 Age-related osteoporosis without current pathological fracture: Secondary | ICD-10-CM

## 2019-08-02 DIAGNOSIS — Z780 Asymptomatic menopausal state: Secondary | ICD-10-CM | POA: Diagnosis not present

## 2019-09-17 ENCOUNTER — Other Ambulatory Visit: Payer: Self-pay | Admitting: Family Medicine

## 2019-09-27 DIAGNOSIS — Z961 Presence of intraocular lens: Secondary | ICD-10-CM | POA: Diagnosis not present

## 2019-09-27 DIAGNOSIS — H02831 Dermatochalasis of right upper eyelid: Secondary | ICD-10-CM | POA: Diagnosis not present

## 2019-09-27 DIAGNOSIS — H04123 Dry eye syndrome of bilateral lacrimal glands: Secondary | ICD-10-CM | POA: Diagnosis not present

## 2019-09-27 DIAGNOSIS — H18413 Arcus senilis, bilateral: Secondary | ICD-10-CM | POA: Diagnosis not present

## 2019-10-10 DIAGNOSIS — K219 Gastro-esophageal reflux disease without esophagitis: Secondary | ICD-10-CM | POA: Diagnosis not present

## 2019-11-03 ENCOUNTER — Other Ambulatory Visit: Payer: Self-pay | Admitting: Family Medicine

## 2019-12-03 ENCOUNTER — Ambulatory Visit (HOSPITAL_COMMUNITY)
Admission: EM | Admit: 2019-12-03 | Discharge: 2019-12-03 | Disposition: A | Discharge status: Home / Self Care | Payer: Medicaid Other | Attending: Emergency Medicine | Admitting: Emergency Medicine

## 2019-12-03 ENCOUNTER — Other Ambulatory Visit: Payer: Self-pay

## 2019-12-03 ENCOUNTER — Encounter (HOSPITAL_COMMUNITY): Payer: Self-pay | Admitting: *Deleted

## 2019-12-03 DIAGNOSIS — N390 Urinary tract infection, site not specified: Secondary | ICD-10-CM | POA: Diagnosis not present

## 2019-12-03 LAB — POCT URINALYSIS DIP (DEVICE)
Bilirubin Urine: NEGATIVE
Glucose, UA: NEGATIVE mg/dL
Ketones, ur: NEGATIVE mg/dL
Nitrite: POSITIVE — AB
Protein, ur: 30 mg/dL — AB
Specific Gravity, Urine: 1.02 (ref 1.005–1.030)
Urobilinogen, UA: 0.2 mg/dL (ref 0.0–1.0)
pH: 6 (ref 5.0–8.0)

## 2019-12-03 MED ORDER — ONDANSETRON 4 MG PO TBDP: 4.0000 mg | ORAL_TABLET | Freq: Three times a day (TID) | ORAL | 0 refills | Status: DC | PRN

## 2019-12-03 MED ORDER — SULFAMETHOXAZOLE-TRIMETHOPRIM 800-160 MG PO TABS: 1.0000 | ORAL_TABLET | Freq: Two times a day (BID) | ORAL | 0 refills | Status: AC

## 2019-12-03 NOTE — ED Triage Notes (Signed)
Reports starting with dysuria, polyuria, urinary urgency x 5 days.  Now also c/o back pain, low abd pain, nausea.  Has also noticied some hematuria.  Has not had any meds for sxs.

## 2019-12-03 NOTE — ED Provider Notes (Signed)
[facility] [facility]    CSN: [id number] Arrival date & time: 12/03/19  1001      History   Chief Complaint Chief Complaint  Patient presents with  . Dysuria    HPI Heather Gillespie is a 59 y.o. female history of recurrent UTI, IBS, osteoporosis, presenting today for evaluation of possible UTI.  Patient states that over the past 5 days she has had dysuria, urinary frequency and urgency.  She has also noticed a mild amount of hematuria.  She has had some abdominal pain and lower back pain.  Has reported nausea and low-grade fevers.  Denies any vomiting.  HPI  Past Medical History:  Diagnosis Date  . Abdominal pain   . Allergy   . GERD (gastroesophageal reflux disease)   . Glaucoma   . Hemorrhoids   . Hiatal hernia   . IBS (irritable bowel syndrome)   . N&V (nausea and vomiting)   . Osteoporosis   . Rectal bleeding   . Rectal ulcer   . Sinus problem   . TMJ (dislocation of temporomandibular joint)   . Vitamin D deficiency     Patient Active Problem List   Diagnosis Date Noted  . Spinal stenosis, lumbar region, with neurogenic claudication 05/26/2013  . 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 Vitamin D deficiency   . TMJ (dislocation of temporomandibular joint)   . Osteoporosis   . Rectal pain 03/31/2011  . ANEMIA, IRON DEFICIENCY NOS 02/19/2007  . [place] SYNDROME 02/19/2007  . HEMORRHOIDS NOS, W/O COMPLICATIONS [id number]  . RHINITIS, ALLERGIC NOS 02/19/2007  . IBS 02/19/2007  . DIARRHEA, CHRONIC 02/19/2007  . UTI (urinary tract infection) 02/19/2007    Past Surgical History:  Procedure Laterality Date  . ANAL FISSURE REPAIR    . BACK SURGERY    . CHOLECYSTECTOMY  1984  . COLONOSCOPY W/ POLYPECTOMY     colon ulcers  . EYE SURGERY    . HEMI-MICRODISCECTOMY LUMBAR LAMINECTOMY LEVEL 1 Left 05/26/2013   Procedure: HEMI-MICRODISCECTOMY LUMBAR LAMINECTOMY L4 - L5 ON THE LEFT LEVEL 1;  Surgeon: Tobi Bastos, MD;   Location: [facility];  Service: Orthopedics;  Laterality: Left;  . HEMORROIDECTOMY      OB History   No obstetric history on file.      Home Medications    Prior to Admission medications   Medication Sig Start Date End Date Taking? Authorizing Provider  alendronate (FOSAMAX) 70 MG tablet TAKE 1 TABLET BY MOUTH EVERY 7 DAYS. TAKE WITH A FULL GLASS OF WATER ON AN EMPTY STOMACH. 04/20/19  Yes Susy Frizzle, MD  dexlansoprazole (DEXILANT) 60 MG capsule Take 60 mg by mouth daily.   Yes [provider]  Ergocalciferol (VITAMIN D2) 2000 units TABS Take 1 tablet by mouth daily.   Yes [provider]  fexofenadine (ALLEGRA) 180 MG tablet Take 1 tablet (180 mg total) by mouth daily. 07/21/19  Yes Susy Frizzle, MD  fluticasone (FLONASE) 50 MCG/ACT nasal spray SPRAY 2 SPRAYS INTO EACH NOSTRIL EVERY DAY 06/09/19  Yes Susy Frizzle, MD  gabapentin (NEURONTIN) 300 MG capsule TAKE 1 CAPSULE BY MOUTH THREE TIMES A DAY 11/04/19  Yes Susy Frizzle, MD  hydrochlorothiazide (HYDRODIURIL) 25 MG tablet TAKE 1 TABLET BY MOUTH EVERY DAY 02/16/19  Yes Susy Frizzle, MD  losartan (COZAAR) 100 MG tablet TAKE 1 TABLET BY MOUTH EVERY DAY 05/18/19  Yes Susy Frizzle, MD  PATADAY 0.2 % SOLN INSTILL 1 DROP  INTO AFFECTED EYE EVERY DAY 09/19/19  Yes Susy Frizzle, MD  PAZEO 0.7 % SOLN INSTILL 1 DROP INTO BOTH EYES EVERY DAY 07/14/19  Yes Susy Frizzle, MD  nitrofurantoin, macrocrystal-monohydrate, (MACROBID) 100 MG capsule Take 1 capsule (100 mg total) by mouth 2 (two) times daily. 07/03/19   Peri Jefferson, PA-C  ondansetron (ZOFRAN ODT) 4 MG disintegrating tablet Take 1 tablet (4 mg total) by mouth every 8 (eight) hours as needed for nausea or vomiting. 12/03/19   Abdullah Rizzi C, PA-C  sulfamethoxazole-trimethoprim (BACTRIM DS) 800-160 MG tablet Take 1 tablet by mouth 2 (two) times daily for 7 days. 12/03/19 12/10/19  Asah Lamay, Elesa Hacker, PA-C    Family History Family History    Problem Relation Age of Onset  . Diabetes Mother   . Cancer Mother   . Stroke Father   . Breast cancer Neg Hx     Social History Social History   Tobacco Use  . Smoking status: Never Smoker  . Smokeless tobacco: Never Used  Substance Use Topics  . Alcohol use: No  . Drug use: No     Allergies   Hydroxyzine pamoate   Review of Systems Review of Systems  Constitutional: Positive for chills and fever.  Respiratory: Negative for shortness of breath.   Cardiovascular: Negative for chest pain.  Gastrointestinal: Positive for abdominal pain and nausea. Negative for diarrhea and vomiting.  Genitourinary: Positive for dysuria, frequency and urgency. Negative for flank pain, genital sores, hematuria, menstrual problem, vaginal bleeding, vaginal discharge and vaginal pain.  Musculoskeletal: Positive for back pain.  Skin: Negative for rash.  Neurological: Negative for dizziness, light-headedness and headaches.     Physical Exam Triage Vital Signs ED Triage Vitals  Enc Vitals Group     BP 12/03/19 1011 138/73     Pulse Rate 12/03/19 1011 68     Resp 12/03/19 1011 18     Temp 12/03/19 1011 97.9 F (36.6 C)     Temp Source 12/03/19 1011 Oral     SpO2 12/03/19 1011 100 %     Weight --      Height --      Head Circumference --      Peak Flow --      Pain Score 12/03/19 1012 10     Pain Loc --      Pain Edu? --      Excl. in [place]? --    No data found.  Updated Vital Signs BP 138/73   Pulse 68   Temp 97.9 F (36.6 C) (Oral)   Resp 18   SpO2 100%   Visual Acuity Right Eye Distance:   Left Eye Distance:   Bilateral Distance:    Right Eye Near:   Left Eye Near:    Bilateral Near:     Physical Exam Vitals and nursing note reviewed.  Constitutional:      Appearance: She is well-developed.     Comments: No acute distress  HENT:     Head: Normocephalic and atraumatic.     Nose: Nose normal.  Eyes:     Conjunctiva/sclera: Conjunctivae normal.  Cardiovascular:      Rate and Rhythm: Normal rate.  Pulmonary:     Effort: Pulmonary effort is normal. No respiratory distress.     Comments: Breathing comfortably at rest, CTABL, no wheezing, rales or other adventitious sounds auscultated Abdominal:     General: There is no distension.     Comments: Soft, nondistended, generalized tenderness  throughout entire abdomen, more focal over suprapubic area negative rebound, negative Rovsing, negative McBurney's, no CVA tenderness  Musculoskeletal:        General: Normal range of motion.     Cervical back: Neck supple.  Skin:    General: Skin is warm and dry.  Neurological:     Mental Status: She is alert and oriented to person, place, and time.      UC Treatments / Results  Labs (all labs ordered are listed, but only abnormal results are displayed) Labs Reviewed  POCT URINALYSIS DIP (DEVICE) - Abnormal; Notable for the following components:      Result Value   Hgb urine dipstick LARGE (*)    Protein, ur 30 (*)    Nitrite POSITIVE (*)    Leukocytes,Ua SMALL (*)    All other components within normal limits  URINE CULTURE    EKG   Radiology No results found.  Procedures Procedures (including critical care time)  Medications Ordered in UC Medications - No data to display  Initial Impression / Assessment and Plan / UC Course  I have reviewed the triage vital signs and the nursing notes.  Pertinent labs & imaging results that were available during my care of the patient were reviewed by me and considered in my medical decision making (see chart for details).     UA with positive nitrites, small leuks, will treat for UTI, vital signs stable, does have some nausea and discomfort, will go ahead and cover for pyelonephritis and treat with Bactrim twice daily x1 week.  Push fluids.  Urine culture pending, will alter treatment as needed based off results.  Discussed strict return precautions. Patient verbalized understanding and is agreeable with  plan.  Final Clinical Impressions(s) / UC Diagnoses   Final diagnoses:  Lower urinary tract infectious disease     Discharge Instructions     Urine showed evidence of infection. We are treating you with bactrim- twice daily for 1 week. Be sure to take full course. Stay hydrated- urine should be pale yellow to clear.   Please return or follow up with your primary provider if symptoms not improving with treatment. Please return sooner if you have worsening of symptoms or develop fever, nausea, vomiting, abdominal pain, back pain, lightheadedness, dizziness.     ED Prescriptions    Medication Sig Dispense Auth. Provider   sulfamethoxazole-trimethoprim (BACTRIM DS) 800-160 MG tablet Take 1 tablet by mouth 2 (two) times daily for 7 days. 14 tablet Youa Deloney C, PA-C   ondansetron (ZOFRAN ODT) 4 MG disintegrating tablet Take 1 tablet (4 mg total) by mouth every 8 (eight) hours as needed for nausea or vomiting. 20 tablet Hovanes Hymas, [place] C, PA-C     PDMP not reviewed this encounter.   Janith Lima, PA-C 12/03/19 1040

## 2019-12-03 NOTE — Discharge Instructions (Signed)
Urine showed evidence of infection. We are treating you with bactrim twice daily for 1 week. Be sure to take full course. Stay hydrated- urine should be pale yellow to clear.   Please return or follow up with your primary provider if symptoms not improving with treatment. Please return sooner if you have worsening of symptoms or develop fever, nausea, vomiting, abdominal pain, back pain, lightheadedness, dizziness.  

## 2019-12-05 LAB — URINE CULTURE: Culture: >=100000 — AB

## 2020-01-01 ENCOUNTER — Other Ambulatory Visit: Payer: Self-pay | Admitting: Family Medicine

## 2020-01-22 ENCOUNTER — Other Ambulatory Visit: Payer: Self-pay | Admitting: Family Medicine

## 2020-02-08 ENCOUNTER — Other Ambulatory Visit: Payer: Self-pay | Admitting: Family Medicine

## 2020-02-24 ENCOUNTER — Other Ambulatory Visit: Payer: Self-pay | Admitting: Family Medicine

## 2020-02-24 DIAGNOSIS — M810 Age-related osteoporosis without current pathological fracture: Secondary | ICD-10-CM

## 2020-03-11 ENCOUNTER — Ambulatory Visit (HOSPITAL_COMMUNITY)
Admission: EM | Admit: 2020-03-11 | Discharge: 2020-03-11 | Disposition: A | Discharge status: Home / Self Care | Payer: Medicaid Other | Attending: Urgent Care | Admitting: Urgent Care

## 2020-03-11 ENCOUNTER — Other Ambulatory Visit: Payer: Self-pay

## 2020-03-11 ENCOUNTER — Encounter (HOSPITAL_COMMUNITY): Payer: Self-pay | Admitting: Emergency Medicine

## 2020-03-11 DIAGNOSIS — N390 Urinary tract infection, site not specified: Secondary | ICD-10-CM | POA: Diagnosis not present

## 2020-03-11 LAB — POCT URINALYSIS DIP (DEVICE)
Bilirubin Urine: NEGATIVE
Glucose, UA: NEGATIVE mg/dL
Ketones, ur: NEGATIVE mg/dL
Nitrite: NEGATIVE
Protein, ur: 100 mg/dL — AB
Specific Gravity, Urine: 1.025 (ref 1.005–1.030)
Urobilinogen, UA: 0.2 mg/dL (ref 0.0–1.0)
pH: 5.5 (ref 5.0–8.0)

## 2020-03-11 MED ORDER — ONDANSETRON 4 MG PO TBDP
4.0000 mg | ORAL_TABLET | Freq: Three times a day (TID) | ORAL | 0 refills | Status: DC | PRN
Start: 2020-03-11 — End: 2020-05-31

## 2020-03-11 MED ORDER — FLUCONAZOLE 150 MG PO TABS
150.0000 mg | ORAL_TABLET | Freq: Once | ORAL | 0 refills | Status: AC
Start: 2020-03-11 — End: 2020-03-11

## 2020-03-11 MED ORDER — SULFAMETHOXAZOLE-TRIMETHOPRIM 800-160 MG PO TABS: 1.0000 | ORAL_TABLET | Freq: Two times a day (BID) | ORAL | 0 refills | Status: AC

## 2020-03-11 NOTE — ED Triage Notes (Signed)
PT reports dysuria, back pain, abdominal pain, weakness, and hematuria for 1 week.

## 2020-03-11 NOTE — Discharge Instructions (Signed)
Urine showed evidence of infection. We are treating you with bactrim twice daily for 1 week. Be sure to take full course. Stay hydrated- urine should be pale yellow to clear.   Please return or follow up with your primary provider if symptoms not improving with treatment. Please return sooner if you have worsening of symptoms or develop fever, nausea, vomiting, abdominal pain, back pain, lightheadedness, dizziness.  

## 2020-03-11 NOTE — ED Provider Notes (Signed)
[place]    CSN: [id number] Arrival date & time: 03/11/20  1505      History   Chief Complaint Chief Complaint  Patient presents with   Urinary Tract Infection    HPI Heather Gillespie is a 59 y.o. female presenting today for evaluation of UTI.  Patient reports that she has had dysuria, back pain, abdominal discomfort as well as hematuria for approximately 1 week.  She does report subjective fevers and nausea.  Has history of prior UTIs.  Last UTI with positive culture 11/2019.  Had resistance to Cipro.  Sensitive to Bactrim.  No problems with Bactrim.  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 05/26/2013   Vitamin D deficiency    TMJ (dislocation of temporomandibular joint)    Osteoporosis    Rectal pain 03/31/2011   ANEMIA, IRON DEFICIENCY NOS 02/19/2007   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   HEMORRHOIDS NOS, W/O COMPLICATIONS 65/78/4696   RHINITIS, ALLERGIC NOS 02/19/2007   IBS 02/19/2007   DIARRHEA, CHRONIC 02/19/2007   UTI (urinary tract infection) 02/19/2007    Past Surgical History:  Procedure Laterality Date   ANAL FISSURE REPAIR     BACK SURGERY     CHOLECYSTECTOMY  1984   COLONOSCOPY W/ POLYPECTOMY     colon ulcers   EYE SURGERY     HEMI-MICRODISCECTOMY LUMBAR LAMINECTOMY LEVEL 1 Left 05/26/2013   Procedure: HEMI-MICRODISCECTOMY LUMBAR LAMINECTOMY L4 - L5 ON THE LEFT LEVEL 1;  Surgeon: Tobi Bastos, MD;  Location: [facility];  Service: Orthopedics;  Laterality: Left;   HEMORROIDECTOMY      OB History   No obstetric history on file.      Home Medications    Prior to Admission medications   Medication Sig Start Date End Date Taking? Authorizing Provider  alendronate (FOSAMAX) 70 MG tablet TAKE 1 TABLET BY MOUTH EVERY 7 DAYS. TAKE WITH A FULL GLASS OF WATER ON AN EMPTY STOMACH. 02/27/20   Susy Frizzle, MD  dexlansoprazole (DEXILANT) 60 MG capsule Take 60 mg by mouth daily.    [provider]  Ergocalciferol (VITAMIN D2) 2000 units TABS Take 1 tablet by mouth daily.    [provider]  fexofenadine (ALLEGRA) 180 MG tablet TAKE 1 TABLET BY MOUTH EVERY DAY 01/23/20   Susy Frizzle, MD  fluconazole (DIFLUCAN) 150 MG tablet Take 1 tablet (150 mg total) by mouth once for 1 dose. 03/11/20 03/11/20  Mohmmad Saleeby C, PA-C  fluticasone (FLONASE) 50 MCG/ACT nasal spray SPRAY 2 SPRAYS INTO EACH NOSTRIL EVERY DAY 06/09/19   Susy Frizzle, MD  gabapentin (NEURONTIN) 300 MG capsule TAKE 1 CAPSULE BY MOUTH THREE TIMES A DAY 11/04/19   Susy Frizzle, MD  hydrochlorothiazide (HYDRODIURIL) 25 MG tablet TAKE 1 TABLET BY MOUTH EVERY DAY 02/08/20   Susy Frizzle, MD  losartan (COZAAR) 100  MG tablet TAKE 1 TABLET BY MOUTH EVERY DAY 01/02/20   Susy Frizzle, MD  nitrofurantoin, macrocrystal-monohydrate, (MACROBID) 100 MG capsule Take 1 capsule (100 mg total) by mouth 2 (two) times daily. 07/03/19   Peri Jefferson, PA-C  ondansetron (ZOFRAN ODT) 4 MG disintegrating tablet Take 1 tablet (4 mg total) by mouth every 8 (eight) hours as needed for nausea or vomiting. 03/11/20   Ethel Meisenheimer C, PA-C  PATADAY 0.2 % SOLN INSTILL 1 DROP INTO AFFECTED EYE EVERY DAY 09/19/19   Susy Frizzle, MD  PAZEO 0.7 % SOLN INSTILL 1 DROP INTO BOTH EYES EVERY DAY 07/14/19   Susy Frizzle, MD  sulfamethoxazole-trimethoprim (BACTRIM DS) 800-160 MG tablet Take 1 tablet by mouth 2 (two)  times daily for 7 days. 03/11/20 03/18/20  Sibel Khurana, Elesa Hacker, PA-C    Family History Family History  Problem Relation Age of Onset   Diabetes Mother    Cancer Mother    Stroke Father    Breast cancer Neg Hx     Social History Social History   Tobacco Use   Smoking status: Never Smoker   Smokeless tobacco: Never Used  Scientific laboratory technician Use: Never used  Substance Use Topics   Alcohol use: No   Drug use: No     Allergies   Hydroxyzine pamoate   Review of Systems Review of Systems  Constitutional: Positive for fever.  Respiratory: Negative for shortness of breath.   Cardiovascular: Negative for chest pain.  Gastrointestinal: Positive for abdominal pain and nausea. Negative for diarrhea and vomiting.  Genitourinary: Positive for dysuria and frequency. Negative for flank pain, genital sores, hematuria, menstrual problem, vaginal bleeding, vaginal discharge and vaginal pain.  Musculoskeletal: Positive for back pain.  Skin: Negative for rash.  Neurological: Negative for dizziness, light-headedness and headaches.     Physical Exam Triage Vital Signs ED Triage Vitals  Enc Vitals Group     BP 03/11/20 1536 128/60     Pulse Rate 03/11/20 1536 73     Resp 03/11/20 1536 16     Temp 03/11/20 1536 98.7 F (37.1 C)     Temp Source 03/11/20 1536 Oral     SpO2 03/11/20 1536 100 %     Weight --      Height --      Head Circumference --      Peak Flow --      Pain Score 03/11/20 1537 9     Pain Loc --      Pain Edu? --      Excl. in [place]? --    No data found.  Updated Vital Signs BP 128/60    Pulse 73    Temp 98.7 F (37.1 C) (Oral)    Resp 16    SpO2 100%   Visual Acuity Right Eye Distance:   Left Eye Distance:   Bilateral Distance:    Right Eye Near:   Left Eye Near:    Bilateral Near:     Physical Exam Vitals and nursing note reviewed.  Constitutional:      Appearance: She is well-developed.     Comments: No acute distress  HENT:     Head:  Normocephalic and atraumatic.     Nose: Nose normal.  Eyes:     Conjunctiva/sclera: Conjunctivae normal.  Cardiovascular:     Rate and Rhythm: Normal rate.  Pulmonary:     Effort: Pulmonary effort is normal. No respiratory distress.  Abdominal:  General: There is no distension.     Comments: Tenderness to palpation of epigastrium and bilateral upper quadrants, no lower abdominal tenderness, no focal tenderness, negative rebound, negative Rovsing, negative McBurney's, negative Murphy's  Weakly positive CVA tenderness on right  Musculoskeletal:        General: Normal range of motion.     Cervical back: Neck supple.  Skin:    General: Skin is warm and dry.  Neurological:     Mental Status: She is alert and oriented to person, place, and time.      UC Treatments / Results  Labs (all labs ordered are listed, but only abnormal results are displayed) Labs Reviewed  POCT URINALYSIS DIP (DEVICE) - Abnormal; Notable for the following components:      Result Value   Hgb urine dipstick LARGE (*)    Protein, ur 100 (*)    Leukocytes,Ua TRACE (*)    All other components within normal limits  URINE CULTURE    EKG   Radiology No results found.  Procedures Procedures (including critical care time)  Medications Ordered in UC Medications - No data to display  Initial Impression / Assessment and Plan / UC Course  I have reviewed the triage vital signs and the nursing notes.  Pertinent labs & imaging results that were available during my care of the patient were reviewed by me and considered in my medical decision making (see chart for details).     Treating for UTI with Bactrim, covering for Pyelo although vital signs stable today.  Urine culture pending.  Last UTI 2 to 3 months ago.  Advised patient if having another UTI prior to fall she may need to follow-up with urology for recurrent UTIs.  Push fluids.  Discussed strict return precautions. Patient verbalized  understanding and is agreeable with plan.  Final Clinical Impressions(s) / UC Diagnoses   Final diagnoses:  Lower urinary tract infectious disease     Discharge Instructions     Urine showed evidence of infection. We are treating you with bactrim- twice daily for 1 week. Be sure to take full course. Stay hydrated- urine should be pale yellow to clear.   Please return or follow up with your primary provider if symptoms not improving with treatment. Please return sooner if you have worsening of symptoms or develop fever, nausea, vomiting, abdominal pain, back pain, lightheadedness, dizziness.    ED Prescriptions    Medication Sig Dispense Auth. Provider   sulfamethoxazole-trimethoprim (BACTRIM DS) 800-160 MG tablet Take 1 tablet by mouth 2 (two) times daily for 7 days. 14 tablet Severin Bou C, PA-C   ondansetron (ZOFRAN ODT) 4 MG disintegrating tablet Take 1 tablet (4 mg total) by mouth every 8 (eight) hours as needed for nausea or vomiting. 20 tablet Sammy Cassar C, PA-C   fluconazole (DIFLUCAN) 150 MG tablet Take 1 tablet (150 mg total) by mouth once for 1 dose. 2 tablet Kross Swallows, [place] C, PA-C     PDMP not reviewed this encounter.   Janith Lima, PA-C 03/11/20 1550

## 2020-03-12 LAB — URINE CULTURE

## 2020-05-20 ENCOUNTER — Other Ambulatory Visit: Payer: Self-pay | Admitting: Family Medicine

## 2020-05-23 ENCOUNTER — Other Ambulatory Visit: Payer: Self-pay | Admitting: Family Medicine

## 2020-05-28 ENCOUNTER — Other Ambulatory Visit: Payer: Self-pay | Admitting: Family Medicine

## 2020-05-28 DIAGNOSIS — Z1231 Encounter for screening mammogram for malignant neoplasm of breast: Secondary | ICD-10-CM

## 2020-05-31 ENCOUNTER — Other Ambulatory Visit: Payer: Self-pay

## 2020-05-31 ENCOUNTER — Encounter: Payer: Self-pay | Admitting: Family Medicine

## 2020-05-31 ENCOUNTER — Ambulatory Visit: Payer: Medicaid Other | Admitting: Family Medicine

## 2020-05-31 VITALS — BP 130/62 | HR 67 | Temp 97.9°F | Resp 18 | Ht 62.0 in | Wt 104.0 lb

## 2020-05-31 DIAGNOSIS — M48062 Spinal stenosis, lumbar region with neurogenic claudication: Secondary | ICD-10-CM

## 2020-05-31 DIAGNOSIS — Z0001 Encounter for general adult medical examination with abnormal findings: Secondary | ICD-10-CM | POA: Diagnosis not present

## 2020-05-31 DIAGNOSIS — E559 Vitamin D deficiency, unspecified: Secondary | ICD-10-CM | POA: Diagnosis not present

## 2020-05-31 DIAGNOSIS — M810 Age-related osteoporosis without current pathological fracture: Secondary | ICD-10-CM | POA: Diagnosis not present

## 2020-05-31 DIAGNOSIS — R399 Unspecified symptoms and signs involving the genitourinary system: Secondary | ICD-10-CM

## 2020-05-31 DIAGNOSIS — Z0000 Encounter for general adult medical examination without abnormal findings: Secondary | ICD-10-CM

## 2020-05-31 DIAGNOSIS — B070 Plantar wart: Secondary | ICD-10-CM

## 2020-05-31 DIAGNOSIS — E7800 Pure hypercholesterolemia, unspecified: Secondary | ICD-10-CM | POA: Diagnosis not present

## 2020-05-31 DIAGNOSIS — I10 Essential (primary) hypertension: Secondary | ICD-10-CM | POA: Diagnosis not present

## 2020-05-31 LAB — URINALYSIS, ROUTINE W REFLEX MICROSCOPIC
Bilirubin Urine: NEGATIVE
Glucose, UA: NEGATIVE
Hyaline Cast: NONE SEEN /LPF
Ketones, ur: NEGATIVE
Nitrite: NEGATIVE
Specific Gravity, Urine: 1.016 (ref 1.001–1.03)
pH: >9 — ABNORMAL HIGH (ref 5.0–8.0)

## 2020-05-31 LAB — MICROSCOPIC MESSAGE

## 2020-05-31 MED ORDER — PATADAY 0.2 % OP SOLN: OPHTHALMIC | 11 refills | Status: DC

## 2020-05-31 MED ORDER — SULFAMETHOXAZOLE-TRIMETHOPRIM 800-160 MG PO TABS: 1.0000 | ORAL_TABLET | Freq: Two times a day (BID) | ORAL | 0 refills | Status: DC

## 2020-05-31 NOTE — Progress Notes (Signed)
Subjective:    Patient ID: Heather Gillespie, female    DOB: [birth year], 60 y.o.   MRN: [id number]  HPI  Patient is here today for complete physical exam.  Patient reports pain in her right foot.  She has a plantar wart like lesion on the plantar aspect of the right foot.  There is a central pore.  It causes stabbing pain every time she walks on it.  Is roughly 1.5 cm in diameter.  She is due for a flu shot as well as the shingles vaccine.  She declines both of those due to her family history of Guillain-Barr syndrome in her mom.  She is also due for Covid shot.  I strongly encouraged her to get the Covid shot.  Her last colonoscopy was in 17 and was normal.  She does not need a repeat colonoscopy until 2027.  Her mammogram has already been scheduled for later in October.  Her Pap smear was performed last year and was normal and is not due again until 2023.  She had a bone density test last year that showed osteoporosis for which she takes Fosamax.  She is due for repeat bone density test next year.  She is taking at 1000 mg a day of vitamin D and 1200 mg a day of calcium Past Medical History:  Diagnosis Date  . Abdominal pain   . Allergy   . GERD (gastroesophageal reflux disease)   . Glaucoma   . Hemorrhoids   . Hiatal hernia   . IBS (irritable bowel syndrome)   . N&V (nausea and vomiting)   . Osteoporosis   . Rectal bleeding   . Rectal ulcer   . Sinus problem   . TMJ (dislocation of temporomandibular joint)   . Vitamin D deficiency    Past Surgical History:  Procedure Laterality Date  . ANAL FISSURE REPAIR    . BACK SURGERY    . CHOLECYSTECTOMY  1984  . COLONOSCOPY W/ POLYPECTOMY     colon ulcers  . EYE SURGERY    . HEMI-MICRODISCECTOMY LUMBAR LAMINECTOMY LEVEL 1 Left 05/26/2013   Procedure: HEMI-MICRODISCECTOMY LUMBAR LAMINECTOMY L4 - L5 ON THE LEFT LEVEL 1;  Surgeon: Tobi Bastos, MD;  Location: [facility];  Service: Orthopedics;  Laterality: Left;  . HEMORROIDECTOMY      Current Outpatient Medications on File Prior to Visit  Medication Sig Dispense Refill  . gabapentin (NEURONTIN) 300 MG capsule TAKE 1 CAPSULE BY MOUTH THREE TIMES A DAY 90 capsule 0  . alendronate (FOSAMAX) 70 MG tablet TAKE 1 TABLET BY MOUTH EVERY 7 DAYS. TAKE WITH A FULL GLASS OF WATER ON AN EMPTY STOMACH. 4 tablet 11  . dexlansoprazole (DEXILANT) 60 MG capsule Take 60 mg by mouth daily.    . Ergocalciferol (VITAMIN D2) 2000 units TABS Take 1 tablet by mouth daily.    . fexofenadine (ALLEGRA) 180 MG tablet TAKE 1 TABLET BY MOUTH EVERY DAY 30 tablet 11  . fluticasone (FLONASE) 50 MCG/ACT nasal spray PLACE 2 SPRAYS INTO EACH NOSTRIL ONCE DAILY 16 mL 6  . hydrochlorothiazide (HYDRODIURIL) 25 MG tablet TAKE 1 TABLET BY MOUTH EVERY DAY 30 tablet 11  . losartan (COZAAR) 100 MG tablet TAKE 1 TABLET BY MOUTH EVERY DAY 90 tablet 2  . nitrofurantoin, macrocrystal-monohydrate, (MACROBID) 100 MG capsule Take 1 capsule (100 mg total) by mouth 2 (two) times daily. 14 capsule 0  . ondansetron (ZOFRAN ODT) 4 MG disintegrating tablet Take 1 tablet (4 mg total) by  mouth every 8 (eight) hours as needed for nausea or vomiting. 20 tablet 0  . PATADAY 0.2 % SOLN INSTILL 1 DROP INTO AFFECTED EYE EVERY DAY 2.5 mL 11  . PAZEO 0.7 % SOLN INSTILL 1 DROP INTO BOTH EYES EVERY DAY 2.5 mL 3   No current facility-administered medications on file prior to visit.   Allergies  Allergen Reactions  . Hydroxyzine Pamoate Rash   Social History   Socioeconomic History  . Marital status: Single    Spouse name: Not on file  . Number of children: Not on file  . Years of education: Not on file  . Highest education level: Not on file  Occupational History  . Not on file  Tobacco Use  . Smoking status: Never Smoker  . Smokeless tobacco: Never Used  Vaping Use  . Vaping Use: Never used  Substance and Sexual Activity  . Alcohol use: No  . Drug use: No  . Sexual activity: Not Currently  Other Topics Concern  . Not on  file  Social History Narrative  . Not on file   Social Determinants of Health   Financial Resource Strain:   . Difficulty of Paying Living Expenses: Not on file  Food Insecurity:   . Worried About Charity fundraiser in the Last Year: Not on file  . Ran Out of Food in the Last Year: Not on file  Transportation Needs:   . Lack of Transportation (Medical): Not on file  . Lack of Transportation (Non-Medical): Not on file  Physical Activity:   . Days of Exercise per Week: Not on file  . Minutes of Exercise per Session: Not on file  Stress:   . Feeling of Stress : Not on file  Social Connections:   . Frequency of Communication with Friends and Family: Not on file  . Frequency of Social Gatherings with Friends and Family: Not on file  . Attends Religious Services: Not on file  . Active Member of Clubs or Organizations: Not on file  . Attends Archivist Meetings: Not on file  . Marital Status: Not on file  Intimate Partner Violence:   . Fear of Current or Ex-Partner: Not on file  . Emotionally Abused: Not on file  . Physically Abused: Not on file  . Sexually Abused: Not on file   Family History  Problem Relation Age of Onset  . Diabetes Mother   . Cancer Mother   . Stroke Father   . Breast cancer Neg Hx       Review of Systems  All other systems reviewed and are negative.      Objective:   Physical Exam Vitals reviewed.  Constitutional:      General: She is not in acute distress.    Appearance: She is well-developed. She is not diaphoretic.  HENT:     Head: Normocephalic and atraumatic.     Right Ear: External ear normal.     Left Ear: External ear normal.     Nose: Nose normal.     Mouth/Throat:     Pharynx: No oropharyngeal exudate.  Eyes:     General: No scleral icterus.       Right eye: No discharge.        Left eye: No discharge.     Conjunctiva/sclera: Conjunctivae normal.     Pupils: Pupils are equal, round, and reactive to light.  Neck:      Thyroid: No thyromegaly.     Vascular: No  JVD.     Trachea: No tracheal deviation.  Cardiovascular:     Rate and Rhythm: Normal rate and regular rhythm.     Heart sounds: Normal heart sounds. No murmur heard.  No friction rub. No gallop.   Pulmonary:     Effort: Pulmonary effort is normal. No respiratory distress.     Breath sounds: Normal breath sounds. No stridor. No wheezing or rales.  Chest:     Chest wall: No tenderness.  Abdominal:     General: Bowel sounds are normal. There is no distension.     Palpations: Abdomen is soft. There is no mass.     Tenderness: There is no abdominal tenderness. There is no guarding or rebound.  Genitourinary:    Cervix: No cervical motion tenderness, discharge or friability.     Adnexa:        Right: No mass or tenderness.         Left: No mass or tenderness.    Musculoskeletal:        General: No tenderness. Normal range of motion.     Cervical back: Normal range of motion and neck supple.  Lymphadenopathy:     Cervical: No cervical adenopathy.  Skin:    General: Skin is warm.     Coloration: Skin is not pale.     Findings: No erythema or rash.  Neurological:     Mental Status: She is alert and oriented to person, place, and time.     Cranial Nerves: No cranial nerve deficit.     Motor: No abnormal muscle tone.     Coordination: Coordination normal.     Deep Tendon Reflexes: Reflexes are normal and symmetric.  Psychiatric:        Behavior: Behavior normal.        Thought Content: Thought content normal.        Judgment: Judgment normal.           Assessment & Plan:  General medical exam - Plan: CBC with Differential/Platelet, COMPLETE METABOLIC PANEL WITH GFR, Lipid panel  Osteoporosis, unspecified osteoporosis type, unspecified pathological fracture presence  Vitamin D deficiency  Benign essential HTN - Plan: CBC with Differential/Platelet, COMPLETE METABOLIC PANEL WITH GFR, Lipid panel  Pure  hypercholesterolemia  Spinal stenosis, lumbar region, with neurogenic claudication  UTI symptoms - Plan: Urinalysis, Routine w reflex microscopic  Plantar wart of both feet - Plan: Ambulatory referral to Podiatry  I will consult the podiatrist to remove the plantar wart especially from the bottom of her right foot which is extremely painful.  She also reports 1 week history of dysuria, urgency, and frequency.  She has +1 leukocyte esterase and +2 protein and +1 blood.  Therefore I will treat her for urinary tract infection with Bactrim double strength tablets twice daily for 5 days.  Check CBC, CMP, fasting lipid panel.  Recommended the Covid vaccination.  Patient declined the flu shot and the shingles vaccine.  Colonoscopy is up-to-date.  Pap smear is up-to-date.  Mammogram has been scheduled for late in October.

## 2020-06-01 ENCOUNTER — Other Ambulatory Visit: Payer: Self-pay | Admitting: Family Medicine

## 2020-06-01 ENCOUNTER — Telehealth: Payer: Self-pay

## 2020-06-01 ENCOUNTER — Other Ambulatory Visit: Payer: Self-pay

## 2020-06-01 LAB — COMPLETE METABOLIC PANEL WITH GFR
AG Ratio: 1.9 (calc) (ref 1.0–2.5)
ALT: 13 U/L (ref 6–29)
AST: 16 U/L (ref 10–35)
Albumin: 4.6 g/dL (ref 3.6–5.1)
Alkaline phosphatase (APISO): 50 U/L (ref 37–153)
BUN: 14 mg/dL (ref 7–25)
CO2: 26 mmol/L (ref 20–32)
Calcium: 9.9 mg/dL (ref 8.6–10.4)
Chloride: 103 mmol/L (ref 98–110)
Creat: 0.8 mg/dL (ref 0.50–1.05)
GFR, Est African American: 94 mL/min/{1.73_m2} (ref >=60)
GFR, Est Non African American: 81 mL/min/{1.73_m2} (ref >=60)
Globulin: 2.4 g/dL (calc) (ref 1.9–3.7)
Glucose, Bld: 76 mg/dL (ref 65–99)
Potassium: 3.8 mmol/L (ref 3.5–5.3)
Sodium: 140 mmol/L (ref 135–146)
Total Bilirubin: 1 mg/dL (ref 0.2–1.2)
Total Protein: 7 g/dL (ref 6.1–8.1)

## 2020-06-01 LAB — CBC WITH DIFFERENTIAL/PLATELET
Absolute Monocytes: 284 cells/uL (ref 200–950)
Basophils Absolute: 21 cells/uL (ref 0–200)
Basophils Relative: 0.6 %
Eosinophils Absolute: 21 cells/uL (ref 15–500)
Eosinophils Relative: 0.6 %
HCT: 36.7 % (ref 35.0–45.0)
Hemoglobin: 12.2 g/dL (ref 11.7–15.5)
Lymphs Abs: 1211 cells/uL (ref 850–3900)
MCH: 31.3 pg (ref 27.0–33.0)
MCHC: 33.2 g/dL (ref 32.0–36.0)
MCV: 94.1 fL (ref 80.0–100.0)
MPV: 11.5 fL (ref 7.5–12.5)
Monocytes Relative: 8.1 %
Neutro Abs: 1964 cells/uL (ref 1500–7800)
Neutrophils Relative %: 56.1 %
Platelets: 168 10*3/uL (ref 140–400)
RBC: 3.9 10*6/uL (ref 3.80–5.10)
RDW: 12.2 % (ref 11.0–15.0)
Total Lymphocyte: 34.6 %
WBC: 3.5 10*3/uL — ABNORMAL LOW (ref 3.8–10.8)

## 2020-06-01 LAB — LIPID PANEL
Cholesterol: 170 mg/dL (ref <200)
HDL: 63 mg/dL (ref >=50)
LDL Cholesterol (Calc): 93 mg/dL (calc)
Non-HDL Cholesterol (Calc): 107 mg/dL (calc) (ref <130)
Total CHOL/HDL Ratio: 2.7 (calc) (ref <5.0)
Triglycerides: 54 mg/dL (ref <150)

## 2020-06-01 MED ORDER — FLUCONAZOLE 150 MG PO TABS: 150.0000 mg | ORAL_TABLET | Freq: Once | ORAL | 0 refills | Status: AC

## 2020-06-01 MED ORDER — PAZEO 0.7 % OP SOLN: OPHTHALMIC | 3 refills | Status: DC

## 2020-06-01 MED ORDER — FLUCONAZOLE 150 MG PO TABS
150.0000 mg | ORAL_TABLET | Freq: Once | ORAL | 0 refills | Status: DC
Start: 2020-06-01 — End: 2020-06-01

## 2020-06-01 NOTE — Telephone Encounter (Signed)
yes

## 2020-06-01 NOTE — Telephone Encounter (Signed)
Pt called wanting to know if you can send her an Rx for diflucan due to taking an antibiotic. Pt is scared of getting a yeast infection. Pt states the yeast infections are bad when she gets them. Please advise.

## 2020-06-01 NOTE — Telephone Encounter (Signed)
Rx sent 

## 2020-06-04 ENCOUNTER — Other Ambulatory Visit: Payer: Self-pay

## 2020-06-04 MED ORDER — PAZEO 0.7 % OP SOLN: OPHTHALMIC | 3 refills | Status: DC

## 2020-06-07 ENCOUNTER — Other Ambulatory Visit: Payer: Self-pay

## 2020-06-07 ENCOUNTER — Encounter: Payer: Self-pay | Admitting: Podiatry

## 2020-06-07 ENCOUNTER — Ambulatory Visit: Payer: Medicaid Other | Admitting: Podiatry

## 2020-06-07 DIAGNOSIS — D492 Neoplasm of unspecified behavior of bone, soft tissue, and skin: Secondary | ICD-10-CM | POA: Diagnosis not present

## 2020-06-07 DIAGNOSIS — Q828 Other specified congenital malformations of skin: Secondary | ICD-10-CM

## 2020-06-07 NOTE — Progress Notes (Signed)
Subjective:  Patient ID: Heather Gillespie, female    DOB: May 20, 1961,  MRN: [id number]  Chief Complaint  Patient presents with  . Plantar Warts    Pt was seen by PCP- mentioned she has wart B/L- stated she needed to have them removed- further evalaution     59 y.o. female presents with the above complaint.  Patient presents with bilateral submetatarsal 5 benign skin lesion/porokeratosis.  Patient states that they are painful to touch.  She would like to have them debrided down.  She states that she is walking a lot on them.  She has not tried anything else.  She would like to discuss various treatment options.  She has not seen anyone else prior to seeing me.  She denies any other acute complaints.   Review of Systems: Negative except as noted in the HPI. Denies N/V/F/Ch.  Past Medical History:  Diagnosis Date  . Abdominal pain   . Allergy   . GERD (gastroesophageal reflux disease)   . Glaucoma   . Hemorrhoids   . Hiatal hernia   . IBS (irritable bowel syndrome)   . N&V (nausea and vomiting)   . Osteoporosis   . Rectal bleeding   . Rectal ulcer   . Sinus problem   . TMJ (dislocation of temporomandibular joint)   . Vitamin D deficiency     Current Outpatient Medications:  .  alendronate (FOSAMAX) 70 MG tablet, TAKE 1 TABLET BY MOUTH EVERY 7 DAYS. TAKE WITH A FULL GLASS OF WATER ON AN EMPTY STOMACH., Disp: 4 tablet, Rfl: 11 .  DENTA 5000 PLUS 1.1 % CREA dental cream, Take by mouth 2 (two) times daily., Disp: , Rfl:  .  dexlansoprazole (DEXILANT) 60 MG capsule, Take 60 mg by mouth daily., Disp: , Rfl:  .  Ergocalciferol (VITAMIN D2) 2000 units TABS, Take 1 tablet by mouth daily., Disp: , Rfl:  .  fexofenadine (ALLEGRA) 180 MG tablet, TAKE 1 TABLET BY MOUTH EVERY DAY, Disp: 30 tablet, Rfl: 11 .  fluconazole (DIFLUCAN) 150 MG tablet, Take 150 mg by mouth once., Disp: , Rfl:  .  fluticasone (FLONASE) 50 MCG/ACT nasal spray, PLACE 2 SPRAYS INTO EACH NOSTRIL ONCE DAILY, Disp: 16 mL,  Rfl: 6 .  gabapentin (NEURONTIN) 300 MG capsule, TAKE 1 CAPSULE BY MOUTH THREE TIMES A DAY, Disp: 90 capsule, Rfl: 0 .  hydrochlorothiazide (HYDRODIURIL) 25 MG tablet, TAKE 1 TABLET BY MOUTH EVERY DAY, Disp: 30 tablet, Rfl: 11 .  losartan (COZAAR) 100 MG tablet, TAKE 1 TABLET BY MOUTH EVERY DAY, Disp: 90 tablet, Rfl: 2 .  Olopatadine HCl (PAZEO) 0.7 % SOLN, INSTILL 1 DROP INTO BOTH EYES EVERY DAY, Disp: 2.5 mL, Rfl: 3 .  PATADAY 0.2 % SOLN, INSTILL 1 DROP INTO AFFECTED EYE EVERY DAY, Disp: 2.5 mL, Rfl: 11 .  sulfamethoxazole-trimethoprim (BACTRIM DS) 800-160 MG tablet, Take 1 tablet by mouth 2 (two) times daily., Disp: 10 tablet, Rfl: 0  Social History   Tobacco Use  Smoking Status Never Smoker  Smokeless Tobacco Never Used    Allergies  Allergen Reactions  . Hydroxyzine Pamoate Rash   Objective:  There were no vitals filed for this visit. There is no height or weight on file to calculate BMI. Constitutional Well developed. Well nourished.  Vascular Dorsalis pedis pulses palpable bilaterally. Posterior tibial pulses palpable bilaterally. Capillary refill normal to all digits.  No cyanosis or clubbing noted. Pedal hair growth normal.  Neurologic Normal speech. Oriented to person, place, and time. Epicritic  sensation to light touch grossly present bilaterally.  Dermatologic  hyperkeratotic lesion noted to bilateral submetatarsal 5 with central nucleated core.  No pinpoint bleeding noted.  Orthopedic: Normal joint ROM without pain or crepitus bilaterally. No visible deformities. No bony tenderness.   Radiographs: None Assessment:   1. Porokeratosis    Plan:  Patient was evaluated and treated and all questions answered.  Bilateral submetatarsal 5 porokeratosis/benign skin lesion -I explained patient the etiology of benign skin lesion/porokeratosis and various treatment options were discussed.  I believe patient will benefit from aggressive offloading of the lesions.  If  there is no improvement will consider doing steroid injection versus Cantharone therapy to help destroy the lesions.  Patient agrees with the plan.  No follow-ups on file.

## 2020-06-08 ENCOUNTER — Other Ambulatory Visit: Payer: Self-pay | Admitting: Family Medicine

## 2020-06-08 ENCOUNTER — Telehealth: Payer: Self-pay | Admitting: *Deleted

## 2020-06-08 MED ORDER — CROMOLYN SODIUM 4 % OP SOLN: 1.0000 [drp] | Freq: Four times a day (QID) | OPHTHALMIC | 12 refills | Status: DC

## 2020-06-08 NOTE — Telephone Encounter (Signed)
I sent in cromolyn

## 2020-06-08 NOTE — Telephone Encounter (Signed)
Received fax requesting alternative to Pataday 0.2% gtts.   Reports that insurance does not cover brand name, and generic is OTC. Reports that patient does not want to pay OTC.   Preferred alternatives listed are as follows: Pazeo Cromolyn  MD please advise.

## 2020-06-21 ENCOUNTER — Other Ambulatory Visit: Payer: Self-pay | Admitting: Family Medicine

## 2020-07-06 ENCOUNTER — Other Ambulatory Visit: Payer: Self-pay

## 2020-07-06 ENCOUNTER — Ambulatory Visit (INDEPENDENT_AMBULATORY_CARE_PROVIDER_SITE_OTHER): Payer: Medicaid Other | Admitting: Podiatry

## 2020-07-06 DIAGNOSIS — M7751 Other enthesopathy of right foot: Secondary | ICD-10-CM | POA: Diagnosis not present

## 2020-07-06 DIAGNOSIS — M7752 Other enthesopathy of left foot: Secondary | ICD-10-CM | POA: Diagnosis not present

## 2020-07-06 DIAGNOSIS — Q828 Other specified congenital malformations of skin: Secondary | ICD-10-CM

## 2020-07-07 ENCOUNTER — Other Ambulatory Visit: Payer: Self-pay | Admitting: Family Medicine

## 2020-07-09 ENCOUNTER — Ambulatory Visit
Admission: RE | Admit: 2020-07-09 | Discharge: 2020-07-09 | Disposition: A | Discharge status: Home / Self Care | Payer: Medicaid Other | Source: Ambulatory Visit | Attending: Family Medicine | Admitting: Family Medicine

## 2020-07-09 ENCOUNTER — Other Ambulatory Visit: Payer: Self-pay

## 2020-07-09 DIAGNOSIS — Z1231 Encounter for screening mammogram for malignant neoplasm of breast: Secondary | ICD-10-CM | POA: Diagnosis not present

## 2020-07-10 ENCOUNTER — Encounter: Payer: Self-pay | Admitting: Podiatry

## 2020-07-10 NOTE — Progress Notes (Signed)
Subjective:  Patient ID: Heather Gillespie, female    DOB: 1961/04/23,  MRN: [id number]  Chief Complaint  Patient presents with  . Callouses    Bilateral callous     59 y.o. female presents with the above complaint.  Patient presents with bilateral submetatarsal 5 benign skin lesion/porokeratosis.  Patient states that he is doing a little bit better however she still has pain and the debridement did help last for a little while longer.  She denies any other acute complaints.  She would like to discuss treatment options for this.   Review of Systems: Negative except as noted in the HPI. Denies N/V/F/Ch.  Past Medical History:  Diagnosis Date  . Abdominal pain   . Allergy   . GERD (gastroesophageal reflux disease)   . Glaucoma   . Hemorrhoids   . Hiatal hernia   . IBS (irritable bowel syndrome)   . N&V (nausea and vomiting)   . Osteoporosis   . Rectal bleeding   . Rectal ulcer   . Sinus problem   . TMJ (dislocation of temporomandibular joint)   . Vitamin D deficiency     Current Outpatient Medications:  .  alendronate (FOSAMAX) 70 MG tablet, TAKE 1 TABLET BY MOUTH EVERY 7 DAYS. TAKE WITH A FULL GLASS OF WATER ON AN EMPTY STOMACH., Disp: 4 tablet, Rfl: 11 .  cromolyn (OPTICROM) 4 % ophthalmic solution, Place 1 drop into both eyes 4 (four) times daily., Disp: 10 mL, Rfl: 12 .  DENTA 5000 PLUS 1.1 % CREA dental cream, Take by mouth 2 (two) times daily., Disp: , Rfl:  .  dexlansoprazole (DEXILANT) 60 MG capsule, Take 60 mg by mouth daily., Disp: , Rfl:  .  Ergocalciferol (VITAMIN D2) 2000 units TABS, Take 1 tablet by mouth daily., Disp: , Rfl:  .  fexofenadine (ALLEGRA) 180 MG tablet, TAKE 1 TABLET BY MOUTH EVERY DAY, Disp: 30 tablet, Rfl: 11 .  fluconazole (DIFLUCAN) 150 MG tablet, Take 150 mg by mouth once., Disp: , Rfl:  .  fluticasone (FLONASE) 50 MCG/ACT nasal spray, PLACE 2 SPRAYS INTO EACH NOSTRIL ONCE DAILY, Disp: 16 mL, Rfl: 6 .  gabapentin (NEURONTIN) 300 MG capsule,  TAKE 1 CAPSULE BY MOUTH THREE TIMES A DAY, Disp: 90 capsule, Rfl: 0 .  hydrochlorothiazide (HYDRODIURIL) 25 MG tablet, TAKE 1 TABLET BY MOUTH EVERY DAY, Disp: 30 tablet, Rfl: 11 .  losartan (COZAAR) 100 MG tablet, TAKE 1 TABLET BY MOUTH EVERY DAY, Disp: 90 tablet, Rfl: 2 .  Olopatadine HCl (PAZEO) 0.7 % SOLN, INSTILL 1 DROP INTO BOTH EYES EVERY DAY, Disp: 2.5 mL, Rfl: 3 .  PATADAY 0.2 % SOLN, INSTILL 1 DROP INTO AFFECTED EYE EVERY DAY, Disp: 2.5 mL, Rfl: 11 .  sulfamethoxazole-trimethoprim (BACTRIM DS) 800-160 MG tablet, Take 1 tablet by mouth 2 (two) times daily., Disp: 10 tablet, Rfl: 0  Social History   Tobacco Use  Smoking Status Never Smoker  Smokeless Tobacco Never Used    Allergies  Allergen Reactions  . Hydroxyzine Pamoate Rash   Objective:  There were no vitals filed for this visit. There is no height or weight on file to calculate BMI. Constitutional Well developed. Well nourished.  Vascular Dorsalis pedis pulses palpable bilaterally. Posterior tibial pulses palpable bilaterally. Capillary refill normal to all digits.  No cyanosis or clubbing noted. Pedal hair growth normal.  Neurologic Normal speech. Oriented to person, place, and time. Epicritic sensation to light touch grossly present bilaterally.  Dermatologic  hyperkeratotic lesion noted to  bilateral submetatarsal 5 with central nucleated core.  No pinpoint bleeding noted.  Orthopedic: Normal joint ROM without pain or crepitus bilaterally. No visible deformities. No bony tenderness.   Radiographs: None Assessment:   1. Capsulitis of metatarsophalangeal (MTP) joint of right foot   2. Capsulitis of metatarsophalangeal (MTP) joint of left foot   3. Porokeratosis    Plan:  Patient was evaluated and treated and all questions answered.  Bilateral submetatarsal 5 porokeratosis/benign skin lesion with underlying capsulitis -I explained patient the etiology of benign skin lesion/porokeratosis and various treatment  options were discussed.  Given that patient still having some pain I believe she will benefit from steroid injection to help decrease the acute inflammatory component associated pain.  Patient agrees with plan like to proceed with a steroid injection followed by aggressive debridement of the lesion. -A steroid injection was performed at bilateral submet 5 using 1% plain Lidocaine and 10 mg of Kenalog. This was well tolerated. -I will discuss orthotics management if there is an improvement.  No follow-ups on file.

## 2020-07-28 ENCOUNTER — Other Ambulatory Visit: Payer: Self-pay | Admitting: Family Medicine

## 2020-07-30 NOTE — Telephone Encounter (Signed)
Please Advise

## 2020-08-17 ENCOUNTER — Other Ambulatory Visit: Payer: Self-pay

## 2020-08-17 ENCOUNTER — Ambulatory Visit (INDEPENDENT_AMBULATORY_CARE_PROVIDER_SITE_OTHER): Payer: Medicaid Other | Admitting: Podiatry

## 2020-08-17 DIAGNOSIS — M7752 Other enthesopathy of left foot: Secondary | ICD-10-CM | POA: Diagnosis not present

## 2020-08-17 DIAGNOSIS — Q666 Other congenital valgus deformities of feet: Secondary | ICD-10-CM | POA: Diagnosis not present

## 2020-08-17 DIAGNOSIS — M7751 Other enthesopathy of right foot: Secondary | ICD-10-CM | POA: Diagnosis not present

## 2020-08-19 ENCOUNTER — Other Ambulatory Visit: Payer: Self-pay

## 2020-08-19 ENCOUNTER — Encounter (HOSPITAL_COMMUNITY): Payer: Self-pay | Admitting: *Deleted

## 2020-08-19 ENCOUNTER — Ambulatory Visit (HOSPITAL_COMMUNITY)
Admission: EM | Admit: 2020-08-19 | Discharge: 2020-08-19 | Disposition: A | Discharge status: Home / Self Care | Payer: Medicaid Other | Attending: Family Medicine | Admitting: Family Medicine

## 2020-08-19 ENCOUNTER — Other Ambulatory Visit: Payer: Self-pay | Admitting: Family Medicine

## 2020-08-19 DIAGNOSIS — N390 Urinary tract infection, site not specified: Secondary | ICD-10-CM | POA: Diagnosis not present

## 2020-08-19 LAB — POCT URINALYSIS DIPSTICK, ED / UC
Bilirubin Urine: NEGATIVE
Glucose, UA: NEGATIVE mg/dL
Ketones, ur: NEGATIVE mg/dL
Leukocytes,Ua: NEGATIVE
Nitrite: NEGATIVE
Protein, ur: 100 mg/dL — AB
Specific Gravity, Urine: 1.02 (ref 1.005–1.030)
Urobilinogen, UA: 0.2 mg/dL (ref 0.0–1.0)
pH: 6 (ref 5.0–8.0)

## 2020-08-19 MED ORDER — CEPHALEXIN 500 MG PO CAPS: 500.0000 mg | ORAL_CAPSULE | Freq: Two times a day (BID) | ORAL | 0 refills | Status: AC

## 2020-08-19 NOTE — ED Triage Notes (Signed)
C/O dysuria and polyuria x approx 2 wks with progressive worsening.  Now also c/o hematuria, back and abd pain.  States has felt feverish.  C/O nausea, denies vomiting.

## 2020-08-19 NOTE — ED Provider Notes (Signed)
[place]    CSN: [id number] Arrival date & time: 08/19/20  1145      History   Chief Complaint Chief Complaint  Patient presents with  . Dysuria    HPI Heather Gillespie is a 59 y.o. female.   Patient presents with 2-week history of dysuria and frequency.  She reports her symptoms are getting worse and she now has abdominal pain, back pain, hematuria, and nausea.  She states she has felt warm but has not checked her temperature.  She denies rash, vomiting, diarrhea, constipation, genital discharge, pelvic pain, or other symptoms.  Her medical history includes Munchhausen syndrome, IBS, osteoporosis, spinal stenosis.  The history is provided by the patient and medical records.    Past Medical History:  Diagnosis Date  . Abdominal pain   . Allergy   . GERD (gastroesophageal reflux disease)   . Glaucoma   . Hemorrhoids   . Hiatal hernia   . IBS (irritable bowel syndrome)   . N&V (nausea and vomiting)   . Osteoporosis   . Rectal bleeding   . Rectal ulcer   . Sinus problem   . TMJ (dislocation of temporomandibular joint)   . Vitamin D deficiency     Patient Active Problem List   Diagnosis Date Noted  . Spinal stenosis, lumbar region, with neurogenic claudication 05/26/2013  . 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 Vitamin D deficiency   . TMJ (dislocation of temporomandibular joint)   . Osteoporosis   . Rectal pain 03/31/2011  . ANEMIA, IRON DEFICIENCY NOS 02/19/2007  . [place] SYNDROME 02/19/2007  . HEMORRHOIDS NOS, W/O COMPLICATIONS 07/37/1062  . RHINITIS, ALLERGIC NOS 02/19/2007  . IBS 02/19/2007  . DIARRHEA, CHRONIC 02/19/2007  . UTI (urinary tract infection) 02/19/2007    Past Surgical History:  Procedure Laterality Date  . ANAL FISSURE REPAIR    . BACK SURGERY    . CHOLECYSTECTOMY  1984  . COLONOSCOPY W/ POLYPECTOMY     colon ulcers  . EYE SURGERY    . HEMI-MICRODISCECTOMY LUMBAR LAMINECTOMY  LEVEL 1 Left 05/26/2013   Procedure: HEMI-MICRODISCECTOMY LUMBAR LAMINECTOMY L4 - L5 ON THE LEFT LEVEL 1;  Surgeon: Tobi Bastos, MD;  Location: [facility];  Service: Orthopedics;  Laterality: Left;  . HEMORROIDECTOMY      OB History   No obstetric history on file.      Home Medications    Prior to Admission medications   Medication Sig Start Date End Date Taking? Authorizing Provider  alendronate (FOSAMAX) 70 MG tablet TAKE 1 TABLET BY MOUTH EVERY 7 DAYS. TAKE WITH A FULL GLASS OF WATER ON AN EMPTY STOMACH. 02/27/20  Yes Susy Frizzle, MD  cromolyn (OPTICROM) 4 % ophthalmic solution Place 1 drop into both eyes 4 (four) times daily. 06/08/20  Yes Susy Frizzle, MD  DENTA 5000 PLUS 1.1 % CREA dental cream Take by mouth 2 (two) times daily. 05/15/20  Yes [provider]  dexlansoprazole (DEXILANT) 60 MG capsule Take 60 mg by mouth daily.   Yes [provider]  Ergocalciferol (VITAMIN D2) 2000 units TABS Take 1 tablet by mouth daily.   Yes [provider]  fexofenadine (ALLEGRA) 180 MG tablet TAKE 1 TABLET BY MOUTH EVERY DAY 01/23/20  Yes Susy Frizzle, MD  fluticasone [facility]) 50 MCG/ACT nasal spray PLACE 2 SPRAYS INTO EACH NOSTRIL ONCE DAILY 05/23/20  Yes Susy Frizzle, MD  gabapentin (NEURONTIN) 300 MG capsule TAKE 1 CAPSULE  BY MOUTH THREE TIMES A DAY 07/30/20  Yes Susy Frizzle, MD  hydrochlorothiazide (HYDRODIURIL) 25 MG tablet TAKE 1 TABLET BY MOUTH EVERY DAY 02/08/20  Yes Susy Frizzle, MD  losartan (COZAAR) 100 MG tablet TAKE 1 TABLET BY MOUTH EVERY DAY 01/02/20  Yes Susy Frizzle, MD  Olopatadine HCl (PAZEO) 0.7 % SOLN INSTILL 1 DROP INTO BOTH EYES EVERY DAY 06/04/20  Yes Susy Frizzle, MD  PATADAY 0.2 % SOLN INSTILL 1 DROP INTO AFFECTED EYE EVERY DAY 05/31/20  Yes Susy Frizzle, MD  cephALEXin (KEFLEX) 500 MG capsule Take 1 capsule (500 mg total) by mouth 2 (two) times daily for 5 days. 08/19/20 08/24/20  Sharion Balloon, NP   fluconazole (DIFLUCAN) 150 MG tablet Take 150 mg by mouth once. 06/02/20   [provider]  sulfamethoxazole-trimethoprim (BACTRIM DS) 800-160 MG tablet Take 1 tablet by mouth 2 (two) times daily. 05/31/20   Susy Frizzle, MD    Family History Family History  Problem Relation Age of Onset  . Diabetes Mother   . Cancer Mother   . Stroke Father   . Breast cancer Neg Hx     Social History Social History   Tobacco Use  . Smoking status: Never Smoker  . Smokeless tobacco: Never Used  Vaping Use  . Vaping Use: Never used  Substance Use Topics  . Alcohol use: No  . Drug use: No     Allergies   Hydroxyzine pamoate   Review of Systems Review of Systems  Constitutional: Negative for chills and fever.  HENT: Negative for ear pain and sore throat.   Eyes: Negative for pain and visual disturbance.  Respiratory: Negative for cough and shortness of breath.   Cardiovascular: Negative for chest pain and palpitations.  Gastrointestinal: Positive for abdominal pain and nausea. Negative for constipation, diarrhea and vomiting.  Genitourinary: Positive for dysuria and frequency. Negative for hematuria, pelvic pain and vaginal discharge.  Musculoskeletal: Positive for back pain. Negative for arthralgias.  Skin: Negative for color change and rash.  Neurological: Negative for seizures and syncope.  All other systems reviewed and are negative.    Physical Exam Triage Vital Signs ED Triage Vitals [08/19/20 1253]  Enc Vitals Group     BP (!) 102/54     Pulse Rate 65     Resp 18     Temp 97.8 F (36.6 C)     Temp Source Oral     SpO2 99 %     Weight      Height      Head Circumference      Peak Flow      Pain Score 10     Pain Loc      Pain Edu?      Excl. in [place]?    No data found.  Updated Vital Signs BP (!) 102/54   Pulse 65   Temp 97.8 F (36.6 C) (Oral)   Resp 18   SpO2 99%   Visual Acuity Right Eye Distance:   Left Eye Distance:   Bilateral  Distance:    Right Eye Near:   Left Eye Near:    Bilateral Near:     Physical Exam Vitals and nursing note reviewed.  Constitutional:      General: She is not in acute distress.    Appearance: She is well-developed. She is not ill-appearing.  HENT:     Head: Normocephalic and atraumatic.     Mouth/Throat:  Mouth: Mucous membranes are moist.  Eyes:     Conjunctiva/sclera: Conjunctivae normal.  Cardiovascular:     Rate and Rhythm: Normal rate and regular rhythm.     Heart sounds: No murmur heard.   Pulmonary:     Effort: Pulmonary effort is normal. No respiratory distress.     Breath sounds: Normal breath sounds.  Abdominal:     General: Bowel sounds are normal.     Palpations: Abdomen is soft.     Tenderness: There is generalized abdominal tenderness. There is no right CVA tenderness, left CVA tenderness, guarding or rebound.  Musculoskeletal:     Cervical back: Neck supple.  Skin:    General: Skin is warm and dry.     Findings: No rash.  Neurological:     General: No focal deficit present.     Mental Status: She is alert and oriented to person, place, and time.     Gait: Gait normal.  Psychiatric:        Mood and Affect: Mood normal.        Behavior: Behavior normal.      UC Treatments / Results  Labs (all labs ordered are listed, but only abnormal results are displayed) Labs Reviewed  POCT URINALYSIS DIPSTICK, ED / UC - Abnormal; Notable for the following components:      Result Value   Hgb urine dipstick LARGE (*)    Protein, ur 100 (*)    All other components within normal limits  URINE CULTURE    EKG   Radiology No results found.  Procedures Procedures (including critical care time)  Medications Ordered in UC Medications - No data to display  Initial Impression / Assessment and Plan / UC Course  I have reviewed the triage vital signs and the nursing notes.  Pertinent labs & imaging results that were available during my care of the patient  were reviewed by me and considered in my medical decision making (see chart for details).   Lower UTI.  Treating with Keflex.  Urine culture pending.  Discussed that we will call her if the culture shows the need to change or discontinue her antibiotic.  Instructed patient to follow-up with her PCP in 1 to 2 weeks for recheck of her urine due to recurrent UTIs.  She agrees to plan of care.   Final Clinical Impressions(s) / UC Diagnoses   Final diagnoses:  Lower urinary tract infectious disease     Discharge Instructions     Take the antibiotic as directed.  The urine culture is pending.  We will call you if it shows the need to change or discontinue your antibiotic.    Follow up with your primary care provider if your symptoms are not improving.        ED Prescriptions    Medication Sig Dispense Auth. Provider   cephALEXin (KEFLEX) 500 MG capsule Take 1 capsule (500 mg total) by mouth 2 (two) times daily for 5 days. 10 capsule Sharion Balloon, NP     PDMP not reviewed this encounter.   Sharion Balloon, NP 08/19/20 1402

## 2020-08-19 NOTE — Discharge Instructions (Signed)
Take the antibiotic as directed.  The urine culture is pending.  We will call you if it shows the need to change or discontinue your antibiotic.    Follow up with your primary care provider if your symptoms are not improving.    

## 2020-08-20 ENCOUNTER — Encounter: Payer: Self-pay | Admitting: Podiatry

## 2020-08-20 LAB — URINE CULTURE

## 2020-08-20 MED ORDER — TRIAMCINOLONE ACETONIDE 10 MG/ML IJ SUSP
10.0000 mg | Freq: Once | INTRAMUSCULAR | Status: AC
  Administered 2020-08-20: 10 mg via INTRA_ARTICULAR

## 2020-08-20 NOTE — Progress Notes (Signed)
Subjective:  Patient ID: [name], female    DOB: 05-17-1961,  MRN: [id number]  Chief Complaint  Patient presents with  . Callouses    pt states she has bilateral callus pain.     59 y.o. female presents with the above complaint.  Patient presents with bilateral submetatarsal 5 benign skin lesion/porokeratosis.  She states that she is doing better however she still has pain.  The injection tends to help a lot.  She would like to discuss orthotics management as well.  She denies any other acute complaints..   Review of Systems: Negative except as noted in the HPI. Denies N/V/F/Ch.  Past Medical History:  Diagnosis Date  . Abdominal pain   . Allergy   . GERD (gastroesophageal reflux disease)   . Glaucoma   . Hemorrhoids   . Hiatal hernia   . IBS (irritable bowel syndrome)   . N&V (nausea and vomiting)   . Osteoporosis   . Rectal bleeding   . Rectal ulcer   . Sinus problem   . TMJ (dislocation of temporomandibular joint)   . Vitamin D deficiency     Current Outpatient Medications:  .  alendronate (FOSAMAX) 70 MG tablet, TAKE 1 TABLET BY MOUTH EVERY 7 DAYS. TAKE WITH A FULL GLASS OF WATER ON AN EMPTY STOMACH., Disp: 4 tablet, Rfl: 11 .  cephALEXin (KEFLEX) 500 MG capsule, Take 1 capsule (500 mg total) by mouth 2 (two) times daily for 5 days., Disp: 10 capsule, Rfl: 0 .  cromolyn (OPTICROM) 4 % ophthalmic solution, Place 1 drop into both eyes 4 (four) times daily., Disp: 10 mL, Rfl: 12 .  DENTA 5000 PLUS 1.1 % CREA dental cream, Take by mouth 2 (two) times daily., Disp: , Rfl:  .  dexlansoprazole (DEXILANT) 60 MG capsule, Take 60 mg by mouth daily., Disp: , Rfl:  .  Ergocalciferol (VITAMIN D2) 2000 units TABS, Take 1 tablet by mouth daily., Disp: , Rfl:  .  fexofenadine (ALLEGRA) 180 MG tablet, TAKE 1 TABLET BY MOUTH EVERY DAY, Disp: 30 tablet, Rfl: 11 .  fluconazole (DIFLUCAN) 150 MG tablet, Take 150 mg by mouth once., Disp: , Rfl:  .  fluticasone (FLONASE) 50 MCG/ACT  nasal spray, PLACE 2 SPRAYS INTO EACH NOSTRIL ONCE DAILY, Disp: 16 mL, Rfl: 6 .  gabapentin (NEURONTIN) 300 MG capsule, TAKE 1 CAPSULE BY MOUTH THREE TIMES A DAY, Disp: 90 capsule, Rfl: 0 .  hydrochlorothiazide (HYDRODIURIL) 25 MG tablet, TAKE 1 TABLET BY MOUTH EVERY DAY, Disp: 30 tablet, Rfl: 11 .  losartan (COZAAR) 100 MG tablet, TAKE 1 TABLET BY MOUTH EVERY DAY, Disp: 90 tablet, Rfl: 2 .  Olopatadine HCl (PAZEO) 0.7 % SOLN, INSTILL 1 DROP INTO BOTH EYES EVERY DAY, Disp: 2.5 mL, Rfl: 3 .  PATADAY 0.2 % SOLN, INSTILL 1 DROP INTO AFFECTED EYE EVERY DAY, Disp: 2.5 mL, Rfl: 11 .  sulfamethoxazole-trimethoprim (BACTRIM DS) 800-160 MG tablet, Take 1 tablet by mouth 2 (two) times daily., Disp: 10 tablet, Rfl: 0  Social History   Tobacco Use  Smoking Status Never Smoker  Smokeless Tobacco Never Used    Allergies  Allergen Reactions  . Hydroxyzine Pamoate Rash   Objective:  There were no vitals filed for this visit. There is no height or weight on file to calculate BMI. Constitutional Well developed. Well nourished.  Vascular Dorsalis pedis pulses palpable bilaterally. Posterior tibial pulses palpable bilaterally. Capillary refill normal to all digits.  No cyanosis or clubbing noted. Pedal hair growth  normal.  Neurologic Normal speech. Oriented to person, place, and time. Epicritic sensation to light touch grossly present bilaterally.  Dermatologic  hyperkeratotic lesion noted to bilateral submetatarsal 5 with central nucleated core.  No pinpoint bleeding noted.  Orthopedic: Normal joint ROM without pain or crepitus bilaterally. No visible deformities. No bony tenderness.   Radiographs: None Assessment:   1. Pes planovalgus   2. Capsulitis of metatarsophalangeal (MTP) joint of right foot   3. Capsulitis of metatarsophalangeal (MTP) joint of left foot    Plan:  Patient was evaluated and treated and all questions answered.  Bilateral submetatarsal 5 porokeratosis/benign skin  lesion with underlying capsulitis -I explained patient the etiology of benign skin lesion/porokeratosis and various treatment options were discussed.  Given that patient still having some pain I believe she will benefit from steroid injection to help decrease the acute inflammatory component associated pain.  Patient agrees with plan like to proceed with a steroid injection followed by aggressive debridement of the lesion. -A second steroid injection was performed at bilateral submet 5 using 1% plain Lidocaine and 10 mg of Kenalog. This was well tolerated.   Semiflexible pes planovalgus -I explained patient the etiology of pes planovalgus and various treatment options were discussed.  I believe she will benefit from offloading the submetatarsal 5 bilaterally by controlling the hindfoot motion and support in the arch of the foot.  I discussed with her that she will benefit from orthotics.  She states understanding and would like to get them. -She will be scheduled see rec for custom-made orthotics  No follow-ups on file.

## 2020-08-21 ENCOUNTER — Ambulatory Visit: Payer: Medicaid Other | Admitting: Orthotics

## 2020-08-21 ENCOUNTER — Other Ambulatory Visit: Payer: Self-pay

## 2020-08-21 DIAGNOSIS — Q666 Other congenital valgus deformities of feet: Secondary | ICD-10-CM

## 2020-08-21 DIAGNOSIS — M7751 Other enthesopathy of right foot: Secondary | ICD-10-CM

## 2020-08-21 NOTE — Progress Notes (Signed)
She can't afford f/o so I gave her some diabetic inserts and skived down the lateral border/off loaded 5th and base; she was very pleased with immediate pain reduction.

## 2020-08-31 ENCOUNTER — Other Ambulatory Visit: Payer: Self-pay | Admitting: Family Medicine

## 2020-09-01 ENCOUNTER — Other Ambulatory Visit: Payer: Self-pay | Admitting: Family Medicine

## 2020-09-12 ENCOUNTER — Other Ambulatory Visit: Payer: Self-pay | Admitting: Family Medicine

## 2020-09-18 ENCOUNTER — Other Ambulatory Visit: Payer: Self-pay | Admitting: Family Medicine

## 2020-09-28 ENCOUNTER — Other Ambulatory Visit: Payer: Self-pay

## 2020-09-28 ENCOUNTER — Ambulatory Visit (INDEPENDENT_AMBULATORY_CARE_PROVIDER_SITE_OTHER): Payer: Medicaid Other | Admitting: Podiatry

## 2020-09-28 ENCOUNTER — Encounter: Payer: Self-pay | Admitting: Podiatry

## 2020-09-28 DIAGNOSIS — M7751 Other enthesopathy of right foot: Secondary | ICD-10-CM | POA: Diagnosis not present

## 2020-09-28 DIAGNOSIS — M7752 Other enthesopathy of left foot: Secondary | ICD-10-CM | POA: Diagnosis not present

## 2020-09-28 DIAGNOSIS — Q666 Other congenital valgus deformities of feet: Secondary | ICD-10-CM

## 2020-09-28 NOTE — Progress Notes (Signed)
Subjective:  Patient ID: Heather Gillespie, female    DOB: [birth year],  MRN: [id number]  Chief Complaint  Patient presents with  . Callouses    Bilateral callus. PT stated that she is still having pain where the callus are.    60 y.o. female presents with the above complaint.  Patient presents with bilateral submetatarsal 5 benign skin lesion/porokeratosis.  She states that she is doing better however she still has pain.  The injection tends to help a lot.  She would like to discuss orthotics management as well.  She denies any other acute complaints..   Review of Systems: Negative except as noted in the HPI. Denies N/V/F/Ch.  Past Medical History:  Diagnosis Date  . Abdominal pain   . Allergy   . GERD (gastroesophageal reflux disease)   . Glaucoma   . Hemorrhoids   . Hiatal hernia   . IBS (irritable bowel syndrome)   . N&V (nausea and vomiting)   . Osteoporosis   . Rectal bleeding   . Rectal ulcer   . Sinus problem   . TMJ (dislocation of temporomandibular joint)   . Vitamin D deficiency     Current Outpatient Medications:  .  alendronate (FOSAMAX) 70 MG tablet, TAKE 1 TABLET BY MOUTH EVERY 7 DAYS. TAKE WITH A FULL GLASS OF WATER ON AN EMPTY STOMACH., Disp: 4 tablet, Rfl: 11 .  cromolyn (OPTICROM) 4 % ophthalmic solution, Place 1 drop into both eyes 4 (four) times daily., Disp: 10 mL, Rfl: 12 .  DENTA 5000 PLUS 1.1 % CREA dental cream, Take by mouth 2 (two) times daily., Disp: , Rfl:  .  dexlansoprazole (DEXILANT) 60 MG capsule, Take 60 mg by mouth daily., Disp: , Rfl:  .  Ergocalciferol (VITAMIN D2) 2000 units TABS, Take 1 tablet by mouth daily., Disp: , Rfl:  .  fexofenadine (ALLEGRA) 180 MG tablet, TAKE 1 TABLET BY MOUTH EVERY DAY, Disp: 30 tablet, Rfl: 11 .  fluconazole (DIFLUCAN) 150 MG tablet, Take 150 mg by mouth once., Disp: , Rfl:  .  fluticasone (FLONASE) 50 MCG/ACT nasal spray, PLACE 2 SPRAYS INTO EACH NOSTRIL ONCE DAILY, Disp: 16 mL, Rfl: 6 .  gabapentin  (NEURONTIN) 300 MG capsule, TAKE 1 CAPSULE BY MOUTH THREE TIMES A DAY, Disp: 90 capsule, Rfl: 0 .  hydrochlorothiazide (HYDRODIURIL) 25 MG tablet, TAKE 1 TABLET BY MOUTH EVERY DAY, Disp: 30 tablet, Rfl: 11 .  losartan (COZAAR) 100 MG tablet, TAKE 1 TABLET BY MOUTH EVERY DAY, Disp: 90 tablet, Rfl: 2 .  PATADAY 0.2 % SOLN, INSTILL 1 DROP INTO AFFECTED EYE EVERY DAY, Disp: 2.5 mL, Rfl: 11 .  PATADAY 0.7 % SOLN, INSTILL 1 DROP INTO BOTH EYES EVERY DAY, Disp: 2.5 mL, Rfl: 3 .  sulfamethoxazole-trimethoprim (BACTRIM DS) 800-160 MG tablet, Take 1 tablet by mouth 2 (two) times daily., Disp: 10 tablet, Rfl: 0  Social History   Tobacco Use  Smoking Status Never Smoker  Smokeless Tobacco Never Used    Allergies  Allergen Reactions  . Hydroxyzine Pamoate Rash   Objective:  There were no vitals filed for this visit. There is no height or weight on file to calculate BMI. Constitutional Well developed. Well nourished.  Vascular Dorsalis pedis pulses palpable bilaterally. Posterior tibial pulses palpable bilaterally. Capillary refill normal to all digits.  No cyanosis or clubbing noted. Pedal hair growth normal.  Neurologic Normal speech. Oriented to person, place, and time. Epicritic sensation to light touch grossly present bilaterally.  Dermatologic  hyperkeratotic  lesion noted to bilateral submetatarsal 5 with central nucleated core.  No pinpoint bleeding noted.  Orthopedic: Normal joint ROM without pain or crepitus bilaterally. No visible deformities. No bony tenderness.   Radiographs: None Assessment:   1. Capsulitis of metatarsophalangeal (MTP) joint of right foot   2. Capsulitis of metatarsophalangeal (MTP) joint of left foot   3. Pes planovalgus    Plan:  Patient was evaluated and treated and all questions answered.  Bilateral submetatarsal 5 porokeratosis/benign skin lesion with underlying capsulitis -I explained patient the etiology of benign skin lesion/porokeratosis and  various treatment options were discussed.  Given that patient still having some pain I believe she will benefit from steroid injection to help decrease the acute inflammatory component associated pain.  Patient agrees with plan like to proceed with a steroid injection followed by aggressive debridement of the lesion. -A steroid injection was performed at bilateral submet 5 using 1% plain Lidocaine and 10 mg of Kenalog. This was well tolerated.   Semiflexible pes planovalgus -I explained patient the etiology of pes planovalgus and various treatment options were discussed.  I believe she will benefit from offloading the submetatarsal 5 bilaterally by controlling the hindfoot motion and support in the arch of the foot.  I discussed with her that she will benefit from orthotics.  She states understanding and would like to get them. -She was given orthotics by Liliane Channel.  She seems to be functioning well with them  No follow-ups on file.

## 2020-10-01 ENCOUNTER — Other Ambulatory Visit: Payer: Self-pay | Admitting: Family Medicine

## 2020-10-05 ENCOUNTER — Other Ambulatory Visit: Payer: Self-pay | Admitting: Family Medicine

## 2020-10-20 ENCOUNTER — Other Ambulatory Visit: Payer: Self-pay | Admitting: Family Medicine

## 2020-10-30 ENCOUNTER — Other Ambulatory Visit: Payer: Self-pay | Admitting: Family Medicine

## 2020-11-01 ENCOUNTER — Other Ambulatory Visit: Payer: Self-pay | Admitting: Family Medicine

## 2020-11-15 ENCOUNTER — Other Ambulatory Visit: Payer: Self-pay | Admitting: Family Medicine

## 2020-11-17 ENCOUNTER — Encounter (HOSPITAL_COMMUNITY): Payer: Self-pay

## 2020-11-17 ENCOUNTER — Ambulatory Visit (HOSPITAL_COMMUNITY)
Admission: EM | Admit: 2020-11-17 | Discharge: 2020-11-17 | Disposition: A | Discharge status: Home / Self Care | Payer: Medicaid Other | Attending: Emergency Medicine | Admitting: Emergency Medicine

## 2020-11-17 ENCOUNTER — Other Ambulatory Visit: Payer: Self-pay

## 2020-11-17 DIAGNOSIS — N390 Urinary tract infection, site not specified: Secondary | ICD-10-CM | POA: Diagnosis not present

## 2020-11-17 DIAGNOSIS — L292 Pruritus vulvae: Secondary | ICD-10-CM | POA: Diagnosis not present

## 2020-11-17 LAB — POCT URINALYSIS DIPSTICK, ED / UC
Bilirubin Urine: NEGATIVE
Glucose, UA: NEGATIVE mg/dL
Ketones, ur: NEGATIVE mg/dL
Nitrite: NEGATIVE
Protein, ur: >=300 mg/dL — AB
Specific Gravity, Urine: 1.01 (ref 1.005–1.030)
Urobilinogen, UA: 0.2 mg/dL (ref 0.0–1.0)
pH: >=9 (ref 5.0–8.0)

## 2020-11-17 MED ORDER — PHENAZOPYRIDINE HCL 200 MG PO TABS
200.0000 mg | ORAL_TABLET | Freq: Three times a day (TID) | ORAL | 0 refills | Status: DC | PRN
Start: 2020-11-17 — End: 2022-05-23

## 2020-11-17 MED ORDER — CEFTRIAXONE SODIUM 1 G IJ SOLR
INTRAMUSCULAR | Status: AC
  Filled 2020-11-17: qty 10

## 2020-11-17 MED ORDER — FLUCONAZOLE 150 MG PO TABS: 150.0000 mg | ORAL_TABLET | Freq: Once | ORAL | 1 refills | Status: AC

## 2020-11-17 MED ORDER — LIDOCAINE HCL (PF) 1 % IJ SOLN
INTRAMUSCULAR | Status: AC
  Filled 2020-11-17: qty 30

## 2020-11-17 MED ORDER — CEFTRIAXONE SODIUM 1 G IJ SOLR
1.0000 g | Freq: Once | INTRAMUSCULAR | Status: AC
  Administered 2020-11-17: 1 g via INTRAMUSCULAR

## 2020-11-17 MED ORDER — CEPHALEXIN 500 MG PO CAPS: 500.0000 mg | ORAL_CAPSULE | Freq: Four times a day (QID) | ORAL | 0 refills | Status: AC

## 2020-11-17 NOTE — ED Provider Notes (Signed)
HPI  SUBJECTIVE:  Heather Gillespie is a 60 y.o. female who presents with 1 week of dysuria, hematuria, urinary urgency, frequency, cloudy and odorous urine.  She reports nausea, fevers T-max 100.1.  She reports low midline abdominal and back pain.  Describes a low midline abdominal pain is dull, crampy, intermittent, lasting minutes.  It is worse with urination.  No alleviating factors.  No vomiting, body aches, chills, vaginal odor, discharge, bleeding, genital rash.  She reports vaginal itching and irritation.  States that she is not sexually active.  No antibiotics in the past month.  Antipyretic in the past 6 hours.  She has not tried anything for this.  No alleviating factors.  Symptoms are worse with urinating.  She has a past medical history of UTI, chronic diarrhea, IBS, hypertension, frequent vaginal yeast infections.  No history of chronic kidney disease, diabetes, BV.  PMD: Dr. Rayann Heman  Last 2 urine cultures contaminated.  She had an E. coli UTI in 3/21 sensitive to Macrobid, cephalosporins.  Past Medical History:  Diagnosis Date  . Abdominal pain   . Allergy   . GERD (gastroesophageal reflux disease)   . Glaucoma   . Hemorrhoids   . Hiatal hernia   . IBS (irritable bowel syndrome)   . N&V (nausea and vomiting)   . Osteoporosis   . Rectal bleeding   . Rectal ulcer   . Sinus problem   . TMJ (dislocation of temporomandibular joint)   . Vitamin D deficiency     Past Surgical History:  Procedure Laterality Date  . ANAL FISSURE REPAIR    . BACK SURGERY    . CHOLECYSTECTOMY  1984  . COLONOSCOPY W/ POLYPECTOMY     colon ulcers  . EYE SURGERY    . HEMI-MICRODISCECTOMY LUMBAR LAMINECTOMY LEVEL 1 Left 05/26/2013   Procedure: HEMI-MICRODISCECTOMY LUMBAR LAMINECTOMY L4 - L5 ON THE LEFT LEVEL 1;  Surgeon: Tobi Bastos, MD;  Location: [facility];  Service: Orthopedics;  Laterality: Left;  . HEMORROIDECTOMY      Family History  Problem Relation Age of Onset  . Diabetes  Mother   . Cancer Mother   . Stroke Father   . Breast cancer Neg Hx     Social History   Tobacco Use  . Smoking status: Never Smoker  . Smokeless tobacco: Never Used  Vaping Use  . Vaping Use: Never used  Substance Use Topics  . Alcohol use: No  . Drug use: No    No current facility-administered medications for this encounter.  Current Outpatient Medications:  .  cephALEXin (KEFLEX) 500 MG capsule, Take 1 capsule (500 mg total) by mouth 4 (four) times daily for 10 days., Disp: 40 capsule, Rfl: 0 .  fluconazole (DIFLUCAN) 150 MG tablet, Take 1 tablet (150 mg total) by mouth once for 1 dose. 1 tab po x 1. May repeat in 72 hours if no improvement, Disp: 2 tablet, Rfl: 1 .  phenazopyridine (PYRIDIUM) 200 MG tablet, Take 1 tablet (200 mg total) by mouth 3 (three) times daily as needed for pain., Disp: 6 tablet, Rfl: 0 .  alendronate (FOSAMAX) 70 MG tablet, TAKE 1 TABLET BY MOUTH EVERY 7 DAYS. TAKE WITH A FULL GLASS OF WATER ON AN EMPTY STOMACH., Disp: 4 tablet, Rfl: 11 .  cromolyn (OPTICROM) 4 % ophthalmic solution, Place 1 drop into both eyes 4 (four) times daily., Disp: 10 mL, Rfl: 12 .  DENTA 5000 PLUS 1.1 % CREA dental cream, Take by mouth 2 (  two) times daily., Disp: , Rfl:  .  dexlansoprazole (DEXILANT) 60 MG capsule, Take 60 mg by mouth daily., Disp: , Rfl:  .  Ergocalciferol (VITAMIN D2) 2000 units TABS, Take 1 tablet by mouth daily., Disp: , Rfl:  .  fexofenadine (ALLEGRA) 180 MG tablet, TAKE 1 TABLET BY MOUTH EVERY DAY, Disp: 30 tablet, Rfl: 11 .  fluticasone (FLONASE) 50 MCG/ACT nasal spray, PLACE 2 SPRAYS INTO EACH NOSTRIL ONCE DAILY, Disp: 16 mL, Rfl: 6 .  gabapentin (NEURONTIN) 300 MG capsule, TAKE 1 CAPSULE BY MOUTH THREE TIMES A DAY, Disp: 90 capsule, Rfl: 0 .  hydrochlorothiazide (HYDRODIURIL) 25 MG tablet, TAKE 1 TABLET BY MOUTH EVERY DAY, Disp: 30 tablet, Rfl: 11 .  losartan (COZAAR) 100 MG tablet, TAKE 1 TABLET BY MOUTH EVERY DAY, Disp: 90 tablet, Rfl: 2 .  PATADAY 0.2  % SOLN, INSTILL 1 DROP INTO AFFECTED EYE EVERY DAY, Disp: 2.5 mL, Rfl: 11 .  PATADAY 0.7 % SOLN, INSTILL 1 DROP INTO BOTH EYES EVERY DAY, Disp: 2.5 mL, Rfl: 3  Allergies  Allergen Reactions  . Hydroxyzine Pamoate Rash     ROS  As noted in HPI.   Physical Exam  BP 108/62 (BP Location: Left Arm)   Pulse 81   Temp 98 F (36.7 C) (Oral)   Resp 17   SpO2 97%   Constitutional: Well developed, well nourished, no acute distress Eyes:  EOMI, conjunctiva normal bilaterally HENT: Normocephalic, atraumatic,mucus membranes moist Respiratory: Normal inspiratory effort Cardiovascular: Normal rate GI: nondistended.  Soft, positive suprapubic, bilateral flank tenderness. Back: Positive left CVAT skin: No rash, skin intact Musculoskeletal: no deformities Neurologic: Alert & oriented x 3, no focal neuro deficits Psychiatric: Speech and behavior appropriate   ED Course   Medications  cefTRIAXone (ROCEPHIN) injection 1 g (1 g Intramuscular Given 11/17/20 1127)    Orders Placed This Encounter  Procedures  . Urine culture    Standing Status:   Standing    Number of Occurrences:   1    Order Specific Question:   List patient's active antibiotics    Answer:   Macrobid or keflex  . POC Urinalysis dipstick    Standing Status:   Standing    Number of Occurrences:   1    Results for orders placed or performed during the hospital encounter of 11/17/20 (from the past 24 hour(s))  POC Urinalysis dipstick     Status: Abnormal   Collection Time: 11/17/20 10:29 AM  Result Value Ref Range   Glucose, UA NEGATIVE NEGATIVE mg/dL   Bilirubin Urine NEGATIVE NEGATIVE   Ketones, ur NEGATIVE NEGATIVE mg/dL   Specific Gravity, Urine 1.010 1.005 - 1.030   Hgb urine dipstick LARGE (A) NEGATIVE   pH >=9.0 5.0 - 8.0   Protein, ur >=300 (A) NEGATIVE mg/dL   Urobilinogen, UA 0.2 0.0 - 1.0 mg/dL   Nitrite NEGATIVE NEGATIVE   Leukocytes,Ua TRACE (A) NEGATIVE   No results found.  ED Clinical  Impression  1. Complicated UTI (urinary tract infection)   2. Vulvar itching      ED Assessment/Plan  Previous records, lab reviewed.  As noted in HPI.  1.  UA suggestive of UTI. She has large hematuria, trace leukocytes, proteinuria. Will send for culture to confirm diagnosis and antibiotic choice.  Because she is reporting fevers and has CVAT, will treat as a complicated UTI with 1 g of Rocephin here and Keflex.  Pyridium as well.  She appears nontoxic.  Her vitals are normal  here and has not taken antipyretic in the past 6 hours.  2.  Vulvar itching.  Could be yeast versus BV.  Suspect yeast infection.  We will send off BV, yeast Optima swab, and will send home with Diflucan as she states she frequently gets yeast infections after being treated with antibiotics.  Tylenol/ibuprofen together 3-4 times a day as needed for pain.  Follow-up with PMD as needed.  ER return precautions given peer  Discussed labs, MDM, treatment plan, and plan for follow-up with patient. Discussed sn/sx that should prompt return to the ED. patient agrees with plan.   Meds ordered this encounter  Medications  . cefTRIAXone (ROCEPHIN) injection 1 g  . cephALEXin (KEFLEX) 500 MG capsule    Sig: Take 1 capsule (500 mg total) by mouth 4 (four) times daily for 10 days.    Dispense:  40 capsule    Refill:  0  . phenazopyridine (PYRIDIUM) 200 MG tablet    Sig: Take 1 tablet (200 mg total) by mouth 3 (three) times daily as needed for pain.    Dispense:  6 tablet    Refill:  0  . fluconazole (DIFLUCAN) 150 MG tablet    Sig: Take 1 tablet (150 mg total) by mouth once for 1 dose. 1 tab po x 1. May repeat in 72 hours if no improvement    Dispense:  2 tablet    Refill:  1    *This clinic note was created using [employer]. Therefore, there may be occasional mistakes despite careful proofreading.   ?    Melynda Ripple, MD 11/17/20 1212

## 2020-11-17 NOTE — ED Triage Notes (Signed)
Pt presents with recurring burning during urination and blood in urine X 1 week.

## 2020-11-17 NOTE — Discharge Instructions (Addendum)
Finish the Keflex, even if you feel better.  Pyridium will help with your urinary symptoms.  You can do 200 mg of ibuprofen with 500 mg of Tylenol 3-4 times a day as needed for pain.  I am giving you Diflucan in case you have a accompanying yeast infection.  We will contact you if we need to prescribe additional medications.

## 2020-11-19 LAB — URINE CULTURE: Culture: >=100000 — AB

## 2020-11-19 LAB — CERVICOVAGINAL ANCILLARY ONLY
Bacterial Vaginitis (gardnerella): NEGATIVE
Candida Glabrata: NEGATIVE
Candida Vaginitis: NEGATIVE
Comment: NEGATIVE
Comment: NEGATIVE
Comment: NEGATIVE

## 2020-11-20 ENCOUNTER — Other Ambulatory Visit: Payer: Self-pay | Admitting: Family Medicine

## 2020-11-25 ENCOUNTER — Other Ambulatory Visit: Payer: Self-pay | Admitting: Family Medicine

## 2020-11-27 ENCOUNTER — Other Ambulatory Visit: Payer: Self-pay | Admitting: Family Medicine

## 2020-11-28 ENCOUNTER — Other Ambulatory Visit: Payer: Self-pay

## 2020-11-28 ENCOUNTER — Ambulatory Visit (INDEPENDENT_AMBULATORY_CARE_PROVIDER_SITE_OTHER): Payer: Medicaid Other | Admitting: Podiatry

## 2020-11-28 DIAGNOSIS — M7751 Other enthesopathy of right foot: Secondary | ICD-10-CM

## 2020-11-28 DIAGNOSIS — Z01818 Encounter for other preprocedural examination: Secondary | ICD-10-CM

## 2020-11-28 DIAGNOSIS — M216X2 Other acquired deformities of left foot: Secondary | ICD-10-CM

## 2020-11-29 ENCOUNTER — Encounter: Payer: Self-pay | Admitting: Podiatry

## 2020-11-29 NOTE — Progress Notes (Signed)
Subjective:  Patient ID: Heather Gillespie, female    DOB: April 26, 1961,  MRN: [id number]  Chief Complaint  Patient presents with  . Nail Problem    Nail and callus care    60 y.o. female presents with the above complaint.  Patient presents with bilateral submetatarsal 5 benign skin lesion/porokeratosis.  She states it is very painful.  Injections orthotics padding none of that is helping anymore.  At this time she is ready to undergo surgical intervention.  She denies any other acute complaints.  She has failed all conservative treatment options.     Review of Systems: Negative except as noted in the HPI. Denies N/V/F/Ch.  Past Medical History:  Diagnosis Date  . Abdominal pain   . Allergy   . GERD (gastroesophageal reflux disease)   . Glaucoma   . Hemorrhoids   . Hiatal hernia   . IBS (irritable bowel syndrome)   . N&V (nausea and vomiting)   . Osteoporosis   . Rectal bleeding   . Rectal ulcer   . Sinus problem   . TMJ (dislocation of temporomandibular joint)   . Vitamin D deficiency     Current Outpatient Medications:  .  alendronate (FOSAMAX) 70 MG tablet, TAKE 1 TABLET BY MOUTH EVERY 7 DAYS. TAKE WITH A FULL GLASS OF WATER ON AN EMPTY STOMACH., Disp: 4 tablet, Rfl: 11 .  cromolyn (OPTICROM) 4 % ophthalmic solution, Place 1 drop into both eyes 4 (four) times daily., Disp: 10 mL, Rfl: 12 .  DENTA 5000 PLUS 1.1 % CREA dental cream, Take by mouth 2 (two) times daily., Disp: , Rfl:  .  dexlansoprazole (DEXILANT) 60 MG capsule, Take 60 mg by mouth daily., Disp: , Rfl:  .  Ergocalciferol (VITAMIN D2) 2000 units TABS, Take 1 tablet by mouth daily., Disp: , Rfl:  .  fexofenadine (ALLEGRA) 180 MG tablet, TAKE 1 TABLET BY MOUTH EVERY DAY, Disp: 30 tablet, Rfl: 11 .  fluticasone (FLONASE) 50 MCG/ACT nasal spray, PLACE 2 SPRAYS INTO EACH NOSTRIL ONCE DAILY, Disp: 16 mL, Rfl: 6 .  gabapentin (NEURONTIN) 300 MG capsule, TAKE 1 CAPSULE BY MOUTH THREE TIMES A DAY, Disp: 90 capsule, Rfl:  0 .  hydrochlorothiazide (HYDRODIURIL) 25 MG tablet, TAKE 1 TABLET BY MOUTH EVERY DAY, Disp: 30 tablet, Rfl: 11 .  losartan (COZAAR) 100 MG tablet, TAKE 1 TABLET BY MOUTH EVERY DAY, Disp: 90 tablet, Rfl: 2 .  PATADAY 0.2 % SOLN, INSTILL 1 DROP INTO AFFECTED EYE EVERY DAY, Disp: 2.5 mL, Rfl: 11 .  PATADAY 0.7 % SOLN, INSTILL 1 DROP INTO BOTH EYES EVERY DAY, Disp: 2.5 mL, Rfl: 3 .  phenazopyridine (PYRIDIUM) 200 MG tablet, Take 1 tablet (200 mg total) by mouth 3 (three) times daily as needed for pain., Disp: 6 tablet, Rfl: 0  Social History   Tobacco Use  Smoking Status Never Smoker  Smokeless Tobacco Never Used    Allergies  Allergen Reactions  . Hydroxyzine Pamoate Rash   Objective:  There were no vitals filed for this visit. There is no height or weight on file to calculate BMI. Constitutional Well developed. Well nourished.  Vascular Dorsalis pedis pulses palpable bilaterally. Posterior tibial pulses palpable bilaterally. Capillary refill normal to all digits.  No cyanosis or clubbing noted. Pedal hair growth normal.  Neurologic Normal speech. Oriented to person, place, and time. Epicritic sensation to light touch grossly present bilaterally.  Dermatologic  hyperkeratotic lesion noted to bilateral submetatarsal 5 with central nucleated core.  No pinpoint  bleeding noted.  Pain on palpation with range of motion of the fifth metatarsophalangeal joint slightly.  No intra-articular pain noted.  Plantarflexed fifth metatarsal noted leading to excessive pressure to the submetatarsal 5 forming a lesion.  Orthopedic: Normal joint ROM without pain or crepitus bilaterally. No visible deformities. No bony tenderness.   Radiographs: None Assessment:   1. 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 Preoperative examination   3. Capsulitis of metatarsophalangeal (MTP) joint of right foot    Plan:  Patient was evaluated and treated and all questions answered.  Left submetatarsal  5 porokeratosis/benign skin lesion with underlying plantarflexed metatarsal -I explained patient the etiology of benign skin lesion/porokeratosis and various treatment options were discussed.  Given that patient still having some pain I believe she will benefit from steroid injection to help decrease the acute inflammatory component associated pain.  Patient agrees with plan like to proceed with a steroid injection followed by aggressive debridement of the lesion. -A steroid injection was performed at left submet 5 using 1% plain Lidocaine and 10 mg of Kenalog. This was well tolerated. -I discussed with the patient in extensive detail that given that she is now having antalgic gait be secondary to pain I believe patient will benefit from surgical intervention with floating osteotomy of the left to allow the body to decide where for the capital fragment to sit and allow it to heal.  I discussed my surgical planning with extensively with the patient.  She states understanding and would like to proceed with the surgery.  She has failed all conservative treatment options including padding debridement injection orthotics and we related wishes to have surgical intervention at this time. -She can be weightbearing as tolerated with a surgical shoe. -Informed surgical risk consent was reviewed and read aloud to the patient.  I reviewed the films.  I have discussed my findings with the patient in great detail.  I have discussed all risks including but not limited to infection, stiffness, scarring, limp, disability, deformity, damage to blood vessels and nerves, numbness, poor healing, need for braces, arthritis, chronic pain, amputation, death.  All benefits and realistic expectations discussed in great detail.  I have made no promises as to the outcome.  I have provided realistic expectations.  I have offered the patient a 2nd opinion, which they have declined and assured me they preferred to proceed despite the  risks -A total of 33 minutes was spent in direct patient care as well as pre and post patient encounter activities.  This includes documentation as well as reviewing patient chart for labs, imaging, past medical, surgical, social, and family history as documented in the EMR.  I have reviewed medication allergies as documented in EMR.  I discussed the etiology of condition and treatment options from conservative to surgical care.  All risks and benefit of the treatment course was discussed in detail.  All questions were answered and return appointment was discussed.  Since the visit completed in an ambulatory/outpatient setting, the patient and/or parent/guardian has been advised to contact the providers office for worsening condition and seek medical treatment and/or call 911 if the patient deems either is necessary.   Semiflexible pes planovalgus -I explained patient the etiology of pes planovalgus and various treatment options were discussed.  I believe she will benefit from offloading the submetatarsal 5 bilaterally by controlling the hindfoot motion and support in the arch of the foot.  I discussed with her that she will benefit from orthotics.  She states understanding and would like to get them. -She was given orthotics by Liliane Channel.  She seems to be functioning well with them  No follow-ups on file.

## 2020-12-13 ENCOUNTER — Telehealth: Payer: Self-pay

## 2020-12-13 NOTE — Telephone Encounter (Signed)
DOS 12/24/2020  METATARSAL OSTEOTOMY 5TH LT - [place] EFFECTIVE 03/15/2020  AUTH # [id number] FOR CPT 28308 GOOD FROM 12/24/2020 - 02/21/2021

## 2020-12-20 ENCOUNTER — Other Ambulatory Visit: Payer: Self-pay | Admitting: *Deleted

## 2020-12-20 MED ORDER — HYDROCHLOROTHIAZIDE 25 MG PO TABS: 1.0000 | ORAL_TABLET | Freq: Every day | ORAL | 3 refills | Status: DC

## 2020-12-20 MED ORDER — GABAPENTIN 300 MG PO CAPS: ORAL_CAPSULE | ORAL | 0 refills | Status: DC

## 2020-12-24 ENCOUNTER — Encounter: Payer: Self-pay | Admitting: Podiatry

## 2020-12-24 ENCOUNTER — Other Ambulatory Visit: Payer: Self-pay | Admitting: Podiatry

## 2020-12-24 DIAGNOSIS — M21542 Acquired clubfoot, left foot: Secondary | ICD-10-CM | POA: Diagnosis not present

## 2020-12-24 DIAGNOSIS — M216X2 Other acquired deformities of left foot: Secondary | ICD-10-CM | POA: Diagnosis not present

## 2020-12-24 MED ORDER — OXYCODONE-ACETAMINOPHEN 5-325 MG PO TABS: 1.0000 | ORAL_TABLET | ORAL | 0 refills | Status: DC | PRN

## 2020-12-24 MED ORDER — IBUPROFEN 800 MG PO TABS: 800.0000 mg | ORAL_TABLET | Freq: Four times a day (QID) | ORAL | 1 refills | Status: DC | PRN

## 2021-01-02 ENCOUNTER — Other Ambulatory Visit: Payer: Self-pay | Admitting: Podiatry

## 2021-01-02 ENCOUNTER — Ambulatory Visit (INDEPENDENT_AMBULATORY_CARE_PROVIDER_SITE_OTHER): Payer: Medicaid Other

## 2021-01-02 ENCOUNTER — Encounter: Payer: Self-pay | Admitting: Podiatry

## 2021-01-02 ENCOUNTER — Other Ambulatory Visit: Payer: Self-pay

## 2021-01-02 ENCOUNTER — Ambulatory Visit (INDEPENDENT_AMBULATORY_CARE_PROVIDER_SITE_OTHER): Payer: Medicaid Other | Admitting: Podiatry

## 2021-01-02 ENCOUNTER — Ambulatory Visit: Payer: Medicaid Other | Admitting: Podiatry

## 2021-01-02 DIAGNOSIS — Z9889 Other specified postprocedural states: Secondary | ICD-10-CM

## 2021-01-02 DIAGNOSIS — M216X2 Other acquired deformities of left foot: Secondary | ICD-10-CM

## 2021-01-02 NOTE — Progress Notes (Signed)
Subjective:  Patient ID: Heather Gillespie, female    DOB: Dec 30, 1960,  MRN: [id number]  Chief Complaint  Patient presents with  . Routine Post Op    POV#1 -w/ nausea but denies V/F?Ch -dressing intact -pt states with a lot of pain; 10/10      DOS: 12/24/2020 Procedure: Left fifth metatarsal floating osteotomy  60 y.o. female returns for post-op check.  Patient is doing well.  She states that she did have some pain well controlled with pain medication.  She does not need anymore.  She is ambulating with surgical shoe.  She denies any other acute complaints.  Review of Systems: Negative except as noted in the HPI. Denies N/V/F/Ch.  Past Medical History:  Diagnosis Date  . Abdominal pain   . Allergy   . GERD (gastroesophageal reflux disease)   . Glaucoma   . Hemorrhoids   . Hiatal hernia   . IBS (irritable bowel syndrome)   . N&V (nausea and vomiting)   . Osteoporosis   . Rectal bleeding   . Rectal ulcer   . Sinus problem   . TMJ (dislocation of temporomandibular joint)   . Vitamin D deficiency     Current Outpatient Medications:  .  alendronate (FOSAMAX) 70 MG tablet, TAKE 1 TABLET BY MOUTH EVERY 7 DAYS. TAKE WITH A FULL GLASS OF WATER ON AN EMPTY STOMACH., Disp: 4 tablet, Rfl: 11 .  cromolyn (OPTICROM) 4 % ophthalmic solution, Place 1 drop into both eyes 4 (four) times daily., Disp: 10 mL, Rfl: 12 .  DENTA 5000 PLUS 1.1 % CREA dental cream, Take by mouth 2 (two) times daily., Disp: , Rfl:  .  dexlansoprazole (DEXILANT) 60 MG capsule, Take 60 mg by mouth daily., Disp: , Rfl:  .  Ergocalciferol (VITAMIN D2) 2000 units TABS, Take 1 tablet by mouth daily., Disp: , Rfl:  .  fexofenadine (ALLEGRA) 180 MG tablet, TAKE 1 TABLET BY MOUTH EVERY DAY, Disp: 30 tablet, Rfl: 11 .  fluticasone (FLONASE) 50 MCG/ACT nasal spray, PLACE 2 SPRAYS INTO EACH NOSTRIL ONCE DAILY, Disp: 16 mL, Rfl: 6 .  gabapentin (NEURONTIN) 300 MG capsule, TAKE 1 CAPSULE BY MOUTH THREE TIMES A DAY, Disp: 90  capsule, Rfl: 0 .  hydrochlorothiazide (HYDRODIURIL) 25 MG tablet, Take 1 tablet (25 mg total) by mouth daily., Disp: 90 tablet, Rfl: 3 .  ibuprofen (ADVIL) 800 MG tablet, Take 1 tablet (800 mg total) by mouth every 6 (six) hours as needed., Disp: 60 tablet, Rfl: 1 .  losartan (COZAAR) 100 MG tablet, TAKE 1 TABLET BY MOUTH EVERY DAY, Disp: 90 tablet, Rfl: 2 .  oxyCODONE-acetaminophen (PERCOCET) 5-325 MG tablet, Take 1-2 tablets by mouth every 4 (four) hours as needed for severe pain., Disp: 30 tablet, Rfl: 0 .  PATADAY 0.2 % SOLN, INSTILL 1 DROP INTO AFFECTED EYE EVERY DAY, Disp: 2.5 mL, Rfl: 11 .  PATADAY 0.7 % SOLN, INSTILL 1 DROP INTO BOTH EYES EVERY DAY, Disp: 2.5 mL, Rfl: 3 .  phenazopyridine (PYRIDIUM) 200 MG tablet, Take 1 tablet (200 mg total) by mouth 3 (three) times daily as needed for pain., Disp: 6 tablet, Rfl: 0  Social History   Tobacco Use  Smoking Status Never Smoker  Smokeless Tobacco Never Used    Allergies  Allergen Reactions  . Other Other (See Comments)  . Hydroxyzine Pamoate Rash   Objective:  There were no vitals filed for this visit. There is no height or weight on file to calculate BMI. Constitutional Well  developed. Well nourished.  Vascular Foot warm and well perfused. Capillary refill normal to all digits.   Neurologic Normal speech. Oriented to person, place, and time. Epicritic sensation to light touch grossly present bilaterally.  Dermatologic Skin healing well without signs of infection. Skin edges well coapted without signs of infection.  Orthopedic: Tenderness to palpation noted about the surgical site.   Radiographs: 3 views of skeletally mature adult left foot: Osteotomy noted at the fifth metatarsal.  Good correction alignment noted. Assessment:   1. 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 Status post foot surgery    Plan:  Patient was evaluated and treated and all questions answered.  S/p foot surgery left -Progressing as  expected post-operatively. -XR: See above -WB Status: Weightbearing as tolerated in surgical shoe -Sutures: Intact.  No clinical signs of dehiscence noted.  No complication noted. -Medications: None -Foot redressed.  No follow-ups on file.

## 2021-01-11 ENCOUNTER — Other Ambulatory Visit: Payer: Self-pay | Admitting: *Deleted

## 2021-01-11 MED ORDER — GABAPENTIN 300 MG PO CAPS: ORAL_CAPSULE | ORAL | 3 refills | Status: DC

## 2021-01-16 ENCOUNTER — Ambulatory Visit (INDEPENDENT_AMBULATORY_CARE_PROVIDER_SITE_OTHER): Payer: Medicaid Other | Admitting: Podiatry

## 2021-01-16 ENCOUNTER — Other Ambulatory Visit: Payer: Self-pay

## 2021-01-16 DIAGNOSIS — M216X2 Other acquired deformities of left foot: Secondary | ICD-10-CM | POA: Diagnosis not present

## 2021-01-16 DIAGNOSIS — Z9889 Other specified postprocedural states: Secondary | ICD-10-CM

## 2021-01-16 DIAGNOSIS — M7751 Other enthesopathy of right foot: Secondary | ICD-10-CM | POA: Diagnosis not present

## 2021-01-18 ENCOUNTER — Encounter: Payer: Self-pay | Admitting: Podiatry

## 2021-01-18 NOTE — Progress Notes (Signed)
Subjective:  Patient ID: Heather Gillespie, female    DOB: 01-31-1961,  MRN: [id number]  Chief Complaint  Patient presents with  . Routine Post Op    POST OP DOS 4.11.22    DOS: 12/24/2020 Procedure: Left fifth metatarsal floating osteotomy  60 y.o. female returns for post-op check.  Patient is doing well.  She states that she did have some pain well controlled with pain medication.  She does not need anymore.  She is ambulating with surgical shoe.  She denies any other acute complaints.  Review of Systems: Negative except as noted in the HPI. Denies N/V/F/Ch.  Past Medical History:  Diagnosis Date  . Abdominal pain   . Allergy   . GERD (gastroesophageal reflux disease)   . Glaucoma   . Hemorrhoids   . Hiatal hernia   . IBS (irritable bowel syndrome)   . N&V (nausea and vomiting)   . Osteoporosis   . Rectal bleeding   . Rectal ulcer   . Sinus problem   . TMJ (dislocation of temporomandibular joint)   . Vitamin D deficiency     Current Outpatient Medications:  .  alendronate (FOSAMAX) 70 MG tablet, TAKE 1 TABLET BY MOUTH EVERY 7 DAYS. TAKE WITH A FULL GLASS OF WATER ON AN EMPTY STOMACH., Disp: 4 tablet, Rfl: 11 .  cromolyn (OPTICROM) 4 % ophthalmic solution, Place 1 drop into both eyes 4 (four) times daily., Disp: 10 mL, Rfl: 12 .  DENTA 5000 PLUS 1.1 % CREA dental cream, Take by mouth 2 (two) times daily., Disp: , Rfl:  .  dexlansoprazole (DEXILANT) 60 MG capsule, Take 60 mg by mouth daily., Disp: , Rfl:  .  Ergocalciferol (VITAMIN D2) 2000 units TABS, Take 1 tablet by mouth daily., Disp: , Rfl:  .  fexofenadine (ALLEGRA) 180 MG tablet, TAKE 1 TABLET BY MOUTH EVERY DAY, Disp: 30 tablet, Rfl: 11 .  fluticasone (FLONASE) 50 MCG/ACT nasal spray, PLACE 2 SPRAYS INTO EACH NOSTRIL ONCE DAILY, Disp: 16 mL, Rfl: 6 .  gabapentin (NEURONTIN) 300 MG capsule, TAKE 1 CAPSULE BY MOUTH THREE TIMES A DAY, Disp: 90 capsule, Rfl: 3 .  hydrochlorothiazide (HYDRODIURIL) 25 MG tablet, Take 1  tablet (25 mg total) by mouth daily., Disp: 90 tablet, Rfl: 3 .  ibuprofen (ADVIL) 800 MG tablet, Take 1 tablet (800 mg total) by mouth every 6 (six) hours as needed., Disp: 60 tablet, Rfl: 1 .  losartan (COZAAR) 100 MG tablet, TAKE 1 TABLET BY MOUTH EVERY DAY, Disp: 90 tablet, Rfl: 2 .  oxyCODONE-acetaminophen (PERCOCET) 5-325 MG tablet, Take 1-2 tablets by mouth every 4 (four) hours as needed for severe pain., Disp: 30 tablet, Rfl: 0 .  PATADAY 0.2 % SOLN, INSTILL 1 DROP INTO AFFECTED EYE EVERY DAY, Disp: 2.5 mL, Rfl: 11 .  PATADAY 0.7 % SOLN, INSTILL 1 DROP INTO BOTH EYES EVERY DAY, Disp: 2.5 mL, Rfl: 3 .  phenazopyridine (PYRIDIUM) 200 MG tablet, Take 1 tablet (200 mg total) by mouth 3 (three) times daily as needed for pain., Disp: 6 tablet, Rfl: 0  Social History   Tobacco Use  Smoking Status Never Smoker  Smokeless Tobacco Never Used    Allergies  Allergen Reactions  . Other Other (See Comments)  . Hydroxyzine Pamoate Rash   Objective:  There were no vitals filed for this visit. There is no height or weight on file to calculate BMI. Constitutional Well developed. Well nourished.  Vascular Foot warm and well perfused. Capillary refill normal to  all digits.   Neurologic Normal speech. Oriented to person, place, and time. Epicritic sensation to light touch grossly present bilaterally.  Dermatologic  skin looks completely epithelialized.  No clinical signs of infection noted.  Good correction alignment noted.  There is a decrease in hyperkeratotic lesion submetatarsal 5 on the left side.  Right submetatarsal 5's porokeratotic lesion with central nucleated core noted pain on palpation.  Pain with range of motion of the fifth digit.  No deep intra-articular pain noted with range of motion.  Orthopedic: Tenderness to palpation noted about the surgical site.   Radiographs: 3 views of skeletally mature adult left foot: Osteotomy noted at the fifth metatarsal.  Good correction  alignment noted. Assessment:   1. 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 Status post foot surgery   3. Capsulitis of metatarsophalangeal (MTP) joint of right foot    Plan:  Patient was evaluated and treated and all questions answered.  S/p foot surgery left -Progressing as expected post-operatively. -XR: See above -WB Status: Weightbearing as tolerated in surgical shoe -Sutures: Removed no clinical signs of dehiscence noted.  No complication noted. -Medications: None -Foot redressed.  Right porokeratosis with underlying capsulitis submetatarsal 5 -I explained to patient the etiology of porokeratosis versus treatment options were discussed.  Given the amount of pain she is having I believe patient will benefit from a steroid injection to help decrease acute inflammatory component associated pain.  Patient agrees with plan like to proceed with a steroid injection. -A steroid injection was performed at right fifth metatarsophalangeal joint using 1% plain Lidocaine and 10 mg of Kenalog. This was well tolerated.   No follow-ups on file.

## 2021-01-19 ENCOUNTER — Other Ambulatory Visit: Payer: Self-pay | Admitting: Podiatry

## 2021-01-23 ENCOUNTER — Other Ambulatory Visit: Payer: Self-pay | Admitting: Family Medicine

## 2021-01-23 DIAGNOSIS — M810 Age-related osteoporosis without current pathological fracture: Secondary | ICD-10-CM

## 2021-01-29 ENCOUNTER — Ambulatory Visit (INDEPENDENT_AMBULATORY_CARE_PROVIDER_SITE_OTHER): Payer: Medicaid Other | Admitting: Podiatry

## 2021-01-29 ENCOUNTER — Other Ambulatory Visit: Payer: Self-pay

## 2021-01-29 DIAGNOSIS — Z01818 Encounter for other preprocedural examination: Secondary | ICD-10-CM

## 2021-01-29 DIAGNOSIS — M216X1 Other acquired deformities of right foot: Secondary | ICD-10-CM | POA: Diagnosis not present

## 2021-01-30 ENCOUNTER — Encounter: Payer: Self-pay | Admitting: Podiatry

## 2021-01-30 NOTE — Progress Notes (Signed)
Subjective:  Patient ID: Heather Gillespie, female    DOB: 04-07-1961,  MRN: [id number]  Chief Complaint  Patient presents with  . Callouses    Painful hard place on the side of right foot     60 y.o. female presents with the above complaint.  Patient presents now with the right side plantarflexed metatarsal with excessive pain.  Patient states is painful to touch.  She would like to undergo surgery to address the right side as well.  The left side is completely healed and has not been causing her any pain.  She is very happy with the left side   Review of Systems: Negative except as noted in the HPI. Denies N/V/F/Ch.  Past Medical History:  Diagnosis Date  . Abdominal pain   . Allergy   . GERD (gastroesophageal reflux disease)   . Glaucoma   . Hemorrhoids   . Hiatal hernia   . IBS (irritable bowel syndrome)   . N&V (nausea and vomiting)   . Osteoporosis   . Rectal bleeding   . Rectal ulcer   . Sinus problem   . TMJ (dislocation of temporomandibular joint)   . Vitamin D deficiency     Current Outpatient Medications:  .  alendronate (FOSAMAX) 70 MG tablet, TAKE 1 TABLET BY MOUTH EVERY 7 DAYS. TAKE WITH A FULL GLASS OF WATER ON AN EMPTY STOMACH., Disp: 4 tablet, Rfl: 11 .  cromolyn (OPTICROM) 4 % ophthalmic solution, Place 1 drop into both eyes 4 (four) times daily., Disp: 10 mL, Rfl: 12 .  DENTA 5000 PLUS 1.1 % CREA dental cream, Take by mouth 2 (two) times daily., Disp: , Rfl:  .  dexlansoprazole (DEXILANT) 60 MG capsule, Take 60 mg by mouth daily., Disp: , Rfl:  .  Ergocalciferol (VITAMIN D2) 2000 units TABS, Take 1 tablet by mouth daily., Disp: , Rfl:  .  fexofenadine (ALLEGRA) 180 MG tablet, TAKE 1 TABLET BY MOUTH EVERY DAY, Disp: 30 tablet, Rfl: 11 .  fluticasone (FLONASE) 50 MCG/ACT nasal spray, PLACE 2 SPRAYS INTO EACH NOSTRIL ONCE DAILY, Disp: 16 mL, Rfl: 6 .  gabapentin (NEURONTIN) 300 MG capsule, TAKE 1 CAPSULE BY MOUTH THREE TIMES A DAY, Disp: 90 capsule, Rfl: 3 .   hydrochlorothiazide (HYDRODIURIL) 25 MG tablet, Take 1 tablet (25 mg total) by mouth daily., Disp: 90 tablet, Rfl: 3 .  ibuprofen (ADVIL) 800 MG tablet, TAKE 1 TABLET BY MOUTH EVERY 6 HOURS AS NEEDED., Disp: 60 tablet, Rfl: 1 .  losartan (COZAAR) 100 MG tablet, TAKE 1 TABLET BY MOUTH EVERY DAY, Disp: 90 tablet, Rfl: 2 .  oxyCODONE-acetaminophen (PERCOCET) 5-325 MG tablet, Take 1-2 tablets by mouth every 4 (four) hours as needed for severe pain., Disp: 30 tablet, Rfl: 0 .  PATADAY 0.2 % SOLN, INSTILL 1 DROP INTO AFFECTED EYE EVERY DAY, Disp: 2.5 mL, Rfl: 11 .  PATADAY 0.7 % SOLN, INSTILL 1 DROP INTO BOTH EYES EVERY DAY, Disp: 2.5 mL, Rfl: 3 .  phenazopyridine (PYRIDIUM) 200 MG tablet, Take 1 tablet (200 mg total) by mouth 3 (three) times daily as needed for pain., Disp: 6 tablet, Rfl: 0  Social History   Tobacco Use  Smoking Status Never Smoker  Smokeless Tobacco Never Used    Allergies  Allergen Reactions  . Other Other (See Comments)  . Hydroxyzine Pamoate Rash   Objective:  There were no vitals filed for this visit. There is no height or weight on file to calculate BMI. Constitutional Well developed. Well  nourished.  Vascular Dorsalis pedis pulses palpable bilaterally. Posterior tibial pulses palpable bilaterally. Capillary refill normal to all digits.  No cyanosis or clubbing noted. Pedal hair growth normal.  Neurologic Normal speech. Oriented to person, place, and time. Epicritic sensation to light touch grossly present bilaterally.  Dermatologic  hyperkeratotic lesion noted to right submetatarsal 5 with central nucleated core.  No pinpoint bleeding noted.  Pain on palpation with range of motion of the fifth metatarsophalangeal joint slightly.  No intra-articular pain noted.  Plantarflexed fifth metatarsal noted leading to excessive pressure to the submetatarsal 5 forming a lesion.  Left side has completely healed and clinically healed.  Orthopedic: Normal joint ROM without  pain or crepitus bilaterally. No visible deformities. No bony tenderness.   Radiographs: None Assessment:   1. Plantar flexed metatarsal, right   2. Preoperative examination    Plan:  Patient was evaluated and treated and all questions answered.  Left plantarflexed metatarsal status post foot surgery -Clinically healed.  No further pain patient is officially discharged from my care on the left side  Right submetatarsal 5 porokeratosis/benign skin lesion with underlying plantarflexed metatarsal -I explained patient the etiology of benign skin lesion/porokeratosis and various treatment options were discussed.  Given that patient still having some pain I believe she will benefit from steroid injection to help decrease the acute inflammatory component associated pain.  Patient agrees with plan like to proceed with a steroid injection followed by aggressive debridement of the lesion. -A steroid injection was performed at left submet 5 using 1% plain Lidocaine and 10 mg of Kenalog. This was well tolerated. -I discussed with the patient in extensive detail that given that she is now having antalgic gait be secondary to pain I believe patient will benefit from surgical intervention with floating osteotomy of the left to allow the body to decide where for the capital fragment to sit and allow it to heal.  I discussed my surgical planning with extensively with the patient.  She states understanding and would like to proceed with the surgery.  She has failed all conservative treatment options including padding debridement injection orthotics and we related wishes to have surgical intervention at this time. -She can be weightbearing as tolerated with a surgical shoe. -Informed surgical risk consent was reviewed and read aloud to the patient.  I reviewed the films.  I have discussed my findings with the patient in great detail.  I have discussed all risks including but not limited to infection, stiffness,  scarring, limp, disability, deformity, damage to blood vessels and nerves, numbness, poor healing, need for braces, arthritis, chronic pain, amputation, death.  All benefits and realistic expectations discussed in great detail.  I have made no promises as to the outcome.  I have provided realistic expectations.  I have offered the patient a 2nd opinion, which they have declined and assured me they preferred to proceed despite the risks -A total of 33 minutes was spent in direct patient care as well as pre and post patient encounter activities.  This includes documentation as well as reviewing patient chart for labs, imaging, past medical, surgical, social, and family history as documented in the EMR.  I have reviewed medication allergies as documented in EMR.  I discussed the etiology of condition and treatment options from conservative to surgical care.  All risks and benefit of the treatment course was discussed in detail.  All questions were answered and return appointment was discussed.  Since the visit completed in an ambulatory/outpatient setting,  the patient and/or parent/guardian has been advised to contact the providers office for worsening condition and seek medical treatment and/or call 911 if the patient deems either is necessary.   Semiflexible pes planovalgus -I explained patient the etiology of pes planovalgus and various treatment options were discussed.  I believe she will benefit from offloading the submetatarsal 5 bilaterally by controlling the hindfoot motion and support in the arch of the foot.  I discussed with her that she will benefit from orthotics.  She states understanding and would like to get them. -She was given orthotics by Liliane Channel.  She seems to be functioning well with them  No follow-ups on file.

## 2021-02-10 ENCOUNTER — Other Ambulatory Visit: Payer: Self-pay | Admitting: Podiatry

## 2021-02-13 NOTE — Telephone Encounter (Signed)
Please advise 

## 2021-02-22 ENCOUNTER — Telehealth: Payer: Self-pay | Admitting: Urology

## 2021-02-22 NOTE — Telephone Encounter (Signed)
DOS - 02/25/21   METATARSAL OSTEO 5TH RIGHT ---- 28308   HEALTHY BLUE EFFECTIVE DATE - 03/15/20   PER [employer]ALTH[employer]UES AUTOMATIVE SYSTEM ( AVALITIY ISNT WORKING) CPT CODE 34035 HAS BEEN APPROVED, AUTH # [id number], GOOD FROM 02/25/21 - 04/25/21.

## 2021-02-25 ENCOUNTER — Encounter: Payer: Self-pay | Admitting: Podiatry

## 2021-02-25 ENCOUNTER — Other Ambulatory Visit: Payer: Self-pay | Admitting: Podiatry

## 2021-02-25 DIAGNOSIS — M21541 Acquired clubfoot, right foot: Secondary | ICD-10-CM | POA: Diagnosis not present

## 2021-02-25 MED ORDER — IBUPROFEN 800 MG PO TABS: 800.0000 mg | ORAL_TABLET | Freq: Four times a day (QID) | ORAL | 1 refills | Status: DC | PRN

## 2021-02-25 MED ORDER — OXYCODONE-ACETAMINOPHEN 5-325 MG PO TABS: 1.0000 | ORAL_TABLET | ORAL | 0 refills | Status: DC | PRN

## 2021-02-27 ENCOUNTER — Encounter: Payer: Medicaid Other | Admitting: Podiatry

## 2021-03-06 ENCOUNTER — Ambulatory Visit (INDEPENDENT_AMBULATORY_CARE_PROVIDER_SITE_OTHER): Payer: Medicaid Other | Admitting: Podiatry

## 2021-03-06 ENCOUNTER — Ambulatory Visit (INDEPENDENT_AMBULATORY_CARE_PROVIDER_SITE_OTHER): Payer: Medicaid Other

## 2021-03-06 ENCOUNTER — Other Ambulatory Visit: Payer: Self-pay

## 2021-03-06 DIAGNOSIS — M216X1 Other acquired deformities of right foot: Secondary | ICD-10-CM

## 2021-03-06 DIAGNOSIS — Z9889 Other specified postprocedural states: Secondary | ICD-10-CM

## 2021-03-09 ENCOUNTER — Other Ambulatory Visit: Payer: Self-pay | Admitting: Family Medicine

## 2021-03-12 ENCOUNTER — Encounter: Payer: Self-pay | Admitting: Podiatry

## 2021-03-12 NOTE — Progress Notes (Signed)
Subjective:  Patient ID: Heather Gillespie, female    DOB: 1961/08/14,  MRN: [id number]  Chief Complaint  Patient presents with   Routine Post Op    POST OP DOS 6.13.22    DOS: 02/25/2021 Procedure: Right fifth metatarsal osteotomy/floating osteotomy  60 y.o. female returns for post-op check.  Patient states she is doing well.  Occasional numbness tingling overall ambulating much better.  She denies any other acute complaints.  Review of Systems: Negative except as noted in the HPI. Denies N/V/F/Ch.  Past Medical History:  Diagnosis Date   Abdominal pain    Allergy    GERD (gastroesophageal reflux disease)    Glaucoma    Hemorrhoids    Hiatal hernia    IBS (irritable bowel syndrome)    N&V (nausea and vomiting)    Osteoporosis    Rectal bleeding    Rectal ulcer    Sinus problem    TMJ (dislocation of temporomandibular joint)    Vitamin D deficiency     Current Outpatient Medications:    alendronate (FOSAMAX) 70 MG tablet, TAKE 1 TABLET BY MOUTH EVERY 7 DAYS. TAKE WITH A FULL GLASS OF WATER ON AN EMPTY STOMACH., Disp: 4 tablet, Rfl: 11   cromolyn (OPTICROM) 4 % ophthalmic solution, INSTILL 1 DROP INTO BOTH EYES 4 TIMES A DAY, Disp: 10 mL, Rfl: 12   DENTA 5000 PLUS 1.1 % CREA dental cream, Take by mouth 2 (two) times daily., Disp: , Rfl:    dexlansoprazole (DEXILANT) 60 MG capsule, Take 60 mg by mouth daily., Disp: , Rfl:    Ergocalciferol (VITAMIN D2) 2000 units TABS, Take 1 tablet by mouth daily., Disp: , Rfl:    fexofenadine (ALLEGRA) 180 MG tablet, TAKE 1 TABLET BY MOUTH EVERY DAY, Disp: 30 tablet, Rfl: 11   fluticasone (FLONASE) 50 MCG/ACT nasal spray, PLACE 2 SPRAYS INTO EACH NOSTRIL ONCE DAILY, Disp: 16 mL, Rfl: 6   gabapentin (NEURONTIN) 300 MG capsule, TAKE 1 CAPSULE BY MOUTH THREE TIMES A DAY, Disp: 90 capsule, Rfl: 3   hydrochlorothiazide (HYDRODIURIL) 25 MG tablet, Take 1 tablet (25 mg total) by mouth daily., Disp: 90 tablet, Rfl: 3   ibuprofen (ADVIL) 800 MG  tablet, TAKE 1 TABLET BY MOUTH EVERY 6 HOURS AS NEEDED, Disp: 60 tablet, Rfl: 1   ibuprofen (ADVIL) 800 MG tablet, Take 1 tablet (800 mg total) by mouth every 6 (six) hours as needed., Disp: 60 tablet, Rfl: 1   losartan (COZAAR) 100 MG tablet, TAKE 1 TABLET BY MOUTH EVERY DAY, Disp: 90 tablet, Rfl: 2   oxyCODONE-acetaminophen (PERCOCET) 5-325 MG tablet, Take 1-2 tablets by mouth every 4 (four) hours as needed for severe pain., Disp: 30 tablet, Rfl: 0   oxyCODONE-acetaminophen (PERCOCET) 5-325 MG tablet, Take 1-2 tablets by mouth every 4 (four) hours as needed for severe pain., Disp: 30 tablet, Rfl: 0   PATADAY 0.2 % SOLN, INSTILL 1 DROP INTO AFFECTED EYE EVERY DAY, Disp: 2.5 mL, Rfl: 11   PATADAY 0.7 % SOLN, INSTILL 1 DROP INTO BOTH EYES EVERY DAY, Disp: 2.5 mL, Rfl: 3   phenazopyridine (PYRIDIUM) 200 MG tablet, Take 1 tablet (200 mg total) by mouth 3 (three) times daily as needed for pain., Disp: 6 tablet, Rfl: 0  Social History   Tobacco Use  Smoking Status Never  Smokeless Tobacco Never    Allergies  Allergen Reactions   Other Other (See Comments)   Hydroxyzine Pamoate Rash   Objective:  There were no vitals filed for  this visit. There is no height or weight on file to calculate BMI. Constitutional Well developed. Well nourished.  Vascular Foot warm and well perfused. Capillary refill normal to all digits.   Neurologic Normal speech. Oriented to person, place, and time. Epicritic sensation to light touch grossly present bilaterally.  Dermatologic Skin healing well without signs of infection. Skin edges well coapted without signs of infection.  Orthopedic: Tenderness to palpation noted about the surgical site.   Radiographs: 3 views of skeletally mature the right foot: Good correction alignment noted.  Metatarsal osteotomy noted.  Beginning signs of consolidation noted Assessment:   1. Plantar flexed metatarsal, right   2. Status post foot surgery    Plan:  Patient was  evaluated and treated and all questions answered.  S/p foot surgery right -Progressing as expected post-operatively. -XR: See above -WB Status: Weightbearing as tolerated in surgical shoe -Sutures: Intact.  No clinical signs of dehiscence noted.  No complication noted. -Medications: None -Foot redressed.  No follow-ups on file.

## 2021-03-20 ENCOUNTER — Other Ambulatory Visit: Payer: Self-pay

## 2021-03-20 ENCOUNTER — Ambulatory Visit (INDEPENDENT_AMBULATORY_CARE_PROVIDER_SITE_OTHER): Payer: Medicaid Other | Admitting: Podiatry

## 2021-03-20 DIAGNOSIS — M216X1 Other acquired deformities of right foot: Secondary | ICD-10-CM

## 2021-03-20 DIAGNOSIS — Z9889 Other specified postprocedural states: Secondary | ICD-10-CM

## 2021-03-21 ENCOUNTER — Other Ambulatory Visit: Payer: Self-pay | Admitting: Podiatry

## 2021-03-21 ENCOUNTER — Encounter: Payer: Self-pay | Admitting: Podiatry

## 2021-03-21 NOTE — Progress Notes (Signed)
Subjective:  Patient ID: Heather Gillespie, female    DOB: 1960-11-17,  MRN: [id number]  Chief Complaint  Patient presents with   Routine Post Op    POST OP DOS 6.13.22    DOS: 02/25/2021 Procedure: Right fifth metatarsal osteotomy/floating osteotomy  60 y.o. female returns for post-op check.  Patient states she is doing well.  Occasional numbness tingling overall ambulating much better.  She denies any other acute complaints.  Review of Systems: Negative except as noted in the HPI. Denies N/V/F/Ch.  Past Medical History:  Diagnosis Date   Abdominal pain    Allergy    GERD (gastroesophageal reflux disease)    Glaucoma    Hemorrhoids    Hiatal hernia    IBS (irritable bowel syndrome)    N&V (nausea and vomiting)    Osteoporosis    Rectal bleeding    Rectal ulcer    Sinus problem    TMJ (dislocation of temporomandibular joint)    Vitamin D deficiency     Current Outpatient Medications:    alendronate (FOSAMAX) 70 MG tablet, TAKE 1 TABLET BY MOUTH EVERY 7 DAYS. TAKE WITH A FULL GLASS OF WATER ON AN EMPTY STOMACH., Disp: 4 tablet, Rfl: 11   cromolyn (OPTICROM) 4 % ophthalmic solution, INSTILL 1 DROP INTO BOTH EYES 4 TIMES A DAY, Disp: 10 mL, Rfl: 12   DENTA 5000 PLUS 1.1 % CREA dental cream, Take by mouth 2 (two) times daily., Disp: , Rfl:    dexlansoprazole (DEXILANT) 60 MG capsule, Take 60 mg by mouth daily., Disp: , Rfl:    Ergocalciferol (VITAMIN D2) 2000 units TABS, Take 1 tablet by mouth daily., Disp: , Rfl:    fexofenadine (ALLEGRA) 180 MG tablet, TAKE 1 TABLET BY MOUTH EVERY DAY, Disp: 30 tablet, Rfl: 11   fluticasone (FLONASE) 50 MCG/ACT nasal spray, PLACE 2 SPRAYS INTO EACH NOSTRIL ONCE DAILY, Disp: 16 mL, Rfl: 6   gabapentin (NEURONTIN) 300 MG capsule, TAKE 1 CAPSULE BY MOUTH THREE TIMES A DAY, Disp: 90 capsule, Rfl: 3   hydrochlorothiazide (HYDRODIURIL) 25 MG tablet, Take 1 tablet (25 mg total) by mouth daily., Disp: 90 tablet, Rfl: 3   ibuprofen (ADVIL) 800 MG  tablet, TAKE 1 TABLET BY MOUTH EVERY 6 HOURS AS NEEDED, Disp: 60 tablet, Rfl: 1   ibuprofen (ADVIL) 800 MG tablet, Take 1 tablet (800 mg total) by mouth every 6 (six) hours as needed., Disp: 60 tablet, Rfl: 1   losartan (COZAAR) 100 MG tablet, TAKE 1 TABLET BY MOUTH EVERY DAY, Disp: 90 tablet, Rfl: 2   oxyCODONE-acetaminophen (PERCOCET) 5-325 MG tablet, Take 1-2 tablets by mouth every 4 (four) hours as needed for severe pain., Disp: 30 tablet, Rfl: 0   oxyCODONE-acetaminophen (PERCOCET) 5-325 MG tablet, Take 1-2 tablets by mouth every 4 (four) hours as needed for severe pain., Disp: 30 tablet, Rfl: 0   PATADAY 0.2 % SOLN, INSTILL 1 DROP INTO AFFECTED EYE EVERY DAY, Disp: 2.5 mL, Rfl: 11   PATADAY 0.7 % SOLN, INSTILL 1 DROP INTO BOTH EYES EVERY DAY, Disp: 2.5 mL, Rfl: 3   phenazopyridine (PYRIDIUM) 200 MG tablet, Take 1 tablet (200 mg total) by mouth 3 (three) times daily as needed for pain., Disp: 6 tablet, Rfl: 0  Social History   Tobacco Use  Smoking Status Never  Smokeless Tobacco Never    Allergies  Allergen Reactions   Other Other (See Comments)   Hydroxyzine Pamoate Rash   Objective:  There were no vitals filed for  this visit. There is no height or weight on file to calculate BMI. Constitutional Well developed. Well nourished.  Vascular Foot warm and well perfused. Capillary refill normal to all digits.   Neurologic Normal speech. Oriented to person, place, and time. Epicritic sensation to light touch grossly present bilaterally.  Dermatologic Skin completely epithelialized.  No clinical signs of infection noted.  Good correction alignment noted  Orthopedic: Mild tenderness to palpation noted about the surgical site.   Radiographs: 3 views of skeletally mature the right foot: Good correction alignment noted.  Metatarsal osteotomy noted.  Beginning signs of consolidation noted Assessment:   1. Plantar flexed metatarsal, right   2. Status post foot surgery     Plan:   Patient was evaluated and treated and all questions answered.  S/p foot surgery right -Progressing as expected post-operatively. -XR: See above -WB Status: Transition to regular shoes -Sutures: Removed.  No clinical signs of dehiscence noted.  No complication noted. -Medications: None -Foot redressed.  No follow-ups on file.

## 2021-03-22 NOTE — Telephone Encounter (Signed)
Please advise 

## 2021-04-01 DIAGNOSIS — R1013 Epigastric pain: Secondary | ICD-10-CM | POA: Diagnosis not present

## 2021-04-01 DIAGNOSIS — R634 Abnormal weight loss: Secondary | ICD-10-CM | POA: Diagnosis not present

## 2021-04-01 DIAGNOSIS — K219 Gastro-esophageal reflux disease without esophagitis: Secondary | ICD-10-CM | POA: Diagnosis not present

## 2021-04-01 DIAGNOSIS — K625 Hemorrhage of anus and rectum: Secondary | ICD-10-CM | POA: Diagnosis not present

## 2021-04-08 ENCOUNTER — Encounter: Payer: Self-pay | Admitting: Family Medicine

## 2021-04-08 DIAGNOSIS — K635 Polyp of colon: Secondary | ICD-10-CM | POA: Diagnosis not present

## 2021-04-08 DIAGNOSIS — R1310 Dysphagia, unspecified: Secondary | ICD-10-CM | POA: Diagnosis not present

## 2021-04-08 DIAGNOSIS — K626 Ulcer of anus and rectum: Secondary | ICD-10-CM | POA: Diagnosis not present

## 2021-04-08 DIAGNOSIS — K625 Hemorrhage of anus and rectum: Secondary | ICD-10-CM | POA: Diagnosis not present

## 2021-04-08 DIAGNOSIS — R634 Abnormal weight loss: Secondary | ICD-10-CM | POA: Diagnosis not present

## 2021-04-08 DIAGNOSIS — K5520 Angiodysplasia of colon without hemorrhage: Secondary | ICD-10-CM | POA: Diagnosis not present

## 2021-04-08 DIAGNOSIS — R1013 Epigastric pain: Secondary | ICD-10-CM | POA: Diagnosis not present

## 2021-04-21 ENCOUNTER — Other Ambulatory Visit: Payer: Self-pay | Admitting: Podiatry

## 2021-04-23 NOTE — Telephone Encounter (Signed)
Please advise 

## 2021-04-25 ENCOUNTER — Other Ambulatory Visit: Payer: Self-pay | Admitting: Family Medicine

## 2021-05-01 ENCOUNTER — Other Ambulatory Visit: Payer: Self-pay

## 2021-05-01 ENCOUNTER — Ambulatory Visit (INDEPENDENT_AMBULATORY_CARE_PROVIDER_SITE_OTHER): Payer: Medicaid Other

## 2021-05-01 ENCOUNTER — Ambulatory Visit (INDEPENDENT_AMBULATORY_CARE_PROVIDER_SITE_OTHER): Payer: Medicaid Other | Admitting: Podiatry

## 2021-05-01 DIAGNOSIS — Z9889 Other specified postprocedural states: Secondary | ICD-10-CM

## 2021-05-01 DIAGNOSIS — M216X1 Other acquired deformities of right foot: Secondary | ICD-10-CM

## 2021-05-01 NOTE — Progress Notes (Signed)
Subjective:  Patient ID: Heather Gillespie, female    DOB: [birth year],  MRN: [id number]  Chief Complaint  Patient presents with   Routine Post Benard Rink 6.13.22    DOS: 02/25/2021 Procedure: Right fifth metatarsal osteotomy/floating osteotomy  60 y.o. female returns for post-op check.  Patient states she is doing well.  Occasional numbness tingling overall ambulating much better.  She denies any other acute complaints.  Review of Systems: Negative except as noted in the HPI. Denies N/V/F/Ch.  Past Medical History:  Diagnosis Date   Abdominal pain    Allergy    GERD (gastroesophageal reflux disease)    Glaucoma    Hemorrhoids    Hiatal hernia    IBS (irritable bowel syndrome)    N&V (nausea and vomiting)    Osteoporosis    Rectal bleeding    Rectal ulcer    Sinus problem    TMJ (dislocation of temporomandibular joint)    Vitamin D deficiency     Current Outpatient Medications:    alendronate (FOSAMAX) 70 MG tablet, TAKE 1 TABLET BY MOUTH EVERY 7 DAYS. TAKE WITH A FULL GLASS OF WATER ON AN EMPTY STOMACH., Disp: 4 tablet, Rfl: 11   cromolyn (OPTICROM) 4 % ophthalmic solution, INSTILL 1 DROP INTO BOTH EYES 4 TIMES A DAY, Disp: 10 mL, Rfl: 12   DENTA 5000 PLUS 1.1 % CREA dental cream, Take by mouth 2 (two) times daily., Disp: , Rfl:    dexlansoprazole (DEXILANT) 60 MG capsule, Take 60 mg by mouth daily., Disp: , Rfl:    Ergocalciferol (VITAMIN D2) 2000 units TABS, Take 1 tablet by mouth daily., Disp: , Rfl:    fexofenadine (ALLEGRA) 180 MG tablet, TAKE 1 TABLET BY MOUTH EVERY DAY, Disp: 30 tablet, Rfl: 11   fluticasone (FLONASE) 50 MCG/ACT nasal spray, PLACE 2 SPRAYS INTO EACH NOSTRIL ONCE DAILY, Disp: 16 mL, Rfl: 6   gabapentin (NEURONTIN) 300 MG capsule, TAKE 1 CAPSULE BY MOUTH THREE TIMES A DAY, Disp: 90 capsule, Rfl: 3   hydrochlorothiazide (HYDRODIURIL) 25 MG tablet, Take 1 tablet (25 mg total) by mouth daily., Disp: 90 tablet, Rfl: 3   ibuprofen (ADVIL) 800 MG tablet,  TAKE 1 TABLET BY MOUTH EVERY 6 HOURS AS NEEDED, Disp: 60 tablet, Rfl: 1   ibuprofen (ADVIL) 800 MG tablet, TAKE 1 TABLET BY MOUTH EVERY 6 HOURS AS NEEDED, Disp: 60 tablet, Rfl: 1   losartan (COZAAR) 100 MG tablet, TAKE 1 TABLET BY MOUTH EVERY DAY, Disp: 90 tablet, Rfl: 2   oxyCODONE-acetaminophen (PERCOCET) 5-325 MG tablet, Take 1-2 tablets by mouth every 4 (four) hours as needed for severe pain., Disp: 30 tablet, Rfl: 0   oxyCODONE-acetaminophen (PERCOCET) 5-325 MG tablet, Take 1-2 tablets by mouth every 4 (four) hours as needed for severe pain., Disp: 30 tablet, Rfl: 0   PATADAY 0.2 % SOLN, INSTILL 1 DROP INTO AFFECTED EYE EVERY DAY, Disp: 2.5 mL, Rfl: 11   PATADAY 0.7 % SOLN, INSTILL 1 DROP INTO BOTH EYES EVERY DAY, Disp: 2.5 mL, Rfl: 3   phenazopyridine (PYRIDIUM) 200 MG tablet, Take 1 tablet (200 mg total) by mouth 3 (three) times daily as needed for pain., Disp: 6 tablet, Rfl: 0  Social History   Tobacco Use  Smoking Status Never  Smokeless Tobacco Never    Allergies  Allergen Reactions   Other Other (See Comments)   Hydroxyzine Pamoate Rash   Objective:  There were no vitals filed for this visit. There is no height  or weight on file to calculate BMI. Constitutional Well developed. Well nourished.  Vascular Foot warm and well perfused. Capillary refill normal to all digits.   Neurologic Normal speech. Oriented to person, place, and time. Epicritic sensation to light touch grossly present bilaterally.  Dermatologic Skin completely epithelialized.  No clinical signs of infection noted.  Good correction alignment noted  Orthopedic: Mild tenderness to palpation noted about the surgical site.   Radiographs: 3 views of skeletally mature the right foot: Good correction alignment noted.  Metatarsal osteotomy noted.  Beginning signs of consolidation noted Assessment:   1. Status post foot surgery   2. Plantar flexed metatarsal, right     Plan:  Patient was evaluated and treated  and all questions answered.  S/p foot surgery right -Progressing as expected post-operatively. -XR: See above -WB Status: Transition to regular shoes -Sutures: Removed.  No clinical signs of dehiscence noted.  No complication noted. -Medications: None -Patient is doing well.  At this time patient is clinically healed and is her pain has clinically resolved.  I have asked her to discuss shoe gear modification which she has done.  If any foot and ankle issues arise the office or come see me.  She states understanding  No follow-ups on file.

## 2021-05-11 ENCOUNTER — Other Ambulatory Visit: Payer: Self-pay | Admitting: Podiatry

## 2021-05-13 NOTE — Telephone Encounter (Signed)
Please advise 

## 2021-05-16 ENCOUNTER — Other Ambulatory Visit: Payer: Self-pay | Admitting: Family Medicine

## 2021-05-30 ENCOUNTER — Other Ambulatory Visit: Payer: Self-pay | Admitting: Podiatry

## 2021-05-30 ENCOUNTER — Other Ambulatory Visit: Payer: Self-pay | Admitting: Family Medicine

## 2021-05-30 DIAGNOSIS — Z1231 Encounter for screening mammogram for malignant neoplasm of breast: Secondary | ICD-10-CM

## 2021-05-31 NOTE — Telephone Encounter (Signed)
Medication refilled

## 2021-06-03 ENCOUNTER — Other Ambulatory Visit: Payer: Self-pay

## 2021-06-03 ENCOUNTER — Ambulatory Visit (INDEPENDENT_AMBULATORY_CARE_PROVIDER_SITE_OTHER): Payer: Medicaid Other | Admitting: Family Medicine

## 2021-06-03 ENCOUNTER — Encounter: Payer: Self-pay | Admitting: Family Medicine

## 2021-06-03 VITALS — BP 120/60 | HR 66 | Resp 16 | Ht 62.0 in | Wt 108.0 lb

## 2021-06-03 DIAGNOSIS — R300 Dysuria: Secondary | ICD-10-CM | POA: Diagnosis not present

## 2021-06-03 DIAGNOSIS — R310 Gross hematuria: Secondary | ICD-10-CM

## 2021-06-03 DIAGNOSIS — M810 Age-related osteoporosis without current pathological fracture: Secondary | ICD-10-CM

## 2021-06-03 DIAGNOSIS — E7800 Pure hypercholesterolemia, unspecified: Secondary | ICD-10-CM | POA: Diagnosis not present

## 2021-06-03 DIAGNOSIS — Z0000 Encounter for general adult medical examination without abnormal findings: Secondary | ICD-10-CM

## 2021-06-03 DIAGNOSIS — I10 Essential (primary) hypertension: Secondary | ICD-10-CM | POA: Diagnosis not present

## 2021-06-03 MED ORDER — SULFAMETHOXAZOLE-TRIMETHOPRIM 800-160 MG PO TABS: 1.0000 | ORAL_TABLET | Freq: Two times a day (BID) | ORAL | 0 refills | Status: DC

## 2021-06-03 NOTE — Progress Notes (Signed)
Subjective:    Patient ID: Heather Gillespie, female    DOB: 1961-03-20, 60 y.o.   MRN: [id number]  HPI  Patient is here today for complete physical exam.  She is due for a flu shot as well as the shingles vaccine.  She declines both of those due to her family history of Guillain-Barr syndrome in her mom.  She is also due for Covid booster.  Patient had a colonoscopy done this year which revealed 8 polyps.  She is followed closely by GI and they are going to recheck her in 3 years.  Her last Pap smear was performed in 2020 and is not due again until 2023.  She had a bone density test in 2020 that revealed osteoporosis.  She is currently on Fosamax as well as calcium and vitamin D.  She is due to repeat her bone density this year.  She is already scheduled her mammogram for October 26.  Therefore this is pending.  Of note, she reports 4 to 5 days of dysuria and frequency and urgency.  On examination today, she has tea colored urine.  It is so dark that are machine is unable to analyze it.  She denies any vaginal bleeding.  She states that she has never seen at this dark.  I just started this morning.  My concern would be gross hematuria or myoglobinuria.   Past Medical History:  Diagnosis Date  . Abdominal pain   . Allergy   . GERD (gastroesophageal reflux disease)   . Glaucoma   . Hemorrhoids   . Hiatal hernia   . IBS (irritable bowel syndrome)   . N&V (nausea and vomiting)   . Osteoporosis   . Rectal bleeding   . Rectal ulcer   . Sinus problem   . TMJ (dislocation of temporomandibular joint)   . Vitamin D deficiency    Past Surgical History:  Procedure Laterality Date  . ANAL FISSURE REPAIR    . BACK SURGERY    . CHOLECYSTECTOMY  1984  . COLONOSCOPY W/ POLYPECTOMY     colon ulcers  . EYE SURGERY    . HEMI-MICRODISCECTOMY LUMBAR LAMINECTOMY LEVEL 1 Left 05/26/2013   Procedure: HEMI-MICRODISCECTOMY LUMBAR LAMINECTOMY L4 - L5 ON THE LEFT LEVEL 1;  Surgeon: Tobi Bastos, MD;   Location: [facility];  Service: Orthopedics;  Laterality: Left;  . HEMORROIDECTOMY     Current Outpatient Medications on File Prior to Visit  Medication Sig Dispense Refill  . alendronate (FOSAMAX) 70 MG tablet TAKE 1 TABLET BY MOUTH EVERY 7 DAYS. TAKE WITH A FULL GLASS OF WATER ON AN EMPTY STOMACH. 4 tablet 11  . cromolyn (OPTICROM) 4 % ophthalmic solution INSTILL 1 DROP INTO BOTH EYES 4 TIMES A DAY 10 mL 12  . DENTA 5000 PLUS 1.1 % CREA dental cream Take by mouth 2 (two) times daily.    Marland Kitchen dexlansoprazole (DEXILANT) 60 MG capsule Take 60 mg by mouth daily.    . Ergocalciferol (VITAMIN D2) 2000 units TABS Take 1 tablet by mouth daily.    . fexofenadine (ALLEGRA) 180 MG tablet TAKE 1 TABLET BY MOUTH EVERY DAY 30 tablet 11  . fluticasone (FLONASE) 50 MCG/ACT nasal spray PLACE 2 SPRAYS INTO EACH NOSTRIL ONCE DAILY 16 mL 6  . gabapentin (NEURONTIN) 300 MG capsule TAKE 1 CAPSULE BY MOUTH THREE TIMES A DAY 90 capsule 3  . hydrochlorothiazide (HYDRODIURIL) 25 MG tablet Take 1 tablet (25 mg total) by mouth daily. 90 tablet 3  .  ibuprofen (ADVIL) 800 MG tablet TAKE 1 TABLET BY MOUTH EVERY 6 HOURS AS NEEDED 60 tablet 1  . ibuprofen (ADVIL) 800 MG tablet TAKE 1 TABLET BY MOUTH EVERY 6 HOURS AS NEEDED 60 tablet 1  . losartan (COZAAR) 100 MG tablet TAKE 1 TABLET BY MOUTH EVERY DAY 30 tablet 0  . oxyCODONE-acetaminophen (PERCOCET) 5-325 MG tablet Take 1-2 tablets by mouth every 4 (four) hours as needed for severe pain. 30 tablet 0  . oxyCODONE-acetaminophen (PERCOCET) 5-325 MG tablet Take 1-2 tablets by mouth every 4 (four) hours as needed for severe pain. 30 tablet 0  . PATADAY 0.2 % SOLN INSTILL 1 DROP INTO AFFECTED EYE EVERY DAY 2.5 mL 11  . PATADAY 0.7 % SOLN INSTILL 1 DROP INTO BOTH EYES EVERY DAY 2.5 mL 3  . phenazopyridine (PYRIDIUM) 200 MG tablet Take 1 tablet (200 mg total) by mouth 3 (three) times daily as needed for pain. 6 tablet 0   No current facility-administered medications on file prior to  visit.   Allergies  Allergen Reactions  . Other Other (See Comments)  . Hydroxyzine Pamoate Rash   Social History   Socioeconomic History  . Marital status: Single    Spouse name: Not on file  . Number of children: Not on file  . Years of education: Not on file  . Highest education level: Not on file  Occupational History  . Not on file  Tobacco Use  . Smoking status: Never  . Smokeless tobacco: Never  Vaping Use  . Vaping Use: Never used  Substance and Sexual Activity  . Alcohol use: No  . Drug use: No  . Sexual activity: Not Currently  Other Topics Concern  . Not on file  Social History Narrative  . Not on file   Social Determinants of Health   Financial Resource Strain: Not on file  Food Insecurity: Not on file  Transportation Needs: Not on file  Physical Activity: Not on file  Stress: Not on file  Social Connections: Not on file  Intimate Partner Violence: Not on file   Family History  Problem Relation Age of Onset  . Diabetes Mother   . Cancer Mother   . Stroke Father   . Breast cancer Neg Hx       Review of Systems  All other systems reviewed and are negative.     Objective:   Physical Exam Vitals reviewed.  Constitutional:      General: She is not in acute distress.    Appearance: Normal appearance. She is well-developed. She is not ill-appearing, toxic-appearing or diaphoretic.  HENT:     Head: Normocephalic and atraumatic.     Right Ear: Tympanic membrane, ear canal and external ear normal.     Left Ear: Tympanic membrane, ear canal and external ear normal.     Nose: Nose normal. No congestion or rhinorrhea.     Mouth/Throat:     Mouth: Mucous membranes are moist.     Pharynx: No oropharyngeal exudate or posterior oropharyngeal erythema.  Eyes:     General: No scleral icterus.       Right eye: No discharge.        Left eye: No discharge.     Extraocular Movements: Extraocular movements intact.     Conjunctiva/sclera: Conjunctivae  normal.     Pupils: Pupils are equal, round, and reactive to light.  Neck:     Thyroid: No thyromegaly.     Vascular: No carotid bruit or JVD.  Trachea: No tracheal deviation.  Cardiovascular:     Rate and Rhythm: Normal rate and regular rhythm.     Pulses: Normal pulses.     Heart sounds: Normal heart sounds. No murmur heard.   No friction rub. No gallop.  Pulmonary:     Effort: Pulmonary effort is normal. No respiratory distress.     Breath sounds: Normal breath sounds. No stridor. No wheezing, rhonchi or rales.  Chest:     Chest wall: No tenderness.  Abdominal:     General: Bowel sounds are normal. There is no distension.     Palpations: Abdomen is soft. There is no mass.     Tenderness: There is no abdominal tenderness. There is no right CVA tenderness, left CVA tenderness, guarding or rebound.     Hernia: No hernia is present.  Genitourinary:    Cervix: No cervical motion tenderness, discharge or friability.     Adnexa:        Right: No mass or tenderness.         Left: No mass or tenderness.    Musculoskeletal:        General: No tenderness. Normal range of motion.     Cervical back: Normal range of motion and neck supple. No rigidity or tenderness.     Right lower leg: No edema.     Left lower leg: No edema.  Lymphadenopathy:     Cervical: No cervical adenopathy.  Skin:    General: Skin is warm.     Coloration: Skin is not jaundiced or pale.     Findings: No bruising, erythema, lesion or rash.  Neurological:     General: No focal deficit present.     Mental Status: She is alert and oriented to person, place, and time. Mental status is at baseline.     Cranial Nerves: No cranial nerve deficit.     Sensory: No sensory deficit.     Motor: No weakness or abnormal muscle tone.     Coordination: Coordination normal.     Gait: Gait normal.     Deep Tendon Reflexes: Reflexes are normal and symmetric. Reflexes normal.  Psychiatric:        Mood and Affect: Mood normal.         Behavior: Behavior normal.        Thought Content: Thought content normal.        Judgment: Judgment normal.          Assessment & Plan:  General medical exam - Plan: CBC with Differential/Platelet, COMPLETE METABOLIC PANEL WITH GFR, Lipid panel  Benign essential HTN - Plan: CBC with Differential/Platelet, COMPLETE METABOLIC PANEL WITH GFR, Lipid panel  Pure hypercholesterolemia - Plan: CBC with Differential/Platelet, COMPLETE METABOLIC PANEL WITH GFR, Lipid panel  Osteoporosis, unspecified osteoporosis type, unspecified pathological fracture presence - Plan: DG Bone Density  Dysuria - Plan: Urinalysis, Routine w reflex microscopic, Urine Culture  Gross hematuria - Plan: Urine Culture Patient is adamant that the sample was obtained from the bleeding that occurs only when she urinates.  She denies any vaginal bleeding.  Therefore she has tea colored urine and dysuria.  I am concerned about a bladder infection.  However her urinalysis is unable to analyze the sample.  I will treat her symptomatically with Bactrim double strength tablets twice daily for 7 days.  I will send a urine culture to confirm.  I want to recheck the urine sample in 1 to 2 weeks off the antibiotics to ensure resolution.  Patient has gross hematuria and if this persists, she would need a CT scan of the abdomen and pelvis to evaluate the kidneys as well as a cystoscopy.  She is to contact me immediately if symptoms worsen.  I will repeat a bone density test to monitor her osteoporosis.  Blood pressure today is outstanding but I will check a CBC CMP and a lipid panel.  Patient declines flu shot, shingles shot, and COVID.  Mammogram is already scheduled.  Pap smear is due next year.  Colonoscopy is up-to-date

## 2021-06-04 ENCOUNTER — Encounter: Payer: Self-pay | Admitting: *Deleted

## 2021-06-04 LAB — COMPLETE METABOLIC PANEL WITH GFR
AG Ratio: 1.6 (calc) (ref 1.0–2.5)
ALT: 11 U/L (ref 6–29)
AST: 16 U/L (ref 10–35)
Albumin: 4.5 g/dL (ref 3.6–5.1)
Alkaline phosphatase (APISO): 48 U/L (ref 37–153)
BUN: 16 mg/dL (ref 7–25)
CO2: 28 mmol/L (ref 20–32)
Calcium: 9.9 mg/dL (ref 8.6–10.4)
Chloride: 98 mmol/L (ref 98–110)
Creat: 0.76 mg/dL (ref 0.50–1.05)
Globulin: 2.9 g/dL (calc) (ref 1.9–3.7)
Glucose, Bld: 80 mg/dL (ref 65–99)
Potassium: 4.1 mmol/L (ref 3.5–5.3)
Sodium: 135 mmol/L (ref 135–146)
Total Bilirubin: 0.8 mg/dL (ref 0.2–1.2)
Total Protein: 7.4 g/dL (ref 6.1–8.1)
eGFR: 90 mL/min/{1.73_m2} (ref >=60)

## 2021-06-04 LAB — CBC WITH DIFFERENTIAL/PLATELET
Absolute Monocytes: 344 cells/uL (ref 200–950)
Basophils Absolute: 22 cells/uL (ref 0–200)
Basophils Relative: 0.5 %
Eosinophils Absolute: 30 cells/uL (ref 15–500)
Eosinophils Relative: 0.7 %
HCT: 37.8 % (ref 35.0–45.0)
Hemoglobin: 12.6 g/dL (ref 11.7–15.5)
Lymphs Abs: 1174 cells/uL (ref 850–3900)
MCH: 31.7 pg (ref 27.0–33.0)
MCHC: 33.3 g/dL (ref 32.0–36.0)
MCV: 95 fL (ref 80.0–100.0)
MPV: 10.4 fL (ref 7.5–12.5)
Monocytes Relative: 8 %
Neutro Abs: 2731 cells/uL (ref 1500–7800)
Neutrophils Relative %: 63.5 %
Platelets: 191 10*3/uL (ref 140–400)
RBC: 3.98 10*6/uL (ref 3.80–5.10)
RDW: 11.8 % (ref 11.0–15.0)
Total Lymphocyte: 27.3 %
WBC: 4.3 10*3/uL (ref 3.8–10.8)

## 2021-06-04 LAB — LIPID PANEL
Cholesterol: 206 mg/dL — ABNORMAL HIGH (ref <200)
HDL: 86 mg/dL (ref >=50)
LDL Cholesterol (Calc): 104 mg/dL (calc) — ABNORMAL HIGH
Non-HDL Cholesterol (Calc): 120 mg/dL (calc) (ref <130)
Total CHOL/HDL Ratio: 2.4 (calc) (ref <5.0)
Triglycerides: 69 mg/dL (ref <150)

## 2021-06-06 LAB — URINE CULTURE
MICRO NUMBER:: 12391461
SPECIMEN QUALITY:: ADEQUATE

## 2021-06-17 ENCOUNTER — Other Ambulatory Visit: Payer: Self-pay

## 2021-06-17 ENCOUNTER — Ambulatory Visit (INDEPENDENT_AMBULATORY_CARE_PROVIDER_SITE_OTHER): Payer: Medicaid Other | Admitting: Family Medicine

## 2021-06-17 VITALS — BP 120/80 | HR 80 | Temp 98.0°F | Resp 18 | Wt 108.0 lb

## 2021-06-17 DIAGNOSIS — R310 Gross hematuria: Secondary | ICD-10-CM | POA: Diagnosis not present

## 2021-06-17 LAB — URINALYSIS, ROUTINE W REFLEX MICROSCOPIC
Bacteria, UA: NONE SEEN /HPF
Bilirubin Urine: NEGATIVE
Glucose, UA: NEGATIVE
Hyaline Cast: NONE SEEN /LPF
Ketones, ur: NEGATIVE
Leukocytes,Ua: NEGATIVE
Nitrite: NEGATIVE
Protein, ur: NEGATIVE
Specific Gravity, Urine: 1.02 (ref 1.001–1.035)
WBC, UA: NONE SEEN /HPF (ref 0–5)
pH: 5.5 (ref 5.0–8.0)

## 2021-06-17 LAB — MICROSCOPIC MESSAGE

## 2021-06-17 NOTE — Progress Notes (Signed)
Subjective:    Patient ID: Heather Gillespie, female    DOB: 29-Feb-1961, 60 y.o.   MRN: [id number]  HPI  Please see last office visit.  Patient had gross hematuria.  Her urine was tea colored almost opaque.  Urinalysis showed significant blood.  However urine culture confirmed Enterococcus faecalis.  The patient was treated with antibiotics and states that she is no longer burning.  She no longer has any discomfort.  She denies any color change to her urine.  She states that it is clear. Past Medical History:  Diagnosis Date   Abdominal pain    Allergy    GERD (gastroesophageal reflux disease)    Glaucoma    Hemorrhoids    Hiatal hernia    IBS (irritable bowel syndrome)    N&V (nausea and vomiting)    Osteoporosis    Rectal bleeding    Rectal ulcer    Sinus problem    TMJ (dislocation of temporomandibular joint)    Vitamin D deficiency    Past Surgical History:  Procedure Laterality Date   ANAL FISSURE REPAIR     BACK SURGERY     CHOLECYSTECTOMY  1984   COLONOSCOPY W/ POLYPECTOMY     colon ulcers   EYE SURGERY     HEMI-MICRODISCECTOMY LUMBAR LAMINECTOMY LEVEL 1 Left 05/26/2013   Procedure: HEMI-MICRODISCECTOMY LUMBAR LAMINECTOMY L4 - L5 ON THE LEFT LEVEL 1;  Surgeon: Tobi Bastos, MD;  Location: [facility];  Service: Orthopedics;  Laterality: Left;   HEMORROIDECTOMY     Current Outpatient Medications on File Prior to Visit  Medication Sig Dispense Refill   alendronate (FOSAMAX) 70 MG tablet TAKE 1 TABLET BY MOUTH EVERY 7 DAYS. TAKE WITH A FULL GLASS OF WATER ON AN EMPTY STOMACH. 4 tablet 11   cromolyn (OPTICROM) 4 % ophthalmic solution INSTILL 1 DROP INTO BOTH EYES 4 TIMES A DAY 10 mL 12   DENTA 5000 PLUS 1.1 % CREA dental cream Take by mouth 2 (two) times daily.     dexlansoprazole (DEXILANT) 60 MG capsule Take 60 mg by mouth daily.     Ergocalciferol (VITAMIN D2) 2000 units TABS Take 1 tablet by mouth daily.     fexofenadine (ALLEGRA) 180 MG tablet TAKE 1 TABLET BY  MOUTH EVERY DAY 30 tablet 11   fluticasone (FLONASE) 50 MCG/ACT nasal spray PLACE 2 SPRAYS INTO EACH NOSTRIL ONCE DAILY 16 mL 6   gabapentin (NEURONTIN) 300 MG capsule TAKE 1 CAPSULE BY MOUTH THREE TIMES A DAY 90 capsule 3   hydrochlorothiazide (HYDRODIURIL) 25 MG tablet Take 1 tablet (25 mg total) by mouth daily. 90 tablet 3   ibuprofen (ADVIL) 800 MG tablet TAKE 1 TABLET BY MOUTH EVERY 6 HOURS AS NEEDED 60 tablet 1   ibuprofen (ADVIL) 800 MG tablet TAKE 1 TABLET BY MOUTH EVERY 6 HOURS AS NEEDED 60 tablet 1   losartan (COZAAR) 100 MG tablet TAKE 1 TABLET BY MOUTH EVERY DAY 30 tablet 0   oxyCODONE-acetaminophen (PERCOCET) 5-325 MG tablet Take 1-2 tablets by mouth every 4 (four) hours as needed for severe pain. 30 tablet 0   oxyCODONE-acetaminophen (PERCOCET) 5-325 MG tablet Take 1-2 tablets by mouth every 4 (four) hours as needed for severe pain. 30 tablet 0   PATADAY 0.2 % SOLN INSTILL 1 DROP INTO AFFECTED EYE EVERY DAY 2.5 mL 11   PATADAY 0.7 % SOLN INSTILL 1 DROP INTO BOTH EYES EVERY DAY 2.5 mL 3   phenazopyridine (PYRIDIUM) 200 MG tablet Take  1 tablet (200 mg total) by mouth 3 (three) times daily as needed for pain. 6 tablet 0   sulfamethoxazole-trimethoprim (BACTRIM DS) 800-160 MG tablet Take 1 tablet by mouth 2 (two) times daily. 14 tablet 0   No current facility-administered medications on file prior to visit.   Allergies  Allergen Reactions   Other Other (See Comments)   Hydroxyzine Pamoate Rash   Social History   Socioeconomic History   Marital status: Single    Spouse name: Not on file   Number of children: Not on file   Years of education: Not on file   Highest education level: Not on file  Occupational History   Not on file  Tobacco Use   Smoking status: Never   Smokeless tobacco: Never  Vaping Use   Vaping Use: Never used  Substance and Sexual Activity   Alcohol use: No   Drug use: No   Sexual activity: Not Currently  Other Topics Concern   Not on file  Social  History Narrative   Not on file   Social Determinants of Health   Financial Resource Strain: Not on file  Food Insecurity: Not on file  Transportation Needs: Not on file  Physical Activity: Not on file  Stress: Not on file  Social Connections: Not on file  Intimate Partner Violence: Not on file     Review of Systems  All other systems reviewed and are negative.     Objective:   Physical Exam Vitals reviewed.  Constitutional:      Appearance: Normal appearance. She is normal weight.  Cardiovascular:     Rate and Rhythm: Normal rate and regular rhythm.     Heart sounds: Normal heart sounds.  Pulmonary:     Effort: Pulmonary effort is normal.     Breath sounds: Normal breath sounds.  Abdominal:     General: Bowel sounds are normal.     Palpations: Abdomen is soft.  Neurological:     Mental Status: She is alert.          Assessment & Plan:   Gross hematuria - Plan: Urinalysis, Routine w reflex microscopic, Urine Culture Urinalysis is completely normal except for trace blood.  States that she feels much better.  Although its not 100% gone, I do not feel that we need to be concerned about bladder cancer or any malignancy within the the urinary system.  I would like to repeat a urinalysis in a month at her convenience.  She does not have to make an appointment.  She can just swing by and give [place] a urine sample just to ensure that the hematuria resolves.

## 2021-06-19 LAB — URINE CULTURE
MICRO NUMBER:: 12452001
SPECIMEN QUALITY:: ADEQUATE

## 2021-06-21 ENCOUNTER — Other Ambulatory Visit: Payer: Self-pay | Admitting: Podiatry

## 2021-06-21 ENCOUNTER — Encounter: Payer: Self-pay | Admitting: *Deleted

## 2021-06-23 ENCOUNTER — Other Ambulatory Visit: Payer: Self-pay | Admitting: Family Medicine

## 2021-07-10 DIAGNOSIS — Z86010 Personal history of colonic polyps: Secondary | ICD-10-CM | POA: Diagnosis not present

## 2021-07-10 DIAGNOSIS — K625 Hemorrhage of anus and rectum: Secondary | ICD-10-CM | POA: Diagnosis not present

## 2021-07-11 ENCOUNTER — Other Ambulatory Visit: Payer: Self-pay

## 2021-07-11 ENCOUNTER — Ambulatory Visit
Admission: RE | Admit: 2021-07-11 | Discharge: 2021-07-11 | Disposition: A | Discharge status: Home / Self Care | Payer: Medicaid Other | Source: Ambulatory Visit | Attending: Family Medicine | Admitting: Family Medicine

## 2021-07-11 DIAGNOSIS — Z1231 Encounter for screening mammogram for malignant neoplasm of breast: Secondary | ICD-10-CM

## 2021-07-15 ENCOUNTER — Other Ambulatory Visit: Payer: Self-pay | Admitting: Podiatry

## 2021-07-17 ENCOUNTER — Other Ambulatory Visit: Payer: Medicaid Other

## 2021-07-17 ENCOUNTER — Other Ambulatory Visit: Payer: Self-pay

## 2021-07-31 ENCOUNTER — Other Ambulatory Visit: Payer: Self-pay | Admitting: Family Medicine

## 2021-08-04 ENCOUNTER — Other Ambulatory Visit: Payer: Self-pay | Admitting: Podiatry

## 2021-08-05 NOTE — Telephone Encounter (Signed)
Please advise 

## 2021-08-13 ENCOUNTER — Other Ambulatory Visit: Payer: Self-pay | Admitting: *Deleted

## 2021-08-13 MED ORDER — GABAPENTIN 300 MG PO CAPS: ORAL_CAPSULE | ORAL | 3 refills | Status: DC

## 2021-08-27 ENCOUNTER — Other Ambulatory Visit: Payer: Self-pay | Admitting: Podiatry

## 2021-08-30 ENCOUNTER — Other Ambulatory Visit: Payer: Self-pay

## 2021-08-30 MED ORDER — LOSARTAN POTASSIUM 100 MG PO TABS: 100.0000 mg | ORAL_TABLET | Freq: Every day | ORAL | 3 refills | Status: DC

## 2021-09-10 MED ORDER — HYDROCHLOROTHIAZIDE 25 MG PO TABS: 25.0000 mg | ORAL_TABLET | Freq: Every day | ORAL | 3 refills | Status: DC

## 2021-09-10 NOTE — Addendum Note (Signed)
Addended by: Jaynie Crumble on: 09/10/2021 01:53 PM   Modules accepted: Orders

## 2021-10-04 ENCOUNTER — Other Ambulatory Visit: Payer: Self-pay

## 2021-10-04 MED ORDER — FEXOFENADINE HCL 180 MG PO TABS: 180.0000 mg | ORAL_TABLET | Freq: Every day | ORAL | 3 refills | Status: DC

## 2021-10-15 ENCOUNTER — Other Ambulatory Visit: Payer: Self-pay | Admitting: Family Medicine

## 2021-11-04 ENCOUNTER — Other Ambulatory Visit: Payer: Self-pay | Admitting: Family Medicine

## 2021-11-04 DIAGNOSIS — M810 Age-related osteoporosis without current pathological fracture: Secondary | ICD-10-CM

## 2021-11-09 ENCOUNTER — Ambulatory Visit (HOSPITAL_COMMUNITY)
Admission: EM | Admit: 2021-11-09 | Discharge: 2021-11-09 | Disposition: A | Discharge status: Home / Self Care | Payer: Medicaid Other | Attending: Nurse Practitioner | Admitting: Nurse Practitioner

## 2021-11-09 ENCOUNTER — Encounter (HOSPITAL_COMMUNITY): Payer: Self-pay

## 2021-11-09 ENCOUNTER — Other Ambulatory Visit: Payer: Self-pay

## 2021-11-09 DIAGNOSIS — R310 Gross hematuria: Secondary | ICD-10-CM

## 2021-11-09 DIAGNOSIS — R300 Dysuria: Secondary | ICD-10-CM

## 2021-11-09 LAB — POCT URINALYSIS DIPSTICK, ED / UC
Bilirubin Urine: NEGATIVE
Glucose, UA: NEGATIVE mg/dL
Ketones, ur: NEGATIVE mg/dL
Leukocytes,Ua: NEGATIVE
Nitrite: NEGATIVE
Protein, ur: 100 mg/dL — AB
Specific Gravity, Urine: 1.025 (ref 1.005–1.030)
Urobilinogen, UA: 0.2 mg/dL (ref 0.0–1.0)
pH: 5 (ref 5.0–8.0)

## 2021-11-09 MED ORDER — CEPHALEXIN 500 MG PO CAPS: 500.0000 mg | ORAL_CAPSULE | Freq: Four times a day (QID) | ORAL | 0 refills | Status: AC

## 2021-11-09 NOTE — ED Provider Notes (Signed)
[facility]    CSN: [id number] Arrival date & time: 11/09/21  1004      History   Chief Complaint No chief complaint on file.   HPI Heather Gillespie is a 61 y.o. female.   Patient reports 1 week history of urinary tract infection symptoms.  She has had these frequently in the past.  Reports increased burning with urination, increased frequency, voiding smaller amounts, blood in her urine, abdominal pain, and back pain.  She also endorses suprapubic pain and nausea.  Denies fevers, vomiting, vaginal discharge.  She has tried increasing fluids to help with her symptoms.  Most recently treated in September 2022 with Bactrim for urinary tract infection.  She reports symptoms completely resolved after this occurrence.   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 05/26/2013   Vitamin D deficiency    TMJ (dislocation of temporomandibular joint)    Osteoporosis    Rectal pain 03/31/2011   ANEMIA, IRON DEFICIENCY NOS 02/19/2007   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   HEMORRHOIDS NOS, W/O COMPLICATIONS 40/98/1191   RHINITIS, ALLERGIC NOS 02/19/2007   IBS 02/19/2007   DIARRHEA, CHRONIC 02/19/2007   UTI (urinary tract infection) 02/19/2007    Past Surgical History:  Procedure Laterality Date   ANAL FISSURE REPAIR     BACK SURGERY     CHOLECYSTECTOMY  1984   COLONOSCOPY W/ POLYPECTOMY     colon ulcers   EYE SURGERY     HEMI-MICRODISCECTOMY LUMBAR LAMINECTOMY  LEVEL 1 Left 05/26/2013   Procedure: HEMI-MICRODISCECTOMY LUMBAR LAMINECTOMY L4 - L5 ON THE LEFT LEVEL 1;  Surgeon: Tobi Bastos, MD;  Location: [facility];  Service: Orthopedics;  Laterality: Left;   HEMORROIDECTOMY      OB History   No obstetric history on file.      Home Medications    Prior to Admission medications   Medication Sig Start Date End Date Taking? Authorizing Provider  cephALEXin (KEFLEX) 500 MG capsule Take 1 capsule (500 mg total) by mouth every 6 (six) hours for 5 days. 11/09/21 11/14/21 Yes Eulogio Bear, NP  alendronate (FOSAMAX) 70 MG tablet TAKE 1 TABLET BY MOUTH EVERY 7 DAYS. TAKE WITH A FULL GLASS OF WATER ON AN EMPTY STOMACH. 11/06/21   Susy Frizzle, MD  cromolyn (OPTICROM) 4 % ophthalmic solution INSTILL 1 DROP INTO BOTH EYES 4 TIMES A DAY 03/11/21   Susy Frizzle, MD  DENTA 5000 PLUS 1.1 % CREA dental cream Take by mouth 2 (two) times daily. 05/15/20   [provider]  dexlansoprazole (DEXILANT) 60 MG capsule Take 60 mg by mouth daily.    [provider]  Ergocalciferol (VITAMIN D2) 2000 units TABS Take 1 tablet by mouth daily.    [provider]  fexofenadine (ALLEGRA) 180 MG tablet Take 1 tablet (180 mg total) by mouth daily. 10/04/21   Jenna Luo  T, MD  fluticasone (FLONASE) 50 MCG/ACT nasal spray PLACE 2 SPRAYS INTO EACH NOSTRIL ONCE DAILY 11/27/20   Susy Frizzle, MD  gabapentin (NEURONTIN) 300 MG capsule 1 capsule TID 08/13/21   Susy Frizzle, MD  hydrochlorothiazide (HYDRODIURIL) 25 MG tablet Take 1 tablet (25 mg total) by mouth daily. 09/10/21   Susy Frizzle, MD  ibuprofen (ADVIL) 800 MG tablet TAKE 1 TABLET BY MOUTH EVERY 6 HOURS AS NEEDED 02/13/21   Evelina Bucy, DPM  ibuprofen (ADVIL) 800 MG tablet TAKE 1 TABLET BY MOUTH EVERY 6 HOURS AS NEEDED 08/05/21   Evelina Bucy, DPM  losartan (COZAAR) 100 MG tablet Take 1 tablet (100 mg total) by mouth daily. 08/30/21   Susy Frizzle, MD   oxyCODONE-acetaminophen (PERCOCET) 5-325 MG tablet Take 1-2 tablets by mouth every 4 (four) hours as needed for severe pain. 12/24/20   Felipa Furnace, DPM  oxyCODONE-acetaminophen (PERCOCET) 5-325 MG tablet Take 1-2 tablets by mouth every 4 (four) hours as needed for severe pain. 02/25/21   Felipa Furnace, DPM  PATADAY 0.7 % SOLN INSTILL 1 DROP INTO BOTH EYES EVERY DAY 10/15/21   Susy Frizzle, MD  phenazopyridine (PYRIDIUM) 200 MG tablet Take 1 tablet (200 mg total) by mouth 3 (three) times daily as needed for pain. 11/17/20   Melynda Ripple, MD  sulfamethoxazole-trimethoprim (BACTRIM DS) 800-160 MG tablet Take 1 tablet by mouth 2 (two) times daily. 06/03/21   Susy Frizzle, MD    Family History Family History  Problem Relation Age of Onset   Diabetes Mother    Cancer Mother    Stroke Father    Breast cancer Neg Hx     Social History Social History   Tobacco Use   Smoking status: Never   Smokeless tobacco: Never  Vaping Use   Vaping Use: Never used  Substance Use Topics   Alcohol use: No   Drug use: No     Allergies   Other and Hydroxyzine pamoate   Review of Systems Review of Systems Per HPI  Physical Exam Triage Vital Signs ED Triage Vitals  Enc Vitals Group     BP 11/09/21 1028 133/78     Pulse Rate 11/09/21 1028 65     Resp 11/09/21 1028 16     Temp 11/09/21 1028 98.7 F (37.1 C)     Temp Source 11/09/21 1028 Oral     SpO2 11/09/21 1028 100 %     Weight --      Height --      Head Circumference --      Peak Flow --      Pain Score 11/09/21 1026 0     Pain Loc --      Pain Edu? --      Excl. in [place]? --    No data found.  Updated Vital Signs BP 133/78 (BP Location: Left Arm)    Pulse 65    Temp 98.7 F (37.1 C) (Oral)    Resp 16    SpO2 100%   Visual Acuity Right Eye Distance:   Left Eye Distance:   Bilateral Distance:    Right Eye Near:   Left Eye Near:    Bilateral Near:     Physical Exam Vitals and nursing note reviewed.   Constitutional:      General: She is not in acute distress.    Appearance: Normal appearance. She is not toxic-appearing.  Pulmonary:  Effort: Pulmonary effort is normal. No respiratory distress.  Abdominal:     General: Abdomen is flat. There is no distension.     Palpations: Abdomen is soft.     Tenderness: There is abdominal tenderness. There is no right CVA tenderness, left CVA tenderness or guarding.    Skin:    General: Skin is warm and dry.     Capillary Refill: Capillary refill takes less than 2 seconds.     Coloration: Skin is not jaundiced or pale.     Findings: No erythema.  Neurological:     Mental Status: She is alert and oriented to person, place, and time.     Motor: No weakness.     Gait: Gait normal.  Psychiatric:        Mood and Affect: Mood normal.        Behavior: Behavior normal.     UC Treatments / Results  Labs (all labs ordered are listed, but only abnormal results are displayed) Labs Reviewed  POCT URINALYSIS DIPSTICK, ED / UC - Abnormal; Notable for the following components:      Result Value   Hgb urine dipstick LARGE (*)    Protein, ur 100 (*)    All other components within normal limits  URINE CULTURE    EKG   Radiology No results found.  Procedures Procedures (including critical care time)  Medications Ordered in UC Medications - No data to display  Initial Impression / Assessment and Plan / UC Course  I have reviewed the triage vital signs and the nursing notes.  Pertinent labs & imaging results that were available during my care of the patient were reviewed by me and considered in my medical decision making (see chart for details).    Urinalysis today shows hematuria, positive for protein.  Suspect infectious cause given symptoms.  Treat with cephalexin 500 mg 4 times daily for 5 days.  Discussed ER precautions-if she starts vomiting and is unable to keep fluids down, or develop severe back pain, she should go to the  emergency room.  Encouraged increasing intake of water.  Follow-up with PCP if symptoms do not improve with treatment. Final Clinical Impressions(s) / UC Diagnoses   Final diagnoses:  Dysuria  Gross hematuria     Discharge Instructions      Your urine test today showed blood and protein.  We will start antibiotics-Keflex.  Please take the entire course.  If your symptoms persist despite treatment, please make an appointment with your primary care provider.  If your symptoms worsen, please go to the emergency room.     ED Prescriptions     Medication Sig Dispense Auth. Provider   cephALEXin (KEFLEX) 500 MG capsule Take 1 capsule (500 mg total) by mouth every 6 (six) hours for 5 days. 20 capsule Eulogio Bear, NP      PDMP not reviewed this encounter.   Eulogio Bear, NP 11/09/21 1043

## 2021-11-09 NOTE — ED Triage Notes (Signed)
Pt presents to the office for Dysuria x 2 days. She thinks she has an UTI.

## 2021-11-09 NOTE — Discharge Instructions (Signed)
Your urine test today showed blood and protein.  We will start antibiotics-Keflex.  Please take the entire course.  If your symptoms persist despite treatment, please make an appointment with your primary care provider.  If your symptoms worsen, please go to the emergency room.

## 2021-11-12 ENCOUNTER — Telehealth (HOSPITAL_COMMUNITY): Payer: Self-pay | Admitting: Emergency Medicine

## 2021-11-12 LAB — URINE CULTURE: Culture: >=100000 — AB

## 2021-11-12 MED ORDER — SULFAMETHOXAZOLE-TRIMETHOPRIM 800-160 MG PO TABS: 1.0000 | ORAL_TABLET | Freq: Two times a day (BID) | ORAL | 0 refills | Status: AC

## 2021-11-14 ENCOUNTER — Ambulatory Visit
Admission: RE | Admit: 2021-11-14 | Discharge: 2021-11-14 | Disposition: A | Discharge status: Home / Self Care | Payer: Medicaid Other | Source: Ambulatory Visit | Attending: Family Medicine | Admitting: Family Medicine

## 2021-11-14 ENCOUNTER — Other Ambulatory Visit: Payer: Self-pay

## 2021-11-14 DIAGNOSIS — M810 Age-related osteoporosis without current pathological fracture: Secondary | ICD-10-CM | POA: Diagnosis not present

## 2021-11-14 DIAGNOSIS — Z780 Asymptomatic menopausal state: Secondary | ICD-10-CM | POA: Diagnosis not present

## 2021-11-27 ENCOUNTER — Other Ambulatory Visit: Payer: Self-pay | Admitting: Family Medicine

## 2021-11-28 ENCOUNTER — Other Ambulatory Visit: Payer: Self-pay

## 2021-11-28 ENCOUNTER — Encounter: Payer: Self-pay | Admitting: Family Medicine

## 2021-11-28 ENCOUNTER — Ambulatory Visit (INDEPENDENT_AMBULATORY_CARE_PROVIDER_SITE_OTHER): Payer: Medicaid Other | Admitting: Family Medicine

## 2021-11-28 VITALS — BP 122/68 | HR 73 | Temp 97.2°F | Resp 18 | Ht 62.0 in | Wt 120.0 lb

## 2021-11-28 DIAGNOSIS — M810 Age-related osteoporosis without current pathological fracture: Secondary | ICD-10-CM | POA: Diagnosis not present

## 2021-11-28 NOTE — Progress Notes (Signed)
? ?Subjective:  ? ? Patient ID: Heather Gillespie, female    DOB: [birth year], 61 y.o.   MRN: [id number] ? ?HPI ?  ?ASSESSMENT: ?The BMD measured at Femur Total Right is 0.586 g/cm2 with a T-score ?of -3.4. This patient is considered osteoporotic according to World ?Health Organization [facility]) criteria. ?  ?The quality of the exam is good. The lumbar spine was excluded due ?to degenerative and surgical changes. ?  ?Site Region Measured Date Measured Age YA BMD Significant CHANGE ?T-score ?DualFemur Total Right 11/14/2021 61.4 -3.4 0.586 g/cm2 * ?DualFemur Total Right 08/02/2019 59.1 -2.7 0.663 g/cm2 ?  ?DualFemur Total Mean 11/14/2021 61.4 -3.2 0.598 g/cm2 * ?DualFemur Total Mean 08/02/2019 59.1 -2.8 0.657 g/cm2 ?This noted above, there has been a significant decline in her bone mineral density over the last 3 years despite taking Fosamax.  The patient is here today to discuss treatment options.  Option #1 we will be switching to an IV bisphosphonate due to potential for disruption, option #2 would be switching to Prolia, and option #3 would be switching to a 2-year treatment course an anabolic agent such as Forteo. ?Past Medical History:  ?Diagnosis Date  ? Abdominal pain   ? Allergy   ? GERD (gastroesophageal reflux disease)   ? Glaucoma   ? Hemorrhoids   ? Hiatal hernia   ? IBS (irritable bowel syndrome)   ? N&V (nausea and vomiting)   ? Osteoporosis   ? Rectal bleeding   ? Rectal ulcer   ? Sinus problem   ? TMJ (dislocation of temporomandibular joint)   ? Vitamin D deficiency   ? ?Past Surgical History:  ?Procedure Laterality Date  ? ANAL FISSURE REPAIR    ? BACK SURGERY    ? CHOLECYSTECTOMY  1984  ? COLONOSCOPY W/ POLYPECTOMY    ? colon ulcers  ? EYE SURGERY    ? HEMI-MICRODISCECTOMY LUMBAR LAMINECTOMY LEVEL 1 Left 05/26/2013  ? Procedure: HEMI-MICRODISCECTOMY LUMBAR LAMINECTOMY L4 - L5 ON THE LEFT LEVEL 1;  Surgeon: Tobi Bastos, MD;  Location: [facility];  Service: Orthopedics;  Laterality: Left;  ?  HEMORROIDECTOMY    ? ?Current Outpatient Medications on File Prior to Visit  ?Medication Sig Dispense Refill  ? alendronate (FOSAMAX) 70 MG tablet TAKE 1 TABLET BY MOUTH EVERY 7 DAYS. TAKE WITH A FULL GLASS OF WATER ON AN EMPTY STOMACH. 4 tablet 11  ? cromolyn (OPTICROM) 4 % ophthalmic solution INSTILL 1 DROP INTO BOTH EYES 4 TIMES A DAY 10 mL 12  ? DENTA 5000 PLUS 1.1 % CREA dental cream Take by mouth 2 (two) times daily.    ? dexlansoprazole (DEXILANT) 60 MG capsule Take 60 mg by mouth daily.    ? Ergocalciferol (VITAMIN D2) 2000 units TABS Take 1 tablet by mouth daily.    ? fexofenadine (ALLEGRA) 180 MG tablet Take 1 tablet (180 mg total) by mouth daily. 90 tablet 3  ? fluticasone (FLONASE) 50 MCG/ACT nasal spray SPRAY 2 SPRAYS INTO EACH NOSTRIL EVERY DAY 16 mL 11  ? gabapentin (NEURONTIN) 300 MG capsule 1 capsule TID 90 capsule 3  ? hydrochlorothiazide (HYDRODIURIL) 25 MG tablet Take 1 tablet (25 mg total) by mouth daily. 90 tablet 3  ? ibuprofen (ADVIL) 800 MG tablet TAKE 1 TABLET BY MOUTH EVERY 6 HOURS AS NEEDED 60 tablet 1  ? ibuprofen (ADVIL) 800 MG tablet TAKE 1 TABLET BY MOUTH EVERY 6 HOURS AS NEEDED 60 tablet 1  ? losartan (COZAAR) 100 MG tablet Take 1  tablet (100 mg total) by mouth daily. 90 tablet 3  ? oxyCODONE-acetaminophen (PERCOCET) 5-325 MG tablet Take 1-2 tablets by mouth every 4 (four) hours as needed for severe pain. 30 tablet 0  ? oxyCODONE-acetaminophen (PERCOCET) 5-325 MG tablet Take 1-2 tablets by mouth every 4 (four) hours as needed for severe pain. 30 tablet 0  ? PATADAY 0.7 % SOLN INSTILL 1 DROP INTO BOTH EYES EVERY DAY 2.5 mL 3  ? phenazopyridine (PYRIDIUM) 200 MG tablet Take 1 tablet (200 mg total) by mouth 3 (three) times daily as needed for pain. 6 tablet 0  ? ?No current facility-administered medications on file prior to visit.  ? ?Allergies  ?Allergen Reactions  ? Hydroxyzine   ?  Other reaction(s): Unknown  ? Lotronex [Alosetron Hcl]   ?  Other reaction(s): Unknown  ? Other Other  (See Comments)  ? Hydroxyzine Pamoate Rash  ? ?Social History  ? ?Socioeconomic History  ? Marital status: Single  ?  Spouse name: Not on file  ? Number of children: Not on file  ? Years of education: Not on file  ? Highest education level: Not on file  ?Occupational History  ? Not on file  ?Tobacco Use  ? Smoking status: Never  ? Smokeless tobacco: Never  ?Vaping Use  ? Vaping Use: Never used  ?Substance and Sexual Activity  ? Alcohol use: No  ? Drug use: No  ? Sexual activity: Not Currently  ?Other Topics Concern  ? Not on file  ?Social History Narrative  ? Not on file  ? ?Social Determinants of Health  ? ?Financial Resource Strain: Not on file  ?Food Insecurity: Not on file  ?Transportation Needs: Not on file  ?Physical Activity: Not on file  ?Stress: Not on file  ?Social Connections: Not on file  ?Intimate Partner Violence: Not on file  ? ? ? ?Review of Systems  ?All other systems reviewed and are negative. ? ?   ?Objective:  ? Physical Exam ?Vitals reviewed.  ?Constitutional:   ?   Appearance: Normal appearance. She is normal weight.  ?Cardiovascular:  ?   Rate and Rhythm: Normal rate and regular rhythm.  ?   Heart sounds: Normal heart sounds.  ?Pulmonary:  ?   Effort: Pulmonary effort is normal.  ?   Breath sounds: Normal breath sounds.  ?Abdominal:  ?   General: Bowel sounds are normal.  ?   Palpations: Abdomen is soft.  ?Neurological:  ?   Mental Status: She is alert.  ? ? ? ? ? ?   ?Assessment & Plan:  ? ?Osteoporosis, unspecified osteoporosis type, unspecified pathological fracture presence ?Patient's bone mineral density has declined more than 5% despite being on oral bisphosphonates over the last 3 years.  Therefore she is failing management with oral bisphosphonate, calcium, and vitamin D.  We discussed treatment options including IV bisphosphonate, Prolia, or anabolic agents such as Forteo.  I will check a PTH to rule out hyperparathyroidism, check a calcium level, and check a vitamin D level to rule  out vitamin D deficiency.  We discussed her 3 options and the patient would like to try Prolia so I will schedule that for her every 6 months here.  If that does not work over the next 2 years I would recommend seeing an endocrinologist and potentially switching to Forteo ?

## 2021-11-29 LAB — PTH, INTACT AND CALCIUM
Calcium: 10.6 mg/dL — ABNORMAL HIGH (ref 8.6–10.4)
PTH: 21 pg/mL (ref 16–77)

## 2021-11-29 LAB — VITAMIN D 25 HYDROXY (VIT D DEFICIENCY, FRACTURES): Vit D, 25-Hydroxy: 38 ng/mL (ref 30–100)

## 2021-12-04 ENCOUNTER — Telehealth: Payer: Self-pay | Admitting: Family Medicine

## 2021-12-04 NOTE — Telephone Encounter (Signed)
Spoke with pt, regarding Prolia injections.Told pt that we will first needed to get it approved by her insurance company. Once it is approve we will give her a call back.  ?

## 2021-12-04 NOTE — Telephone Encounter (Signed)
Patient called to follow up on last visit; stated provider wants her to come back for shot for her bones. Patient unsure of name of shot or exactly when she's supposed to come in; believes she's supposed to receive it in 3-4 weeks from now. Requesting call back.  ? ?Patient wants provider to know her family is experiencing a lot of deaths at this time; just lost her mother last week, another relative today, and a third one is dying in the hospital.   ? ?Please advise at [phone]. ?

## 2022-01-03 ENCOUNTER — Encounter: Payer: Self-pay | Admitting: Family Medicine

## 2022-03-12 ENCOUNTER — Other Ambulatory Visit: Payer: Self-pay | Admitting: Family Medicine

## 2022-03-12 NOTE — Telephone Encounter (Signed)
Requested Prescriptions  Pending Prescriptions Disp Refills  . cromolyn (OPTICROM) 4 % ophthalmic solution [Pharmacy Med Name: CROMOLYN 4% EYE DROPS] 10 mL 12    Sig: INSTILL 1 DROP INTO BOTH EYES 4 TIMES A DAY.     Ophthalmology:  Antiallergy Passed - 03/12/2022 10:05 AM      Passed - Valid encounter within last 12 months    Recent Outpatient Visits          3 months ago Osteoporosis, unspecified osteoporosis type, unspecified pathological fracture presence   [place] [facility], Cammie Mcgee, MD   8 months ago Gross hematuria   [place] [facility] Schaumann, Cammie Mcgee, MD   9 months ago General medical exam   [place] Susy Frizzle, MD   1 year ago General medical exam   [place] Susy Frizzle, MD   2 years ago General medical exam   [place] Susy Frizzle, MD      Future Appointments            In 2 months [place], Cammie Mcgee, MD [place]

## 2022-03-14 ENCOUNTER — Telehealth: Payer: Self-pay | Admitting: *Deleted

## 2022-03-28 ENCOUNTER — Ambulatory Visit (HOSPITAL_COMMUNITY)
Admission: EM | Admit: 2022-03-28 | Discharge: 2022-03-28 | Disposition: A | Discharge status: Home / Self Care | Payer: Medicaid Other | Attending: Physician Assistant | Admitting: Physician Assistant

## 2022-03-28 ENCOUNTER — Encounter (HOSPITAL_COMMUNITY): Payer: Self-pay

## 2022-03-28 ENCOUNTER — Ambulatory Visit (INDEPENDENT_AMBULATORY_CARE_PROVIDER_SITE_OTHER): Payer: Medicaid Other

## 2022-03-28 DIAGNOSIS — M79672 Pain in left foot: Secondary | ICD-10-CM

## 2022-03-28 MED ORDER — IBUPROFEN 800 MG PO TABS: 800.0000 mg | ORAL_TABLET | Freq: Three times a day (TID) | ORAL | 0 refills | Status: DC

## 2022-03-28 NOTE — Discharge Instructions (Addendum)
I believe your pain is related to previous surgery.  Your x-ray did not show any evidence of a fracture or dislocation which is great news.  Use postop shoe for pain.  Keep it elevated and use ice.  Is important follow-up with podiatry soon as possible.  Use ibuprofen for pain.  Do not take additional NSAIDs including aspirin, ibuprofen/Advil, naproxen/Aleve.  If anything worsens please return for reevaluation.

## 2022-03-28 NOTE — ED Provider Notes (Signed)
[place]    CSN: [id number] Arrival date & time: 03/28/22  1346      History   Chief Complaint Chief Complaint  Patient presents with   Foot Pain    HPI Heather Gillespie is a 61 y.o. female.   Patient presents today with a several month history of worsening recurrent left foot pain.  Reports that she has a history of left foot pain and underwent surgery approximately 1 year ago that provided temporary relief of symptoms.  Reports that symptoms began to recur a few months ago and have worsened in the past several weeks.  She did contact her podiatrist but is unable to see them for several weeks prompting evaluation today.  Pain is rated 10 on a 0-10 pain scale, localized to fifth metatarsal, described as throbbing with periodic popping or sharp pains, no alleviating factors identified.  Denies any recent changes to footwear.  She has not tried any over-the-counter medications or conservative treatment measures.  Denies any numbness or paresthesias.    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 05/26/2013   Vitamin D deficiency    TMJ (dislocation of temporomandibular joint)    Osteoporosis    Rectal pain 03/31/2011   ANEMIA, IRON DEFICIENCY NOS 02/19/2007   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   HEMORRHOIDS NOS, W/O COMPLICATIONS 29/51/8841   RHINITIS, ALLERGIC NOS 02/19/2007   IBS 02/19/2007   DIARRHEA, CHRONIC 02/19/2007   UTI  (urinary tract infection) 02/19/2007    Past Surgical History:  Procedure Laterality Date   ANAL FISSURE REPAIR     BACK SURGERY     CHOLECYSTECTOMY  1984   COLONOSCOPY W/ POLYPECTOMY     colon ulcers   EYE SURGERY     HEMI-MICRODISCECTOMY LUMBAR LAMINECTOMY LEVEL 1 Left 05/26/2013   Procedure: HEMI-MICRODISCECTOMY LUMBAR LAMINECTOMY L4 - L5 ON THE LEFT LEVEL 1;  Surgeon: Tobi Bastos, MD;  Location: [facility];  Service: Orthopedics;  Laterality: Left;   HEMORROIDECTOMY      OB History   No obstetric history on file.      Home Medications    Prior to Admission medications   Medication Sig Start Date End Date Taking? Authorizing Provider  ibuprofen (ADVIL) 800 MG tablet Take 1 tablet (800 mg total) by mouth 3 (three) times daily. 03/28/22  Yes Ramina Hulet K, PA-C  alendronate (FOSAMAX) 70 MG tablet TAKE 1 TABLET BY MOUTH EVERY 7 DAYS. TAKE WITH A FULL GLASS OF WATER ON AN EMPTY STOMACH. 11/06/21   Susy Frizzle, MD  cromolyn (OPTICROM) 4 % ophthalmic solution INSTILL 1 DROP INTO BOTH EYES 4 TIMES A DAY. 03/12/22   Susy Frizzle, MD  DENTA 5000 PLUS 1.1 % CREA dental cream Take by mouth 2 (two) times daily. 05/15/20   [provider]  dexlansoprazole (DEXILANT) 60 MG capsule Take  60 mg by mouth daily.    [provider]  Ergocalciferol (VITAMIN D2) 2000 units TABS Take 1 tablet by mouth daily.    [provider]  fexofenadine (ALLEGRA) 180 MG tablet Take 1 tablet (180 mg total) by mouth daily. 10/04/21   Susy Frizzle, MD  fluticasone [facility]) 50 MCG/ACT nasal spray SPRAY 2 SPRAYS INTO EACH NOSTRIL EVERY DAY 11/28/21   Susy Frizzle, MD  gabapentin (NEURONTIN) 300 MG capsule 1 capsule TID 08/13/21   Susy Frizzle, MD  hydrochlorothiazide (HYDRODIURIL) 25 MG tablet Take 1 tablet (25 mg total) by mouth daily. 09/10/21   Susy Frizzle, MD  losartan (COZAAR) 100 MG tablet Take 1 tablet (100 mg total) by mouth daily. 08/30/21   Susy Frizzle, MD  oxyCODONE-acetaminophen (PERCOCET) 5-325 MG tablet Take 1-2 tablets by mouth every 4 (four) hours as needed for severe pain. 12/24/20   Felipa Furnace, DPM  oxyCODONE-acetaminophen (PERCOCET) 5-325 MG tablet Take 1-2 tablets by mouth every 4 (four) hours as needed for severe pain. 02/25/21   Felipa Furnace, DPM  PATADAY 0.7 % SOLN INSTILL 1 DROP INTO BOTH EYES EVERY DAY 10/15/21   Susy Frizzle, MD  phenazopyridine (PYRIDIUM) 200 MG tablet Take 1 tablet (200 mg total) by mouth 3 (three) times daily as needed for pain. 11/17/20   Melynda Ripple, MD    Family History Family History  Problem Relation Age of Onset   Diabetes Mother    Cancer Mother    Stroke Father    Breast cancer Neg Hx     Social History Social History   Tobacco Use   Smoking status: Never   Smokeless tobacco: Never  Vaping Use   Vaping Use: Never used  Substance Use Topics   Alcohol use: No   Drug use: No     Allergies   Hydroxyzine, Lotronex [alosetron hcl], Other, and Hydroxyzine pamoate   Review of Systems Review of Systems  Constitutional:  Positive for activity change. Negative for appetite change, fatigue and fever.  Musculoskeletal:  Positive for arthralgias, gait problem and myalgias. Negative for joint swelling.  Skin:  Negative for color change and wound.  Neurological:  Negative for dizziness, weakness, light-headedness, numbness and headaches.     Physical Exam Triage Vital Signs ED Triage Vitals  Enc Vitals Group     BP 03/28/22 1426 107/62     Pulse Rate 03/28/22 1426 75     Resp 03/28/22 1426 18     Temp 03/28/22 1426 98 F (36.7 C)     Temp Source 03/28/22 1426 Oral     SpO2 03/28/22 1426 99 %     Weight --      Height --      Head Circumference --      Peak Flow --      Pain Score 03/28/22 1428 10     Pain Loc --      Pain Edu? --      Excl. in [place]? --    No data found.  Updated Vital Signs BP 107/62 (BP Location: Right Arm)   Pulse 75   Temp 98 F  (36.7 C) (Oral)   Resp 18   SpO2 99%   Visual Acuity Right Eye Distance:   Left Eye Distance:   Bilateral Distance:    Right Eye Near:   Left Eye Near:    Bilateral Near:     Physical Exam Vitals reviewed.  Constitutional:  General: She is awake. She is not in acute distress.    Appearance: Normal appearance. She is well-developed. She is not ill-appearing.     Comments: Very pleasant female appears stated age in no acute distress sitting comfortably in exam room  HENT:     Head: Normocephalic and atraumatic.  Cardiovascular:     Rate and Rhythm: Normal rate and regular rhythm.     Pulses:          Posterior tibial pulses are 2+ on the right side and 2+ on the left side.     Heart sounds: Normal heart sounds, S1 normal and S2 normal. No murmur heard.    Comments: Capillary refill within 2 seconds left toes Pulmonary:     Effort: Pulmonary effort is normal.     Breath sounds: Normal breath sounds. No wheezing, rhonchi or rales.  Abdominal:     Palpations: Abdomen is soft.     Tenderness: There is no abdominal tenderness.  Musculoskeletal:     Left foot: Normal range of motion. No deformity or bunion.  Feet:     Left foot:     Protective Sensation: 10 sites tested.  10 sites sensed.     Skin integrity: No ulcer, blister or skin breakdown.     Toenail Condition: Left toenails are normal.     Comments: ToLeft foot: Palpation along fifth metatarsal without deformity.  Well-healed previous surgical scar noted.  Foot neurovascularly intact. Psychiatric:        Behavior: Behavior is cooperative.      UC Treatments / Results  Labs (all labs ordered are listed, but only abnormal results are displayed) Labs Reviewed - No data to display  EKG   Radiology DG Foot Complete Left  Result Date: 03/28/2022 CLINICAL DATA:  Pain EXAM: LEFT FOOT - COMPLETE 3+ VIEW COMPARISON:  01/02/2021 FINDINGS: Old healed 5th metatarsal fracture with deformity. No acute fracture,  subluxation or dislocation. Joint spaces maintained. Soft tissues are intact. IMPRESSION: No acute bony abnormality. Electronically Signed   By: Rolm Baptise M.D.   On: 03/28/2022 15:16    Procedures Procedures (including critical care time)  Medications Ordered in UC Medications - No data to display  Initial Impression / Assessment and Plan / UC Course  I have reviewed the triage vital signs and the nursing notes.  Pertinent labs & imaging results that were available during my care of the patient were reviewed by me and considered in my medical decision making (see chart for details).     X-ray obtained given history of previous surgical procedure which showed no acute osseous abnormalities.  Discussed that symptoms are likely related to chronic degeneration related to surgical procedure.  Recommend follow-up with podiatry.  Encouraged her to use conservative treatment measures including RICE protocol.  She was placed in postop shoe for comfort and support.  Prescription for ibuprofen sent to pharmacy.  Discussed that she is not to take NSAIDs with this medication due to risk of GI bleeding.  She is to avoid strenuous activity including prolonged ambulation.  Discussed that if symptoms worsen in any way and she has numbness, tingling, redness, deformity, lesion she needs to be seen immediately.  Strict return precautions given.  Work excuse note provided.  Final Clinical Impressions(s) / UC Diagnoses   Final diagnoses:  Left foot pain     Discharge Instructions      I believe your pain is related to previous surgery.  Your x-ray did not show any  evidence of a fracture or dislocation which is great news.  Use postop shoe for pain.  Keep it elevated and use ice.  Is important follow-up with podiatry soon as possible.  Use ibuprofen for pain.  Do not take additional NSAIDs including aspirin, ibuprofen/Advil, naproxen/Aleve.  If anything worsens please return for reevaluation.     ED  Prescriptions     Medication Sig Dispense Auth. Provider   ibuprofen (ADVIL) 800 MG tablet Take 1 tablet (800 mg total) by mouth 3 (three) times daily. 21 tablet Wesly Whisenant K, PA-C      I have reviewed the PDMP during this encounter.   Terrilee Croak, PA-C 03/28/22 1548

## 2022-03-28 NOTE — ED Triage Notes (Signed)
Pt c/o chronic foot pain. States feels like she is walking on bone to lt foot and you can feel the bone on the top of foot. States had surgery a year ago to that foot. States unable to see her foot doctor in 2 wks. Denies injury.

## 2022-04-02 ENCOUNTER — Ambulatory Visit (HOSPITAL_COMMUNITY)
Admission: EM | Admit: 2022-04-02 | Discharge: 2022-04-02 | Disposition: A | Discharge status: Home / Self Care | Payer: Medicaid Other | Attending: Physician Assistant | Admitting: Physician Assistant

## 2022-04-02 ENCOUNTER — Encounter (HOSPITAL_COMMUNITY): Payer: Self-pay | Admitting: Emergency Medicine

## 2022-04-02 DIAGNOSIS — N3001 Acute cystitis with hematuria: Secondary | ICD-10-CM | POA: Diagnosis not present

## 2022-04-02 LAB — CBC WITH DIFFERENTIAL/PLATELET
Abs Immature Granulocytes: 0.01 10*3/uL (ref 0.00–0.07)
Basophils Absolute: 0 10*3/uL (ref 0.0–0.1)
Basophils Relative: 1 %
Eosinophils Absolute: 0.1 10*3/uL (ref 0.0–0.5)
Eosinophils Relative: 2 %
HCT: 37.1 % (ref 36.0–46.0)
Hemoglobin: 12.7 g/dL (ref 12.0–15.0)
Immature Granulocytes: 0 %
Lymphocytes Relative: 34 %
Lymphs Abs: 1.5 10*3/uL (ref 0.7–4.0)
MCH: 30.6 pg (ref 26.0–34.0)
MCHC: 34.2 g/dL (ref 30.0–36.0)
MCV: 89.4 fL (ref 80.0–100.0)
Monocytes Absolute: 0.3 10*3/uL (ref 0.1–1.0)
Monocytes Relative: 7 %
Neutro Abs: 2.5 10*3/uL (ref 1.7–7.7)
Neutrophils Relative %: 56 %
Platelets: 215 10*3/uL (ref 150–400)
RBC: 4.15 MIL/uL (ref 3.87–5.11)
RDW: 12.5 % (ref 11.5–15.5)
WBC: 4.4 10*3/uL (ref 4.0–10.5)
nRBC: 0 % (ref 0.0–0.2)

## 2022-04-02 LAB — BASIC METABOLIC PANEL
Anion gap: 14 (ref 5–15)
BUN: 19 mg/dL (ref 8–23)
CO2: 27 mmol/L (ref 22–32)
Calcium: 9.9 mg/dL (ref 8.9–10.3)
Chloride: 100 mmol/L (ref 98–111)
Creatinine, Ser: 1.32 mg/dL — ABNORMAL HIGH (ref 0.44–1.00)
GFR, Estimated: 46 mL/min — ABNORMAL LOW (ref >60)
Glucose, Bld: 74 mg/dL (ref 70–99)
Potassium: 4.2 mmol/L (ref 3.5–5.1)
Sodium: 141 mmol/L (ref 135–145)

## 2022-04-02 LAB — POCT URINALYSIS DIPSTICK, ED / UC
Glucose, UA: NEGATIVE mg/dL
Leukocytes,Ua: NEGATIVE
Nitrite: POSITIVE — AB
Protein, ur: >=300 mg/dL — AB
Specific Gravity, Urine: 1.025 (ref 1.005–1.030)
Urobilinogen, UA: 1 mg/dL (ref 0.0–1.0)
pH: 5.5 (ref 5.0–8.0)

## 2022-04-02 MED ORDER — SULFAMETHOXAZOLE-TRIMETHOPRIM 800-160 MG PO TABS: 1.0000 | ORAL_TABLET | Freq: Two times a day (BID) | ORAL | 0 refills | Status: AC

## 2022-04-02 MED ORDER — LIDOCAINE HCL (PF) 1 % IJ SOLN
INTRAMUSCULAR | Status: AC
  Filled 2022-04-02: qty 2

## 2022-04-02 MED ORDER — CEFTRIAXONE SODIUM 1 G IJ SOLR
INTRAMUSCULAR | Status: AC
  Filled 2022-04-02: qty 10

## 2022-04-02 MED ORDER — CEFTRIAXONE SODIUM 1 G IJ SOLR
1.0000 g | Freq: Once | INTRAMUSCULAR | Status: AC
  Administered 2022-04-02: 1 g via INTRAMUSCULAR

## 2022-04-02 MED ORDER — FLUCONAZOLE 150 MG PO TABS: 150.0000 mg | ORAL_TABLET | Freq: Once | ORAL | 0 refills | Status: AC

## 2022-04-02 NOTE — ED Triage Notes (Signed)
Pt c/o dysuria and hematuria for 4-5 days. Tried drinking a lot.

## 2022-04-02 NOTE — Discharge Instructions (Addendum)
I am concerned that you have a significant urinary tract infection.  I have given you injection of antibiotics today.  Please start Bactrim twice daily.  If you develop any rash or oral lesions stop the medication and be seen immediately.  I will contact you with your lab work if anything is abnormal.  Follow-up with urology; call to schedule appointment.  If anything worsens you need to go to the emergency room.

## 2022-04-02 NOTE — ED Provider Notes (Addendum)
[place]    CSN: [id number] Arrival date & time: 04/02/22  1259      History   Chief Complaint Chief Complaint  Patient presents with   Dysuria   Hematuria    HPI Heather Gillespie is a 61 y.o. female.   Patient presents today with a 4 to 5-day history of UTI symptoms.  Reports dysuria, lower abdominal pain, left flank pain, hematuria, frequency, urgency.  Denies any nausea, vomiting, fever.  Denies any vaginal symptoms.  She has not tried any over-the-counter medication for symptom management.  She has tried pushing fluids without improvement of symptoms.  Reports a history of recurrent UTI with similar presentation.  She is not currently followed by urology.  Denies history of nephrolithiasis.  Denies any recent urogenital procedure.  Denies history of diabetes and does not take SGLT2 inhibitor.  She is having difficulty with daily activities as result of symptoms.    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 05/26/2013   Vitamin D deficiency    TMJ (dislocation of temporomandibular joint)    Osteoporosis    Rectal pain 03/31/2011   ANEMIA, IRON DEFICIENCY NOS 02/19/2007   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   HEMORRHOIDS NOS, W/O COMPLICATIONS 94/85/4627   RHINITIS, ALLERGIC NOS 02/19/2007   IBS 02/19/2007   DIARRHEA, CHRONIC 02/19/2007   UTI (urinary tract infection) 02/19/2007    Past Surgical History:  Procedure  Laterality Date   ANAL FISSURE REPAIR     BACK SURGERY     CHOLECYSTECTOMY  1984   COLONOSCOPY W/ POLYPECTOMY     colon ulcers   EYE SURGERY     HEMI-MICRODISCECTOMY LUMBAR LAMINECTOMY LEVEL 1 Left 05/26/2013   Procedure: HEMI-MICRODISCECTOMY LUMBAR LAMINECTOMY L4 - L5 ON THE LEFT LEVEL 1;  Surgeon: Tobi Bastos, MD;  Location: [facility];  Service: Orthopedics;  Laterality: Left;   HEMORROIDECTOMY      OB History   No obstetric history on file.      Home Medications    Prior to Admission medications   Medication Sig Start Date End Date Taking? Authorizing Provider  sulfamethoxazole-trimethoprim (BACTRIM DS) 800-160 MG tablet Take 1 tablet by mouth 2 (two) times daily for 7 days. 04/02/22 04/09/22 Yes Janesa Dockery K, PA-C  alendronate (FOSAMAX) 70 MG tablet TAKE 1 TABLET BY MOUTH EVERY 7 DAYS. TAKE WITH A FULL GLASS OF WATER ON AN EMPTY STOMACH. 11/06/21   Susy Frizzle, MD  cromolyn (OPTICROM) 4 % ophthalmic solution INSTILL 1 DROP INTO BOTH EYES 4 TIMES A DAY. 03/12/22   Susy Frizzle, MD  DENTA 5000 PLUS 1.1 % CREA dental cream Take by mouth 2 (two) times daily. 05/15/20   [provider]  dexlansoprazole (DEXILANT) 60 MG capsule Take 60 mg by mouth daily.    [provider]  Ergocalciferol (VITAMIN D2) 2000  units TABS Take 1 tablet by mouth daily.    [provider]  fexofenadine (ALLEGRA) 180 MG tablet Take 1 tablet (180 mg total) by mouth daily. 10/04/21   Susy Frizzle, MD  fluticasone [facility]) 50 MCG/ACT nasal spray SPRAY 2 SPRAYS INTO EACH NOSTRIL EVERY DAY 11/28/21   Susy Frizzle, MD  gabapentin (NEURONTIN) 300 MG capsule 1 capsule TID 08/13/21   Susy Frizzle, MD  hydrochlorothiazide (HYDRODIURIL) 25 MG tablet Take 1 tablet (25 mg total) by mouth daily. 09/10/21   Susy Frizzle, MD  ibuprofen (ADVIL) 800 MG tablet Take 1 tablet (800 mg total) by mouth 3 (three) times daily. 03/28/22   Kaytelynn Scripter, Derry Skill, PA-C  losartan (COZAAR) 100  MG tablet Take 1 tablet (100 mg total) by mouth daily. 08/30/21   Susy Frizzle, MD  oxyCODONE-acetaminophen (PERCOCET) 5-325 MG tablet Take 1-2 tablets by mouth every 4 (four) hours as needed for severe pain. 12/24/20   Felipa Furnace, DPM  oxyCODONE-acetaminophen (PERCOCET) 5-325 MG tablet Take 1-2 tablets by mouth every 4 (four) hours as needed for severe pain. 02/25/21   Felipa Furnace, DPM  PATADAY 0.7 % SOLN INSTILL 1 DROP INTO BOTH EYES EVERY DAY 10/15/21   Susy Frizzle, MD  phenazopyridine (PYRIDIUM) 200 MG tablet Take 1 tablet (200 mg total) by mouth 3 (three) times daily as needed for pain. 11/17/20   Melynda Ripple, MD    Family History Family History  Problem Relation Age of Onset   Diabetes Mother    Cancer Mother    Stroke Father    Breast cancer Neg Hx     Social History Social History   Tobacco Use   Smoking status: Never   Smokeless tobacco: Never  Vaping Use   Vaping Use: Never used  Substance Use Topics   Alcohol use: No   Drug use: No     Allergies   Hydroxyzine, Lotronex [alosetron hcl], Other, and Hydroxyzine pamoate   Review of Systems Review of Systems  Constitutional:  Positive for activity change. Negative for appetite change, fatigue and fever.  Respiratory:  Negative for cough and shortness of breath.   Cardiovascular:  Negative for chest pain.  Gastrointestinal:  Positive for abdominal pain. Negative for diarrhea, nausea and vomiting.  Genitourinary:  Positive for dysuria, flank pain, frequency and urgency. Negative for pelvic pain, vaginal bleeding, vaginal discharge and vaginal pain.     Physical Exam Triage Vital Signs ED Triage Vitals  Enc Vitals Group     BP 04/02/22 1329 111/66     Pulse Rate 04/02/22 1329 71     Resp 04/02/22 1329 18     Temp 04/02/22 1329 97.9 F (36.6 C)     Temp Source 04/02/22 1329 Oral     SpO2 04/02/22 1329 97 %     Weight --      Height --      Head Circumference --      Peak Flow --       Pain Score 04/02/22 1328 10     Pain Loc --      Pain Edu? --      Excl. in [place]? --    No data found.  Updated Vital Signs BP 111/66 (BP Location: Right Arm)   Pulse 71   Temp 97.9 F (36.6 C) (Oral)   Resp 18   SpO2 97%   Visual Acuity Right Eye Distance:   Left Eye Distance:   Bilateral  Distance:    Right Eye Near:   Left Eye Near:    Bilateral Near:     Physical Exam Vitals reviewed.  Constitutional:      General: She is awake. She is not in acute distress.    Appearance: Normal appearance. She is well-developed. She is not ill-appearing.     Comments: Very pleasant female appears stated age in no acute distress sitting comfortably in exam room  HENT:     Head: Normocephalic and atraumatic.  Cardiovascular:     Rate and Rhythm: Normal rate and regular rhythm.     Heart sounds: Normal heart sounds, S1 normal and S2 normal. No murmur heard. Pulmonary:     Effort: Pulmonary effort is normal.     Breath sounds: Normal breath sounds. No wheezing, rhonchi or rales.     Comments: Clear to auscultation bilaterally Abdominal:     General: Bowel sounds are normal.     Palpations: Abdomen is soft.     Tenderness: There is abdominal tenderness in the suprapubic area. There is left CVA tenderness. There is no right CVA tenderness, guarding or rebound.  Psychiatric:        Behavior: Behavior is cooperative.      UC Treatments / Results  Labs (all labs ordered are listed, but only abnormal results are displayed) Labs Reviewed  POCT URINALYSIS DIPSTICK, ED / UC - Abnormal; Notable for the following components:      Result Value   Bilirubin Urine SMALL (*)    Ketones, ur TRACE (*)    Hgb urine dipstick LARGE (*)    Protein, ur >=300 (*)    Nitrite POSITIVE (*)    All other components within normal limits  URINE CULTURE  CBC WITH DIFFERENTIAL/PLATELET  BASIC METABOLIC PANEL    EKG   Radiology No results found.  Procedures Procedures (including critical care  time)  Medications Ordered in UC Medications  cefTRIAXone (ROCEPHIN) injection 1 g (has no administration in time range)    Initial Impression / Assessment and Plan / UC Course  I have reviewed the triage vital signs and the nursing notes.  Pertinent labs & imaging results that were available during my care of the patient were reviewed by me and considered in my medical decision making (see chart for details).     Patient is well-appearing, afebrile, nontoxic, nontachycardic.  Concern for beginning of pyelonephritis given CVA tenderness.  Patient was given Rocephin in clinic and started on Bactrim DS.  CBC and BMP obtained today.  If she has significant leukocytosis or abnormal kidney function she will need to be seen in the emergency room.  Recommend follow-up with urology and was given contact information for local provider with instruction to call to schedule an appointment.  She was encouraged to push fluids.  Discussed that if she has any worsening symptoms including abdominal pain, fever, nausea, vomiting she needs to go to the ER.  Strict return precautions given.  At the end of visit patient requested a Diflucan be sent in as she often gets yeast infections with antibiotics.  1 dose of Diflucan sent to pharmacy per her request.  Final Clinical Impressions(s) / UC Diagnoses   Final diagnoses:  Acute cystitis with hematuria     Discharge Instructions      I am concerned that you have a significant urinary tract infection.  I have given you injection of antibiotics today.  Please start Bactrim twice daily.  If you develop any rash or  oral lesions stop the medication and be seen immediately.  I will contact you with your lab work if anything is abnormal.  Follow-up with urology; call to schedule appointment.  If anything worsens you need to go to the emergency room.     ED Prescriptions     Medication Sig Dispense Auth. Provider   sulfamethoxazole-trimethoprim (BACTRIM DS)  800-160 MG tablet Take 1 tablet by mouth 2 (two) times daily for 7 days. 14 tablet Ryla Cauthon, Derry Skill, PA-C      PDMP not reviewed this encounter.   Terrilee Croak, PA-C 04/02/22 1353    Kameka Whan K, PA-C 04/02/22 1401

## 2022-04-03 LAB — URINE CULTURE

## 2022-04-07 ENCOUNTER — Other Ambulatory Visit: Payer: Self-pay | Admitting: Gastroenterology

## 2022-04-07 DIAGNOSIS — K625 Hemorrhage of anus and rectum: Secondary | ICD-10-CM | POA: Diagnosis not present

## 2022-04-07 DIAGNOSIS — R634 Abnormal weight loss: Secondary | ICD-10-CM | POA: Diagnosis not present

## 2022-04-07 DIAGNOSIS — R1013 Epigastric pain: Secondary | ICD-10-CM

## 2022-04-09 ENCOUNTER — Ambulatory Visit (HOSPITAL_COMMUNITY)
Admission: EM | Admit: 2022-04-09 | Discharge: 2022-04-09 | Disposition: A | Discharge status: Home / Self Care | Payer: Medicaid Other | Attending: Emergency Medicine | Admitting: Emergency Medicine

## 2022-04-09 ENCOUNTER — Encounter (HOSPITAL_COMMUNITY): Payer: Self-pay | Admitting: Emergency Medicine

## 2022-04-09 DIAGNOSIS — R21 Rash and other nonspecific skin eruption: Secondary | ICD-10-CM

## 2022-04-09 MED ORDER — VALACYCLOVIR HCL 1 G PO TABS: 1000.0000 mg | ORAL_TABLET | Freq: Three times a day (TID) | ORAL | 0 refills | Status: AC

## 2022-04-09 MED ORDER — DOXYCYCLINE HYCLATE 100 MG PO CAPS: 100.0000 mg | ORAL_CAPSULE | Freq: Two times a day (BID) | ORAL | 0 refills | Status: DC

## 2022-04-09 NOTE — ED Provider Notes (Signed)
[place]    CSN: [id number] Arrival date & time: 04/09/22  1029      History   Chief Complaint Chief Complaint  Patient presents with   Rash    HPI Heather Gillespie is a 61 y.o. female.   Patient presents with left lower leg swelling, erythematous rash, mild pruritus and pain beginning 1 day ago.  Endorses that the skin over the rash feels hot .associated ever peaking at 102.1.  Denies drainage.  Denies changes in soaps, lotions detergents, exposure to wooded or leafy areas.  Endorses that she was wearing a boot over the left lower extremity which she is unsure if related.   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 05/26/2013   Vitamin D deficiency    TMJ (dislocation of temporomandibular joint)    Osteoporosis    Rectal pain 03/31/2011   ANEMIA, IRON DEFICIENCY NOS 02/19/2007   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   HEMORRHOIDS NOS, W/O COMPLICATIONS 13/04/6577   RHINITIS, ALLERGIC NOS 02/19/2007   IBS 02/19/2007   DIARRHEA, CHRONIC 02/19/2007   UTI (urinary tract infection) 02/19/2007    Past Surgical History:  Procedure Laterality Date   ANAL FISSURE REPAIR     BACK SURGERY     CHOLECYSTECTOMY  1984   COLONOSCOPY W/ POLYPECTOMY     colon ulcers   EYE SURGERY     HEMI-MICRODISCECTOMY LUMBAR LAMINECTOMY LEVEL 1 Left 05/26/2013   Procedure: HEMI-MICRODISCECTOMY LUMBAR LAMINECTOMY L4 - L5 ON THE LEFT LEVEL 1;   Surgeon: Tobi Bastos, MD;  Location: [facility];  Service: Orthopedics;  Laterality: Left;   HEMORROIDECTOMY      OB History   No obstetric history on file.      Home Medications    Prior to Admission medications   Medication Sig Start Date End Date Taking? Authorizing Provider  doxycycline (VIBRAMYCIN) 100 MG capsule Take 1 capsule (100 mg total) by mouth 2 (two) times daily. 04/09/22  Yes Mayreli Alden, Leitha Schuller, NP  valACYclovir (VALTREX) 1000 MG tablet Take 1 tablet (1,000 mg total) by mouth 3 (three) times daily for 7 days. 04/09/22 04/16/22 Yes Zoee Heeney R, NP  alendronate (FOSAMAX) 70 MG tablet TAKE 1 TABLET BY MOUTH EVERY 7 DAYS. TAKE WITH A FULL GLASS OF WATER ON AN EMPTY STOMACH. 11/06/21   Susy Frizzle, MD  cromolyn (OPTICROM) 4 % ophthalmic solution INSTILL 1 DROP INTO BOTH EYES 4 TIMES A DAY. 03/12/22   Susy Frizzle, MD  DENTA 5000 PLUS 1.1 % CREA dental cream Take by mouth 2 (two) times daily. 05/15/20   [provider]  dexlansoprazole (DEXILANT) 60 MG capsule Take 60 mg by mouth daily.    [provider]  Ergocalciferol (VITAMIN D2) 2000 units TABS Take 1 tablet by mouth daily.    [provider]  fexofenadine (  ALLEGRA) 180 MG tablet Take 1 tablet (180 mg total) by mouth daily. 10/04/21   Susy Frizzle, MD  fluticasone [facility]) 50 MCG/ACT nasal spray SPRAY 2 SPRAYS INTO EACH NOSTRIL EVERY DAY 11/28/21   Susy Frizzle, MD  gabapentin (NEURONTIN) 300 MG capsule 1 capsule TID 08/13/21   Susy Frizzle, MD  hydrochlorothiazide (HYDRODIURIL) 25 MG tablet Take 1 tablet (25 mg total) by mouth daily. 09/10/21   Susy Frizzle, MD  ibuprofen (ADVIL) 800 MG tablet Take 1 tablet (800 mg total) by mouth 3 (three) times daily. 03/28/22   Raspet, Derry Skill, PA-C  losartan (COZAAR) 100 MG tablet Take 1 tablet (100 mg total) by mouth daily. 08/30/21   Susy Frizzle, MD  oxyCODONE-acetaminophen (PERCOCET) 5-325 MG tablet Take 1-2 tablets by mouth  every 4 (four) hours as needed for severe pain. 12/24/20   Felipa Furnace, DPM  oxyCODONE-acetaminophen (PERCOCET) 5-325 MG tablet Take 1-2 tablets by mouth every 4 (four) hours as needed for severe pain. 02/25/21   Felipa Furnace, DPM  PATADAY 0.7 % SOLN INSTILL 1 DROP INTO BOTH EYES EVERY DAY 10/15/21   Susy Frizzle, MD  phenazopyridine (PYRIDIUM) 200 MG tablet Take 1 tablet (200 mg total) by mouth 3 (three) times daily as needed for pain. 11/17/20   Melynda Ripple, MD  sulfamethoxazole-trimethoprim (BACTRIM DS) 800-160 MG tablet Take 1 tablet by mouth 2 (two) times daily for 7 days. 04/02/22 04/09/22  Raspet, Derry Skill, PA-C    Family History Family History  Problem Relation Age of Onset   Diabetes Mother    Cancer Mother    Stroke Father    Breast cancer Neg Hx     Social History Social History   Tobacco Use   Smoking status: Never   Smokeless tobacco: Never  Vaping Use   Vaping Use: Never used  Substance Use Topics   Alcohol use: No   Drug use: No     Allergies   Hydroxyzine, Lotronex [alosetron hcl], Other, and Hydroxyzine pamoate   Review of Systems Review of Systems  Constitutional: Negative.   Respiratory: Negative.    Cardiovascular: Negative.   Skin:  Positive for rash. Negative for color change, pallor and wound.  Neurological: Negative.      Physical Exam Triage Vital Signs ED Triage Vitals  Enc Vitals Group     BP 04/09/22 1041 123/64     Pulse Rate 04/09/22 1041 76     Resp 04/09/22 1041 18     Temp 04/09/22 1041 98 F (36.7 C)     Temp src --      SpO2 04/09/22 1041 98 %     Weight --      Height --      Head Circumference --      Peak Flow --      Pain Score 04/09/22 1040 10     Pain Loc --      Pain Edu? --      Excl. in [place]? --    No data found.  Updated Vital Signs BP 123/64   Pulse 76   Temp 98 F (36.7 C)   Resp 18   SpO2 98%   Visual Acuity Right Eye Distance:   Left Eye Distance:   Bilateral Distance:    Right Eye  Near:   Left Eye Near:    Bilateral Near:     Physical Exam Constitutional:      Appearance: Normal appearance.  HENT:     Head: Normocephalic.  Eyes:     Extraocular Movements: Extraocular movements intact.  Pulmonary:     Effort: Pulmonary effort is normal.  Skin:    Comments: Defer to photo   Neurological:     Mental Status: She is alert and oriented to person, place, and time. Mental status is at baseline.  Psychiatric:        Mood and Affect: Mood normal.        Behavior: Behavior normal.      UC Treatments / Results  Labs (all labs ordered are listed, but only abnormal results are displayed) Labs Reviewed - No data to display  EKG   Radiology No results found.  Procedures Procedures (including critical care time)  Medications Ordered in UC Medications - No data to display  Initial Impression / Assessment and Plan / UC Course  I have reviewed the triage vital signs and the nursing notes.  Pertinent labs & imaging results that were available during my care of the patient were reviewed by me and considered in my medical decision making (see chart for details).  Rash  Due to fevers I do believe that there is an infectious cause of bacteria along with associated swelling, erythema, doxycycline 7-day course prescribed, will provide antiviral coverage for shingles as well as there is slight concern is pain is described as a burning sensation, valacyclovir 7-day course prescribed in addition, may use ice, elevation and over-the-counter analgesics for additional supportive care, advised patient to leave boot off until some symptoms have begun to subside Final Clinical Impressions(s) / UC Diagnoses   Final diagnoses:  Cellulitis of left lower leg     Discharge Instructions      Due to your fevers I do believe that your symptoms are related to a bacterial infection of the soft tissues however we also will provide additional coverage for shingles which is a  virus  Take doxycycline every morning and every evening for the next 7 days  Take valacyclovir 3 times daily (every 8 hours) for the next 7 days  You may take Tylenol or ibuprofen every 6 hours as needed for management of fevers  Elevate your extremity while sitting and lying to help reduce swelling  May place ice over the affected area if you find it comfortable  Please follow-up with urgent care or with your primary doctor if your symptoms persist or worsen     ED Prescriptions     Medication Sig Dispense Auth. Provider   doxycycline (VIBRAMYCIN) 100 MG capsule Take 1 capsule (100 mg total) by mouth 2 (two) times daily. 14 capsule Tranise Forrest R, NP   valACYclovir (VALTREX) 1000 MG tablet Take 1 tablet (1,000 mg total) by mouth 3 (three) times daily for 7 days. 21 tablet Seif Teichert, Leitha Schuller, NP      PDMP not reviewed this encounter.   Hans Eden, NP 04/09/22 1113

## 2022-04-09 NOTE — Discharge Instructions (Addendum)
Due to your fevers I do believe that your symptoms are related to a bacterial infection of the soft tissues however we also will provide additional coverage for shingles which is a virus  Take doxycycline every morning and every evening for the next 7 days  Take valacyclovir 3 times daily (every 8 hours) for the next 7 days  You may take Tylenol or ibuprofen every 6 hours as needed for management of fevers  Elevate your extremity while sitting and lying to help reduce swelling  May place ice over the affected area if you find it comfortable  Please follow-up with urgent care or with your primary doctor if your symptoms persist or worsen

## 2022-04-09 NOTE — ED Triage Notes (Signed)
Pt is present today with a rash on the lower left leg. Pt states she noticed the rash yesterday Pt states that the rash it hot to the touch and burns.

## 2022-04-10 ENCOUNTER — Ambulatory Visit (INDEPENDENT_AMBULATORY_CARE_PROVIDER_SITE_OTHER): Payer: PRIVATE HEALTH INSURANCE | Admitting: Podiatry

## 2022-04-10 ENCOUNTER — Encounter: Payer: Self-pay | Admitting: Podiatry

## 2022-04-10 ENCOUNTER — Ambulatory Visit (INDEPENDENT_AMBULATORY_CARE_PROVIDER_SITE_OTHER): Payer: Medicaid Other

## 2022-04-10 DIAGNOSIS — M7672 Peroneal tendinitis, left leg: Secondary | ICD-10-CM

## 2022-04-10 DIAGNOSIS — Z9889 Other specified postprocedural states: Secondary | ICD-10-CM

## 2022-04-10 MED ORDER — TRIAMCINOLONE ACETONIDE 10 MG/ML IJ SUSP
10.0000 mg | Freq: Once | INTRAMUSCULAR | Status: AC
  Administered 2022-04-10: 10 mg

## 2022-04-10 NOTE — Progress Notes (Signed)
Subjective:   Patient ID: Heather Gillespie, female   DOB: 61 y.o.   MRN: [id number]   HPI Patient states that she is having pain in new area of her left foot with occasional numbness with long-term history of back issues with radiating leg pains.  States that this pain seems different more recently eft neurovascular status intact patient has had history of floating osteotomies with healing fifth metatarsal bilateral with pain that is now noted at the base of the fifth metatarsal left with inflammation fluid buildup   ROS      Objective:  Physical Exam  Above discussed with the discomfort that the patient is experiencing     Assessment:  Acute peroneal tendinitis left with inflammation     Plan:  H&P x-ray reviewed sterile prep done and I did inject the peroneal insertion left 3 mg dexamethasone Kenalog 5 mg Xylocaine after first explaining chances for rupture associated with injection.  Patient tolerated well will be seen back as needed  X-rays were negative for signs of injury around the base of the fifth metatarsal with previous healed fracture of the fifth metatarsal shaft secondary to floating osteotomy

## 2022-04-15 ENCOUNTER — Ambulatory Visit
Admission: RE | Admit: 2022-04-15 | Discharge: 2022-04-15 | Disposition: A | Discharge status: Home / Self Care | Payer: Medicaid Other | Source: Ambulatory Visit | Attending: Gastroenterology | Admitting: Gastroenterology

## 2022-04-15 DIAGNOSIS — R1013 Epigastric pain: Secondary | ICD-10-CM

## 2022-04-16 ENCOUNTER — Emergency Department (HOSPITAL_COMMUNITY)
Admission: EM | Admit: 2022-04-16 | Discharge: 2022-04-17 | Disposition: A | Discharge status: Home / Self Care | Payer: Medicaid Other | Attending: Emergency Medicine | Admitting: Emergency Medicine

## 2022-04-16 ENCOUNTER — Other Ambulatory Visit: Payer: Self-pay

## 2022-04-16 ENCOUNTER — Encounter (HOSPITAL_COMMUNITY): Payer: Self-pay

## 2022-04-16 ENCOUNTER — Emergency Department (HOSPITAL_COMMUNITY): Payer: Medicaid Other

## 2022-04-16 DIAGNOSIS — R310 Gross hematuria: Secondary | ICD-10-CM | POA: Diagnosis not present

## 2022-04-16 DIAGNOSIS — N83209 Unspecified ovarian cyst, unspecified side: Secondary | ICD-10-CM | POA: Diagnosis not present

## 2022-04-16 DIAGNOSIS — K625 Hemorrhage of anus and rectum: Secondary | ICD-10-CM | POA: Diagnosis not present

## 2022-04-16 DIAGNOSIS — N939 Abnormal uterine and vaginal bleeding, unspecified: Secondary | ICD-10-CM | POA: Diagnosis not present

## 2022-04-16 DIAGNOSIS — E876 Hypokalemia: Secondary | ICD-10-CM | POA: Insufficient documentation

## 2022-04-16 DIAGNOSIS — N83202 Unspecified ovarian cyst, left side: Secondary | ICD-10-CM | POA: Diagnosis not present

## 2022-04-16 DIAGNOSIS — R319 Hematuria, unspecified: Secondary | ICD-10-CM | POA: Diagnosis present

## 2022-04-16 LAB — URINALYSIS, ROUTINE W REFLEX MICROSCOPIC
Bilirubin Urine: NEGATIVE
Glucose, UA: NEGATIVE mg/dL
Ketones, ur: NEGATIVE mg/dL
Nitrite: NEGATIVE
Protein, ur: 100 mg/dL — AB
RBC / HPF: >50 RBC/hpf — ABNORMAL HIGH (ref 0–5)
Specific Gravity, Urine: 1.018 (ref 1.005–1.030)
pH: 6 (ref 5.0–8.0)

## 2022-04-16 LAB — CBC WITH DIFFERENTIAL/PLATELET
Abs Immature Granulocytes: 0.01 10*3/uL (ref 0.00–0.07)
Basophils Absolute: 0 10*3/uL (ref 0.0–0.1)
Basophils Relative: 0 %
Eosinophils Absolute: 0.1 10*3/uL (ref 0.0–0.5)
Eosinophils Relative: 1 %
HCT: 36.3 % (ref 36.0–46.0)
Hemoglobin: 12.5 g/dL (ref 12.0–15.0)
Immature Granulocytes: 0 %
Lymphocytes Relative: 42 %
Lymphs Abs: 2 10*3/uL (ref 0.7–4.0)
MCH: 30.3 pg (ref 26.0–34.0)
MCHC: 34.4 g/dL (ref 30.0–36.0)
MCV: 88.1 fL (ref 80.0–100.0)
Monocytes Absolute: 0.5 10*3/uL (ref 0.1–1.0)
Monocytes Relative: 10 %
Neutro Abs: 2.2 10*3/uL (ref 1.7–7.7)
Neutrophils Relative %: 47 %
Platelets: 184 10*3/uL (ref 150–400)
RBC: 4.12 MIL/uL (ref 3.87–5.11)
RDW: 12.4 % (ref 11.5–15.5)
WBC: 4.8 10*3/uL (ref 4.0–10.5)
nRBC: 0 % (ref 0.0–0.2)

## 2022-04-16 LAB — COMPREHENSIVE METABOLIC PANEL
ALT: 11 U/L (ref 0–44)
AST: 16 U/L (ref 15–41)
Albumin: 4 g/dL (ref 3.5–5.0)
Alkaline Phosphatase: 51 U/L (ref 38–126)
Anion gap: 9 (ref 5–15)
BUN: 17 mg/dL (ref 8–23)
CO2: 24 mmol/L (ref 22–32)
Calcium: 9.7 mg/dL (ref 8.9–10.3)
Chloride: 103 mmol/L (ref 98–111)
Creatinine, Ser: 0.83 mg/dL (ref 0.44–1.00)
GFR, Estimated: >60 mL/min (ref >60)
Glucose, Bld: 105 mg/dL — ABNORMAL HIGH (ref 70–99)
Potassium: 3 mmol/L — ABNORMAL LOW (ref 3.5–5.1)
Sodium: 136 mmol/L (ref 135–145)
Total Bilirubin: 0.6 mg/dL (ref 0.3–1.2)
Total Protein: 7.1 g/dL (ref 6.5–8.1)

## 2022-04-16 LAB — TYPE AND SCREEN
ABO/RH(D): O POS
Antibody Screen: NEGATIVE

## 2022-04-16 MED ORDER — POTASSIUM CHLORIDE CRYS ER 20 MEQ PO TBCR
40.0000 meq | EXTENDED_RELEASE_TABLET | Freq: Once | ORAL | Status: AC
  Administered 2022-04-17: 40 meq via ORAL
  Filled 2022-04-16: qty 2

## 2022-04-16 MED ORDER — CEPHALEXIN 500 MG PO CAPS: 500.0000 mg | ORAL_CAPSULE | Freq: Four times a day (QID) | ORAL | 0 refills | Status: DC

## 2022-04-16 NOTE — ED Provider Notes (Signed)
[place] EMERGENCY DEPARTMENT Provider Note   CSN: [id number] Arrival date & time: 04/16/22  1404     History {Add pertinent medical, surgical, social history, OB history to HPI:1} Chief Complaint  Patient presents with  . Hematuria    Heather Gillespie is a 61 y.o. female.  She is here with a complaint of some blood in her urine and low abdominal pain that is been going on for over a week.  She said she went to urgent care and they put her on antibiotics for infection which helped initially but seems to have recurred.  She also has had some rectal bleeding which she is seeing her GI doctor for and they have told her it is hemorrhoids.  She said she had a CAT scan and is supposed to go to the pharmacy and get some Preparation H.  She denies any chest pain shortness of breath dizziness lightheadedness.  She is not on any blood thinners.  The history is provided by the patient.  Hematuria This is a new problem. The current episode started more than 1 week ago. The problem has not changed since onset.Associated symptoms include abdominal pain. Pertinent negatives include no chest pain, no headaches and no shortness of breath. Nothing aggravates the symptoms. Nothing relieves the symptoms. Treatments tried: Antibiotics. The treatment provided no relief.       Home Medications Prior to Admission medications   Medication Sig Start Date End Date Taking? Authorizing Provider  alendronate (FOSAMAX) 70 MG tablet TAKE 1 TABLET BY MOUTH EVERY 7 DAYS. TAKE WITH A FULL GLASS OF WATER ON AN EMPTY STOMACH. 11/06/21   Susy Frizzle, MD  cromolyn (OPTICROM) 4 % ophthalmic solution INSTILL 1 DROP INTO BOTH EYES 4 TIMES A DAY. 03/12/22   Susy Frizzle, MD  DENTA 5000 PLUS 1.1 % CREA dental cream Take by mouth 2 (two) times daily. 05/15/20   [provider]  dexlansoprazole (DEXILANT) 60 MG capsule Take 60 mg by mouth daily.    [provider]  doxycycline  (VIBRA-TABS) 100 MG tablet Take 100 mg by mouth 2 (two) times daily. 04/09/22   [provider]  doxycycline (VIBRAMYCIN) 100 MG capsule Take 1 capsule (100 mg total) by mouth 2 (two) times daily. 04/09/22   Hans Eden, NP  Ergocalciferol (VITAMIN D2) 2000 units TABS Take 1 tablet by mouth daily.    [provider]  fexofenadine (ALLEGRA) 180 MG tablet Take 1 tablet (180 mg total) by mouth daily. 10/04/21   Susy Frizzle, MD  fluconazole (DIFLUCAN) 150 MG tablet Take 150 mg by mouth once. 04/02/22   [provider]  fluticasone (FLONASE) 50 MCG/ACT nasal spray SPRAY 2 SPRAYS INTO EACH NOSTRIL EVERY DAY 11/28/21   Susy Frizzle, MD  gabapentin (NEURONTIN) 300 MG capsule 1 capsule TID 08/13/21   Susy Frizzle, MD  hydrochlorothiazide (HYDRODIURIL) 25 MG tablet Take 1 tablet (25 mg total) by mouth daily. 09/10/21   Susy Frizzle, MD  ibuprofen (ADVIL) 800 MG tablet Take 1 tablet (800 mg total) by mouth 3 (three) times daily. 03/28/22   Raspet, Derry Skill, PA-C  losartan (COZAAR) 100 MG tablet Take 1 tablet (100 mg total) by mouth daily. 08/30/21   Susy Frizzle, MD  oxyCODONE-acetaminophen (PERCOCET) 5-325 MG tablet Take 1-2 tablets by mouth every 4 (four) hours as needed for severe pain. 12/24/20   Felipa Furnace, DPM  oxyCODONE-acetaminophen (PERCOCET) 5-325 MG tablet Take 1-2 tablets  by mouth every 4 (four) hours as needed for severe pain. 02/25/21   Felipa Furnace, DPM  PATADAY 0.7 % SOLN INSTILL 1 DROP INTO BOTH EYES EVERY DAY 10/15/21   Susy Frizzle, MD  phenazopyridine (PYRIDIUM) 200 MG tablet Take 1 tablet (200 mg total) by mouth 3 (three) times daily as needed for pain. 11/17/20   Melynda Ripple, MD  valACYclovir (VALTREX) 1000 MG tablet Take 1 tablet (1,000 mg total) by mouth 3 (three) times daily for 7 days. 04/09/22 04/16/22  Hans Eden, NP      Allergies    Hydroxyzine, Lotronex [alosetron hcl], Other, and Hydroxyzine pamoate    Review  of Systems   Review of Systems  Constitutional:  Negative for fever.  HENT:  Negative for sore throat.   Eyes:  Negative for visual disturbance.  Respiratory:  Negative for shortness of breath.   Cardiovascular:  Negative for chest pain.  Gastrointestinal:  Positive for abdominal pain and blood in stool. Negative for nausea and vomiting.  Genitourinary:  Positive for dysuria and hematuria.  Neurological:  Negative for headaches.    Physical Exam Updated Vital Signs BP 115/68 (BP Location: Right Arm)   Pulse 61   Temp 98.4 F (36.9 C) (Oral)   Resp 14   Ht '5\' 2"'$  (1.575 m)   Wt 50.8 kg   SpO2 100%   BMI 20.49 kg/m  Physical Exam Vitals and nursing note reviewed.  Constitutional:      General: She is not in acute distress.    Appearance: Normal appearance. She is well-developed.  HENT:     Head: Normocephalic and atraumatic.  Eyes:     Conjunctiva/sclera: Conjunctivae normal.  Cardiovascular:     Rate and Rhythm: Normal rate and regular rhythm.     Heart sounds: No murmur heard. Pulmonary:     Effort: Pulmonary effort is normal. No respiratory distress.     Breath sounds: Normal breath sounds.  Abdominal:     Palpations: Abdomen is soft.     Tenderness: There is abdominal tenderness. There is no guarding or rebound.  Musculoskeletal:        General: No swelling.     Cervical back: Neck supple.  Skin:    General: Skin is warm and dry.     Capillary Refill: Capillary refill takes less than 2 seconds.  Neurological:     General: No focal deficit present.     Mental Status: She is alert.    ED Results / Procedures / Treatments   Labs (all labs ordered are listed, but only abnormal results are displayed) Labs Reviewed  URINALYSIS, ROUTINE W REFLEX MICROSCOPIC - Abnormal; Notable for the following components:      Result Value   Color, Urine AMBER (*)    APPearance CLOUDY (*)    Hgb urine dipstick MODERATE (*)    Protein, ur 100 (*)    Leukocytes,Ua TRACE (*)     RBC / HPF >50 (*)    Bacteria, UA MANY (*)    All other components within normal limits  COMPREHENSIVE METABOLIC PANEL - Abnormal; Notable for the following components:   Potassium 3.0 (*)    Glucose, Bld 105 (*)    All other components within normal limits  CBC WITH DIFFERENTIAL/PLATELET  CBC WITH DIFFERENTIAL/PLATELET  TYPE AND SCREEN    EKG None  Radiology DG UGI W DOUBLE CM (HD BA)  Result Date: 04/15/2022 CLINICAL DATA:  Epigastric pain with postprandial pressure sensation. EXAM:  UPPER GI SERIES WITH KUB TECHNIQUE: After obtaining a scout radiograph a routine upper GI series was performed using thin and high density barium. FLUOROSCOPY: Radiation Exposure Index (as provided by the fluoroscopic device): 8.7 mGy Kerma COMPARISON:  Abdominal radiographs 01/15/2019. Abdominopelvic CT 04/03/2011. FINDINGS: The scout abdominal radiograph demonstrates a normal bowel gas pattern status post cholecystectomy. Probable postsurgical changes in the lower lumbar spine. The patient swallowed the barium without difficulty. Rapid sequence imaging of the pharynx in the AP and lateral projections demonstrates no laryngeal penetration or mucosal abnormalities. The esophageal motility is normal. There is no evidence of esophageal mass, stricture or ulceration. No significant hiatal hernia or gastroesophageal reflux was elicited with the water siphon test. The stomach and duodenum appear normal, without ulceration or gastric outlet obstruction. Gastric fold pattern within normal limits. At the conclusion of the study, a 13 mm barium tablet was administered. This passed without delay into the stomach. IMPRESSION: Unremarkable upper GI series. Electronically Signed   By: Richardean Sale M.D.   On: 04/15/2022 08:39    Procedures Procedures  {Document cardiac monitor, telemetry assessment procedure when appropriate:1}  Medications Ordered in ED Medications - No data to display  ED Course/ Medical Decision  Making/ A&P                           Medical Decision Making Amount and/or Complexity of Data Reviewed Labs: ordered.   ***  {Document critical care time when appropriate:1} {Document review of labs and clinical decision tools ie heart score, Chads2Vasc2 etc:1}  {Document your independent review of radiology images, and any outside records:1} {Document your discussion with family members, caretakers, and with consultants:1} {Document social determinants of health affecting pt's care:1} {Document your decision making why or why not admission, treatments were needed:1} Final Clinical Impression(s) / ED Diagnoses Final diagnoses:  None    Rx / DC Orders ED Discharge Orders     None

## 2022-04-16 NOTE — Discharge Instructions (Signed)
You were seen in the emergency department for blood in urine along with some rectal bleeding.  You had blood work urinalysis and a CAT scan of your abdomen and pelvis.  There is no obvious explanation for your bleeding but recovering you with antibiotics for possible urinary tract infection.  You do have an ovarian cyst that will need a follow-up ultrasound.  Please keep well-hydrated.  Follow-up with your primary care doctor and your GI team.  You may end up needing referral to urology for further evaluation of the blood in your urine.  Return to the emergency department if any worsening or concerning symptoms

## 2022-04-16 NOTE — ED Triage Notes (Signed)
Pt c/o hematuria and lower abd pain. Pt states she was treated by UC a week ago and was given abx. Pt states that the symptoms abated while taking the abx, but returned after the abx was finished.   Pt also c/o bleeding hemorrhoids.

## 2022-04-16 NOTE — ED Provider Triage Note (Signed)
Emergency Medicine Provider Triage Evaluation Note  Heather Gillespie , a 61 y.o. female  was evaluated in triage.  Pt complains of hematuria for the past week.  Patient reports that she was recently treated for UTI and the bleeding went away however when she stopped taking the antibiotic when she finished it, the bleeding returned and not painful as well.  She also complains of bleeding hemorrhoids.  Complains of abdominal pain.  Denies any nausea, vomiting, fevers, or melena..  Review of Systems  Positive:  Negative:   Physical Exam  BP 122/68   Pulse 81   Temp (!) 97.5 F (36.4 C) (Oral)   Resp 16   Ht '5\' 2"'$  (1.575 m)   Wt 50.8 kg   SpO2 98%   BMI 20.49 kg/m  Gen:   Awake, no distress   Resp:  Normal effort  MSK:   Moves extremities without difficulty  Other:  Suprapubic tenderness without guarding or rebound.  Medical Decision Making  Medically screening exam initiated at 3:20 PM.  Appropriate orders placed.  Caesar Chestnut was informed that the remainder of the evaluation will be completed by another provider, this initial triage assessment does not replace that evaluation, and the importance of remaining in the ED until their evaluation is complete.  Labs ordered   Sherrell Puller, [place] 04/16/22 [id number]

## 2022-04-17 NOTE — ED Notes (Signed)
All discharge instructions including follow up care and prescriptions reviewed with patient and patient verbalized understanding of same. Patient stable and ambulatory at time of discharge.  

## 2022-04-21 ENCOUNTER — Ambulatory Visit (INDEPENDENT_AMBULATORY_CARE_PROVIDER_SITE_OTHER): Payer: PRIVATE HEALTH INSURANCE | Admitting: Family Medicine

## 2022-04-21 VITALS — BP 126/62 | HR 66 | Temp 97.7°F | Ht 62.0 in | Wt 114.0 lb

## 2022-04-21 DIAGNOSIS — K625 Hemorrhage of anus and rectum: Secondary | ICD-10-CM

## 2022-04-21 DIAGNOSIS — R310 Gross hematuria: Secondary | ICD-10-CM

## 2022-04-21 LAB — URINALYSIS, ROUTINE W REFLEX MICROSCOPIC
Bilirubin Urine: NEGATIVE
Glucose, UA: NEGATIVE
Hyaline Cast: NONE SEEN /LPF
Ketones, ur: NEGATIVE
Leukocytes,Ua: NEGATIVE
Nitrite: POSITIVE — AB
Specific Gravity, Urine: 1.02 (ref 1.001–1.035)
WBC, UA: NONE SEEN /HPF (ref 0–5)
pH: 5.5 (ref 5.0–8.0)

## 2022-04-21 LAB — MICROSCOPIC MESSAGE

## 2022-04-21 NOTE — Progress Notes (Signed)
Subjective:    Patient ID: Heather Gillespie, female    DOB: September 09, 1961, 61 y.o.   MRN: [id number]  HPI   Patient has a very complex past medical history.  She reports that she has had numerous surgeries due to hemorrhoids.  She states that she has had over 20 surgeries in the past for this.  She states that recently she has been having copious amounts of rectal bleeding.  She has a picture today of a waste paper basket filled with toilet paper and soaked in bright red blood.  She states this is been going on now for almost 2 weeks.  She went to an urgent care on July 21 and was diagnosed with a urinary tract infection.  Apparently this was because of hematuria and an abnormal urinalysis.  However now the patient states that she was having copious rectal bleeding when she gave a urine sample.  Urine culture showed multiple species most likely contamination.  She then went to the emergency room on August 2.  She went to the emergency room due to gross hematuria.  Urinalysis showed 3+ hemoglobin.  She had an CT scan: FINDINGS: Lower chest: No acute abnormality.   Hepatobiliary: No suspicious focal liver abnormality is seen. Cholecystectomy. No biliary ductal dilation.   Pancreas: Unremarkable. No pancreatic ductal dilatation or surrounding inflammatory changes.   Spleen: Normal in size without focal abnormality.   Adrenals/Urinary Tract: Adrenal glands are unremarkable. Kidneys are normal, without renal calculi, suspicious focal lesion, or hydronephrosis. Bladder is unremarkable.   Stomach/Bowel: Stomach is within normal limits. The appendix is normal. No evidence of bowel wall thickening, distention, or inflammatory changes.   Vascular/Lymphatic: Mild aortic atherosclerosis. No enlarged abdominal or pelvic lymph nodes.   Reproductive: 4.8 cm cystic lesion in the right adnexa with central homogeneous low fluid attenuation.   Other: No free intraperitoneal fluid or air.    Musculoskeletal: No acute or significant osseous findings. She is here today for follow-up.  Again she has +3 blood on urinalysis.  She has positive nitrates, negative leukocyte esterase.  She denies any dysuria.  She has been on Bactrim as well as Keflex.  She states that she is having blood come out of her rectum as well.  I performed a rectal exam today.  External rectal exam shows no external hemorrhoids or fissures or skin tears or visible blood.  I am unable to perform an internal exam.  She had a colonoscopy last year that showed angiodysplasia inside the rectum.  She also had several polyps.  At that time there were no internal hemorrhoids. Past Medical History:  Diagnosis Date   Abdominal pain    Allergy    GERD (gastroesophageal reflux disease)    Glaucoma    Hemorrhoids    Hiatal hernia    HTN (hypertension)    IBS (irritable bowel syndrome)    N&V (nausea and vomiting)    Osteoporosis    Rectal bleeding    Rectal ulcer    Sinus problem    TMJ (dislocation of temporomandibular joint)    Vitamin D deficiency    Past Surgical History:  Procedure Laterality Date   ANAL FISSURE REPAIR     BACK SURGERY     CHOLECYSTECTOMY  1984   COLONOSCOPY W/ POLYPECTOMY     colon ulcers   EYE SURGERY     HEMI-MICRODISCECTOMY LUMBAR LAMINECTOMY LEVEL 1 Left 05/26/2013   Procedure: HEMI-MICRODISCECTOMY LUMBAR LAMINECTOMY L4 - L5 ON THE LEFT LEVEL 1;  Surgeon: Tobi Bastos, MD;  Location: [facility];  Service: Orthopedics;  Laterality: Left;   HEMORROIDECTOMY     Current Outpatient Medications on File Prior to Visit  Medication Sig Dispense Refill   alendronate (FOSAMAX) 70 MG tablet TAKE 1 TABLET BY MOUTH EVERY 7 DAYS. TAKE WITH A FULL GLASS OF WATER ON AN EMPTY STOMACH. 4 tablet 11   cephALEXin (KEFLEX) 500 MG capsule Take 1 capsule (500 mg total) by mouth 4 (four) times daily. 28 capsule 0   cromolyn (OPTICROM) 4 % ophthalmic solution INSTILL 1 DROP INTO BOTH EYES 4 TIMES A DAY. (Patient  taking differently: Place 1 drop into both eyes 4 (four) times daily.) 10 mL 2   dexlansoprazole (DEXILANT) 60 MG capsule Take 60 mg by mouth daily.     Ergocalciferol (VITAMIN D2) 2000 units TABS Take 1 tablet by mouth daily.     fexofenadine (ALLEGRA) 180 MG tablet Take 1 tablet (180 mg total) by mouth daily. 90 tablet 3   fluticasone (FLONASE) 50 MCG/ACT nasal spray SPRAY 2 SPRAYS INTO EACH NOSTRIL EVERY DAY (Patient taking differently: Place 2 sprays into both nostrils daily.) 16 mL 11   gabapentin (NEURONTIN) 300 MG capsule 1 capsule TID (Patient taking differently: Take 300 mg by mouth 3 (three) times daily.) 90 capsule 3   hydrochlorothiazide (HYDRODIURIL) 25 MG tablet Take 1 tablet (25 mg total) by mouth daily. 90 tablet 3   ibuprofen (ADVIL) 800 MG tablet Take 1 tablet (800 mg total) by mouth 3 (three) times daily. 21 tablet 0   losartan (COZAAR) 100 MG tablet Take 1 tablet (100 mg total) by mouth daily. 90 tablet 3   oxyCODONE-acetaminophen (PERCOCET) 5-325 MG tablet Take 1-2 tablets by mouth every 4 (four) hours as needed for severe pain. 30 tablet 0   PATADAY 0.7 % SOLN INSTILL 1 DROP INTO BOTH EYES EVERY DAY 2.5 mL 3   phenazopyridine (PYRIDIUM) 200 MG tablet Take 1 tablet (200 mg total) by mouth 3 (three) times daily as needed for pain. 6 tablet 0   doxycycline (VIBRA-TABS) 100 MG tablet Take 100 mg by mouth 2 (two) times daily. (Patient not taking: Reported on 04/21/2022)     oxyCODONE-acetaminophen (PERCOCET) 5-325 MG tablet Take 1-2 tablets by mouth every 4 (four) hours as needed for severe pain. (Patient not taking: Reported on 04/21/2022) 30 tablet 0   No current facility-administered medications on file prior to visit.   Allergies  Allergen Reactions   Hydroxyzine     Other reaction(s): Unknown   Lotronex [Alosetron Hcl]     Other reaction(s): Unknown   Other Other (See Comments)   Hydroxyzine Pamoate Rash   Social History   Socioeconomic History   Marital status: Single     Spouse name: Not on file   Number of children: Not on file   Years of education: Not on file   Highest education level: Not on file  Occupational History   Not on file  Tobacco Use   Smoking status: Never   Smokeless tobacco: Never  Vaping Use   Vaping Use: Never used  Substance and Sexual Activity   Alcohol use: No   Drug use: No   Sexual activity: Not Currently  Other Topics Concern   Not on file  Social History Narrative   Not on file   Social Determinants of Health   Financial Resource Strain: Not on file  Food Insecurity: Not on file  Transportation Needs: Not on file  Physical Activity:  Not on file  Stress: Not on file  Social Connections: Not on file  Intimate Partner Violence: Not on file     Review of Systems  All other systems reviewed and are negative.      Objective:   Physical Exam Vitals reviewed.  Constitutional:      Appearance: Normal appearance. She is normal weight.  Cardiovascular:     Rate and Rhythm: Normal rate and regular rhythm.     Heart sounds: Normal heart sounds.  Pulmonary:     Effort: Pulmonary effort is normal.     Breath sounds: Normal breath sounds.  Abdominal:     General: Bowel sounds are normal.     Palpations: Abdomen is soft.  Genitourinary:    Rectum: Normal. No mass, tenderness, anal fissure or external hemorrhoid. Normal anal tone.  Neurological:     Mental Status: She is alert.           Assessment & Plan:  Gross hematuria - Plan: Urinalysis, Routine w reflex microscopic  Rectal bleeding At this point I am not sure if her gross hematuria is due to rectal bleeding contaminating the sample or if she has rectal bleeding and gross hematuria simultaneously.  Patient is most concerned today about rectal bleeding.  Rectal exam is unremarkable.  I will set the patient up to see her gastroenterologist as I believe she would benefit from sigmoidoscopy.  If there is an active source of bleeding inside the rectum,  perhaps they can facilitate hemostasis.  Once this source of bleeding has been resolved, repeat urinalysis and if blood persists, the patient will need cystoscopy given the normal CT scan.

## 2022-04-22 ENCOUNTER — Telehealth: Payer: Self-pay

## 2022-04-22 NOTE — Telephone Encounter (Signed)
FYI  Spoke w/pt re lab results. Pt aware. However, pt wanted me to let you know she is bleeding really bad today.

## 2022-04-28 ENCOUNTER — Telehealth: Payer: Self-pay

## 2022-04-28 NOTE — Telephone Encounter (Signed)
Pt's brother,Jay,  wants to know why pt must see a Urologist and GI specialist?  Pls advice

## 2022-05-05 NOTE — Telephone Encounter (Signed)
Called and spoke w/pt regarding the reason why she had to go see the specialist is due to her blood in her urine.   Pt voiced understanding. Nothing further.

## 2022-05-05 NOTE — Telephone Encounter (Signed)
Heather Gillespie, [facility]  04/22/2022 10:37 AM EDT Back to Top    Pt aware pt voiced understanding. Nothing further needed at this time.    Susy Frizzle, MD  04/22/2022  6:36 AM EDT     Given the blood that is in her urine, I went ahead and sent a referral over to urology to get her on schedule to see them.  We will certainly proceed with a GI consult as we discussed.  However if the hematuria persists, she will likely need a cystoscopy  Per Lab results

## 2022-05-08 ENCOUNTER — Telehealth: Payer: Self-pay

## 2022-05-08 ENCOUNTER — Other Ambulatory Visit: Payer: Self-pay | Admitting: Family Medicine

## 2022-05-08 MED ORDER — GABAPENTIN 300 MG PO CAPS: 300.0000 mg | ORAL_CAPSULE | Freq: Three times a day (TID) | ORAL | 3 refills | Status: DC

## 2022-05-08 NOTE — Telephone Encounter (Signed)
Refill requested on pt's Gabapentin 300 mg from pharmacy. Last refill 08/13/2021. Last OV 04/21/2022. Pt uses [facility] [place]. Thank you.

## 2022-05-21 ENCOUNTER — Other Ambulatory Visit: Payer: Self-pay

## 2022-05-21 NOTE — Telephone Encounter (Signed)
Pharmacy faxed a refill request for  cromolyn (OPTICROM) 4 % ophthalmic solution [[id number]]    Order Details Dose, Route, Frequency: As Directed  Dispense Quantity: 10 mL Refills: 2        Sig: INSTILL 1 DROP INTO BOTH EYES 4 TIMES A DAY.  Patient taking differently: Place 1 drop into both eyes 4 (four) times daily.       Start Date: 03/12/22 End Date: --  Written Date: 03/12/22 Expiration Date: 03/12/23

## 2022-05-22 MED ORDER — CROMOLYN SODIUM 4 % OP SOLN: OPHTHALMIC | 2 refills | Status: DC

## 2022-05-22 NOTE — Telephone Encounter (Signed)
Requested Prescriptions  Pending Prescriptions Disp Refills  . cromolyn (OPTICROM) 4 % ophthalmic solution 10 mL 2    Sig: INSTILL 1 DROP INTO BOTH EYES 4 TIMES A DAY. Strength: 4 %     Ophthalmology:  [facility] Passed - 05/21/2022  2:41 PM      Passed - Valid encounter within last 12 months    Recent Outpatient Visits          5 months ago Osteoporosis, unspecified osteoporosis type, unspecified pathological fracture presence   [place] Dennard Schaumann, Cammie Mcgee, MD   11 months ago Gross hematuria   [place] Dennard Schaumann, Cammie Mcgee, MD   11 months ago General medical exam   [place] Susy Frizzle, MD   1 year ago General medical exam   [place] Susy Frizzle, MD   2 years ago General medical exam   [place] Susy Frizzle, MD      Future Appointments            In 2 weeks Pickard, Cammie Mcgee, MD [place]

## 2022-05-22 NOTE — Telephone Encounter (Signed)
Prescription will only print - not able to send electronically Requested Prescriptions  Signed Prescriptions Disp Refills   cromolyn (OPTICROM) 4 % ophthalmic solution 10 mL 2    Sig: INSTILL 1 DROP INTO BOTH EYES 4 TIMES A DAY. Strength: 4 %     Ophthalmology:  Antiallergy Passed - 05/21/2022  2:41 PM      Passed - Valid encounter within last 12 months    Recent Outpatient Visits           5 months ago Osteoporosis, unspecified osteoporosis type, unspecified pathological fracture presence   [place] Pickard, Cammie Mcgee, MD   11 months ago Gross hematuria   [place] Dennard Schaumann, Cammie Mcgee, MD   11 months ago General medical exam   [place] Susy Frizzle, MD   1 year ago General medical exam   [place] Susy Frizzle, MD   2 years ago General medical exam   [place] Susy Frizzle, MD       Future Appointments             In 2 weeks Dennard Schaumann, Cammie Mcgee, MD [facility]             cromolyn (OPTICROM) 4 % ophthalmic solution 10 mL 2    Sig: INSTILL 1 DROP INTO BOTH EYES 4 TIMES A DAY. Strength: 4 %     There is no refill protocol information for this order

## 2022-05-22 NOTE — Addendum Note (Signed)
Addended by: Carlisle Beers on: 05/22/2022 01:34 PM   Modules accepted: Orders

## 2022-05-23 ENCOUNTER — Encounter (HOSPITAL_COMMUNITY): Payer: Self-pay

## 2022-05-23 ENCOUNTER — Telehealth: Payer: Self-pay

## 2022-05-23 ENCOUNTER — Ambulatory Visit (HOSPITAL_COMMUNITY)
Admission: EM | Admit: 2022-05-23 | Discharge: 2022-05-23 | Disposition: A | Discharge status: Home / Self Care | Payer: Medicaid Other | Attending: Internal Medicine | Admitting: Internal Medicine

## 2022-05-23 ENCOUNTER — Ambulatory Visit (INDEPENDENT_AMBULATORY_CARE_PROVIDER_SITE_OTHER): Payer: Medicaid Other

## 2022-05-23 DIAGNOSIS — R8290 Unspecified abnormal findings in urine: Secondary | ICD-10-CM | POA: Insufficient documentation

## 2022-05-23 DIAGNOSIS — R109 Unspecified abdominal pain: Secondary | ICD-10-CM | POA: Diagnosis not present

## 2022-05-23 DIAGNOSIS — R82998 Other abnormal findings in urine: Secondary | ICD-10-CM | POA: Diagnosis present

## 2022-05-23 LAB — CBC WITH DIFFERENTIAL/PLATELET
Abs Immature Granulocytes: 0.01 10*3/uL (ref 0.00–0.07)
Basophils Absolute: 0 10*3/uL (ref 0.0–0.1)
Basophils Relative: 1 %
Eosinophils Absolute: 0 10*3/uL (ref 0.0–0.5)
Eosinophils Relative: 1 %
HCT: 37.9 % (ref 36.0–46.0)
Hemoglobin: 12.7 g/dL (ref 12.0–15.0)
Immature Granulocytes: 0 %
Lymphocytes Relative: 28 %
Lymphs Abs: 1.2 10*3/uL (ref 0.7–4.0)
MCH: 30.5 pg (ref 26.0–34.0)
MCHC: 33.5 g/dL (ref 30.0–36.0)
MCV: 91.1 fL (ref 80.0–100.0)
Monocytes Absolute: 0.4 10*3/uL (ref 0.1–1.0)
Monocytes Relative: 9 %
Neutro Abs: 2.6 10*3/uL (ref 1.7–7.7)
Neutrophils Relative %: 61 %
Platelets: 206 10*3/uL (ref 150–400)
RBC: 4.16 MIL/uL (ref 3.87–5.11)
RDW: 12.7 % (ref 11.5–15.5)
WBC: 4.2 10*3/uL (ref 4.0–10.5)
nRBC: 0 % (ref 0.0–0.2)

## 2022-05-23 LAB — BASIC METABOLIC PANEL
Anion gap: 10 (ref 5–15)
BUN: 12 mg/dL (ref 8–23)
CO2: 25 mmol/L (ref 22–32)
Calcium: 10 mg/dL (ref 8.9–10.3)
Chloride: 101 mmol/L (ref 98–111)
Creatinine, Ser: 1.01 mg/dL — ABNORMAL HIGH (ref 0.44–1.00)
GFR, Estimated: >60 mL/min (ref >60)
Glucose, Bld: 109 mg/dL — ABNORMAL HIGH (ref 70–99)
Potassium: 3.9 mmol/L (ref 3.5–5.1)
Sodium: 136 mmol/L (ref 135–145)

## 2022-05-23 LAB — CK: Total CK: 47 U/L (ref 38–234)

## 2022-05-23 LAB — LIPASE, BLOOD: Lipase: 59 U/L — ABNORMAL HIGH (ref 11–51)

## 2022-05-23 MED ORDER — CEFTRIAXONE SODIUM 1 G IJ SOLR
INTRAMUSCULAR | Status: AC
  Filled 2022-05-23: qty 10

## 2022-05-23 MED ORDER — LIDOCAINE HCL (PF) 1 % IJ SOLN
INTRAMUSCULAR | Status: AC
  Filled 2022-05-23: qty 2

## 2022-05-23 MED ORDER — SULFAMETHOXAZOLE-TRIMETHOPRIM 800-160 MG PO TABS
1.0000 | ORAL_TABLET | Freq: Two times a day (BID) | ORAL | 0 refills | Status: AC
Start: 2022-05-23 — End: 2022-05-30

## 2022-05-23 MED ORDER — CEFTRIAXONE SODIUM 1 G IJ SOLR
1.0000 g | Freq: Once | INTRAMUSCULAR | Status: AC
  Administered 2022-05-23: 1 g via INTRAMUSCULAR

## 2022-05-23 NOTE — Discharge Instructions (Signed)
We gave the injection of antibiotics today.  Start Bactrim DS twice daily.  We will contact you if we need to change your antibiotics based on your culture result.  I will be in touch with your lab work.  Make sure you are drinking plenty of fluid.  I do still think the safest thing is to go to the emergency room.  If your symptoms or not improving quickly with the antibiotic you must go to the emergency room immediately.  Someone should reevaluate you within the next few days; this can be done at our office assuming you are improving and feeling better.  You will need to see a urologist.  Please call them first thing Monday morning to see if they can work you in.

## 2022-05-23 NOTE — ED Triage Notes (Signed)
Pt c/o burning on urination, urgency/frequency, blood in urine, and lt flank pain x1 wk. States pain is unbearable and taking OTC with little relief.

## 2022-05-23 NOTE — ED Provider Notes (Signed)
[facility]    CSN: [id number] Arrival date & time: 05/23/22  1133      History   Chief Complaint Chief Complaint  Patient presents with   Urinary Tract Infection   Flank Pain    HPI Heather Gillespie is a 61 y.o. female.   Patient presents today with a weeklong history of UTI symptoms.  Reports urinary frequency, urgency, left flank pain, hematuria, dysuria.  She has not tried over-the-counter medication for symptom management.  She does have a history of recurrent UTI and was last treated 04/02/2022.  Reports symptoms improved with antibiotics.  Culture was obtained that grew multiple species and recollection was not attempted given improvement with antibiotics.  She has had intermittent hematuria since that time and is in the process of scheduling an appointment with urology.  She has had difficulty arranging this due to her insurance.  Denies any recent urogenital procedure, self-catheterization, history of nephrolithiasis.  She does report occasional nausea but denies any vomiting.  She has felt feverish but not recorded temperature.  Denies additional antibiotics since July 2023.  She did have CT scan 04/16/2022 that showed no evidence of nephrolithiasis or abnormal kidneys.    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 05/26/2013   Vitamin D deficiency    TMJ (dislocation of temporomandibular joint)    Osteoporosis    Rectal pain  03/31/2011   ANEMIA, IRON DEFICIENCY NOS 02/19/2007   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   HEMORRHOIDS NOS, W/O COMPLICATIONS 24/26/8341   RHINITIS, ALLERGIC NOS 02/19/2007   IBS 02/19/2007   DIARRHEA, CHRONIC 02/19/2007   UTI (urinary tract infection) 02/19/2007    Past Surgical History:  Procedure Laterality Date   ANAL FISSURE REPAIR     BACK SURGERY     CHOLECYSTECTOMY  1984   COLONOSCOPY W/ POLYPECTOMY     colon ulcers   EYE SURGERY     HEMI-MICRODISCECTOMY LUMBAR LAMINECTOMY LEVEL 1 Left 05/26/2013   Procedure: HEMI-MICRODISCECTOMY LUMBAR LAMINECTOMY L4 - L5 ON THE LEFT LEVEL 1;  Surgeon: Tobi Bastos, MD;  Location: [facility];  Service: Orthopedics;  Laterality: Left;   HEMORROIDECTOMY      OB History   No obstetric history on file.      Home Medications    Prior to Admission medications   Medication Sig Start Date End Date Taking? Authorizing Provider  sulfamethoxazole-trimethoprim (BACTRIM DS) 800-160 MG tablet Take 1 tablet by mouth 2 (two) times daily for 7 days. 05/23/22 05/30/22 Yes Tyjai Matuszak K, PA-C  alendronate (FOSAMAX) 70 MG tablet TAKE 1 TABLET BY MOUTH EVERY 7 DAYS. TAKE WITH A FULL GLASS OF WATER ON AN EMPTY STOMACH. 11/06/21   Susy Frizzle, MD  cromolyn (OPTICROM) 4 % ophthalmic solution INSTILL 1 DROP INTO BOTH EYES 4 TIMES A DAY. Strength: 4 % 05/22/22   Pickard,  Cammie Mcgee, MD  dexlansoprazole (DEXILANT) 60 MG capsule Take 60 mg by mouth daily.    [provider]  Ergocalciferol (VITAMIN D2) 2000 units TABS Take 1 tablet by mouth daily.    [provider]  fexofenadine (ALLEGRA) 180 MG tablet Take 1 tablet (180 mg total) by mouth daily. 10/04/21   Susy Frizzle, MD  fluticasone (FLONASE) 50 MCG/ACT nasal spray SPRAY 2 SPRAYS INTO EACH NOSTRIL EVERY DAY Patient taking differently: Place 2 sprays into both nostrils daily. 11/28/21   Susy Frizzle, MD  gabapentin (NEURONTIN) 300 MG capsule Take 1 capsule (300 mg total) by mouth 3  (three) times daily. 05/08/22   Susy Frizzle, MD  hydrochlorothiazide (HYDRODIURIL) 25 MG tablet Take 1 tablet (25 mg total) by mouth daily. 09/10/21   Susy Frizzle, MD  ibuprofen (ADVIL) 800 MG tablet Take 1 tablet (800 mg total) by mouth 3 (three) times daily. 03/28/22   Raji Glinski, Derry Skill, PA-C  losartan (COZAAR) 100 MG tablet Take 1 tablet (100 mg total) by mouth daily. 08/30/21   Susy Frizzle, MD  PATADAY 0.7 % SOLN INSTILL 1 DROP INTO BOTH EYES EVERY DAY 10/15/21   Susy Frizzle, MD    Family History Family History  Problem Relation Age of Onset   Diabetes Mother    Cancer Mother    Stroke Father    Breast cancer Neg Hx     Social History Social History   Tobacco Use   Smoking status: Never   Smokeless tobacco: Never  Vaping Use   Vaping Use: Never used  Substance Use Topics   Alcohol use: No   Drug use: No     Allergies   Hydroxyzine, Lotronex [alosetron hcl], Other, and Hydroxyzine pamoate   Review of Systems Review of Systems  Constitutional:  Positive for activity change. Negative for appetite change, fatigue and fever.  Cardiovascular:  Negative for chest pain.  Gastrointestinal:  Negative for abdominal pain, diarrhea, nausea and vomiting.  Genitourinary:  Positive for dysuria, flank pain, frequency, hematuria and urgency. Negative for vaginal bleeding, vaginal discharge and vaginal pain.     Physical Exam Triage Vital Signs ED Triage Vitals [05/23/22 1208]  Enc Vitals Group     BP 105/65     Pulse Rate 68     Resp 18     Temp 98.6 F (37 C)     Temp Source Oral     SpO2 97 %     Weight      Height      Head Circumference      Peak Flow      Pain Score 10     Pain Loc      Pain Edu?      Excl. in [place]?    No data found.  Updated Vital Signs BP 105/65 (BP Location: Left Arm)   Pulse 68   Temp 98.6 F (37 C) (Oral)   Resp 18   SpO2 97%   Visual Acuity Right Eye Distance:   Left Eye Distance:   Bilateral Distance:     Right Eye Near:   Left Eye Near:    Bilateral Near:     Physical Exam Vitals reviewed.  Constitutional:      General: She is awake. She is not in acute distress.    Appearance: Normal appearance. She is well-developed. She is not ill-appearing.     Comments: Very pleasant female appears stated age no acute distress  HENT:     Head: Normocephalic and atraumatic.     Mouth/Throat:     Pharynx: Uvula midline. No oropharyngeal exudate or posterior oropharyngeal erythema.  Cardiovascular:     Rate and Rhythm: Normal rate and regular rhythm.     Heart sounds: Normal heart sounds, S1 normal and S2 normal. No murmur heard. Pulmonary:     Effort: Pulmonary effort is normal.     Breath sounds: Normal breath sounds. No wheezing, rhonchi or rales.     Comments: Clear to auscultation bilaterally Abdominal:     General: Bowel sounds are normal.     Palpations: Abdomen is soft.     Tenderness: There is no abdominal tenderness. There is left CVA tenderness. There is no right CVA tenderness, guarding or rebound.  Psychiatric:        Behavior: Behavior is cooperative.      UC Treatments / Results  Labs (all labs ordered are listed, but only abnormal results are displayed) Labs Reviewed  CBC WITH DIFFERENTIAL/PLATELET  BASIC METABOLIC PANEL  CK  LIPASE, BLOOD  POCT URINALYSIS DIPSTICK, ED / UC    EKG   Radiology DG Abdomen 1 View  Result Date: 05/23/2022 CLINICAL DATA:  Flank pain EXAM: ABDOMEN - 1 VIEW COMPARISON:  01/15/2019 FINDINGS: The bowel gas pattern is normal. Cholecystectomy clips. Numerous pelvic phleboliths, grossly unchanged. No radio-opaque calculi or other significant radiographic abnormality are seen. IMPRESSION: Negative. Electronically Signed   By: Davina Poke D.O.   On: 05/23/2022 14:15    Procedures Procedures (including critical care time)  Medications Ordered in UC Medications  cefTRIAXone (ROCEPHIN) injection 1 g (has no administration in time range)     Initial Impression / Assessment and Plan / UC Course  I have reviewed the triage vital signs and the nursing notes.  Pertinent labs & imaging results that were available during my care of the patient were reviewed by me and considered in my medical decision making (see chart for details).     Patient is well-appearing, afebrile, nontoxic, nontachycardic.  Unfortunately, the urine specimen that she provided was discolored and dark as black coffee and still unable to run UA.  Discussed that given this abnormality and her severe pain this evening to do would be to go to the emergency room, however, patient declined this today.  She reports that she often gets urinary tract infections in the dark/black urine is common whenever these get severe.  She has previously been treated with antibiotics and had significant improvement of symptoms.  She is requesting this be done today.  KUB was obtained that showed no obvious nephrolithiasis.  Discussed that she would benefit from having a CT scan in the emergency room since we do not have any additional imaging capabilities in urgent care the patient again declined going to the ER.  Discussed that she ultimately needs to see a urologist that she is working with her primary care to establish with them.  She was given the contact information for local provider with instruction to call to schedule appointment to see if they can schedule her sooner.  CBC, CMP, CK, lipase obtained today-results pending.  Again discussed that the safest thing is to go to the emergency room but patient continued to decline this.  Ultimately, we agreed to empirically treat for UTI and she was given 1 g of Rocephin in clinic.  She was started on Bactrim DS as she safely taken this medication in the past.  Discussed that someone should  evaluate her tomorrow and this can be done by our clinic.  Had a long conversation that if anything changes or worsens she must go to the emergency room  immediately to which she agreed.  Final Clinical Impressions(s) / UC Diagnoses   Final diagnoses:  Abnormal urine  Black color of urine     Discharge Instructions      We gave the injection of antibiotics today.  Start Bactrim DS twice daily.  We will contact you if we need to change your antibiotics based on your culture result.  I will be in touch with your lab work.  Make sure you are drinking plenty of fluid.  I do still think the safest thing is to go to the emergency room.  If your symptoms or not improving quickly with the antibiotic you must go to the emergency room immediately.  Someone should reevaluate you within the next few days; this can be done at our office assuming you are improving and feeling better.  You will need to see a urologist.  Please call them first thing Monday morning to see if they can work you in.     ED Prescriptions     Medication Sig Dispense Auth. Provider   sulfamethoxazole-trimethoprim (BACTRIM DS) 800-160 MG tablet Take 1 tablet by mouth 2 (two) times daily for 7 days. 14 tablet Ryszard Socarras, Derry Skill, PA-C      PDMP not reviewed this encounter.   Terrilee Croak, PA-C 05/23/22 1447

## 2022-05-23 NOTE — Telephone Encounter (Signed)
Pt seen at [facility] for "black colored urine." Spoke with pt's brother Ulice Dash, he has been trying to get the patient in with a Urologist but is unable to. Ulice Dash states a referral was sent at pt's last visit on 04/21/22 and it was to [facility] but they have not heard anything. What else can they do? I asked Ulice Dash if he needed me to call the patient to try and convince her to go to the ER? Ulice Dash states they were told at Urgent Care that if "they go to the ER, whey would probably just send her home"

## 2022-05-23 NOTE — ED Notes (Signed)
Blood draw attempt x2 with no success.

## 2022-05-25 ENCOUNTER — Emergency Department (HOSPITAL_COMMUNITY)
Admission: EM | Admit: 2022-05-25 | Discharge: 2022-05-25 | Disposition: A | Discharge status: Home / Self Care | Payer: Medicaid Other | Attending: Emergency Medicine | Admitting: Emergency Medicine

## 2022-05-25 ENCOUNTER — Other Ambulatory Visit: Payer: Self-pay

## 2022-05-25 ENCOUNTER — Encounter (HOSPITAL_COMMUNITY): Payer: Self-pay

## 2022-05-25 ENCOUNTER — Emergency Department (HOSPITAL_COMMUNITY): Payer: Medicaid Other

## 2022-05-25 DIAGNOSIS — R112 Nausea with vomiting, unspecified: Secondary | ICD-10-CM | POA: Diagnosis not present

## 2022-05-25 DIAGNOSIS — I10 Essential (primary) hypertension: Secondary | ICD-10-CM | POA: Diagnosis not present

## 2022-05-25 DIAGNOSIS — Z79899 Other long term (current) drug therapy: Secondary | ICD-10-CM | POA: Diagnosis not present

## 2022-05-25 DIAGNOSIS — R1084 Generalized abdominal pain: Secondary | ICD-10-CM | POA: Diagnosis not present

## 2022-05-25 DIAGNOSIS — R1010 Upper abdominal pain, unspecified: Secondary | ICD-10-CM

## 2022-05-25 LAB — CBC WITH DIFFERENTIAL/PLATELET
Abs Immature Granulocytes: 0.01 10*3/uL (ref 0.00–0.07)
Basophils Absolute: 0 10*3/uL (ref 0.0–0.1)
Basophils Relative: 1 %
Eosinophils Absolute: 0 10*3/uL (ref 0.0–0.5)
Eosinophils Relative: 1 %
HCT: 37 % (ref 36.0–46.0)
Hemoglobin: 12.6 g/dL (ref 12.0–15.0)
Immature Granulocytes: 0 %
Lymphocytes Relative: 31 %
Lymphs Abs: 1.4 10*3/uL (ref 0.7–4.0)
MCH: 31 pg (ref 26.0–34.0)
MCHC: 34.1 g/dL (ref 30.0–36.0)
MCV: 91.1 fL (ref 80.0–100.0)
Monocytes Absolute: 0.4 10*3/uL (ref 0.1–1.0)
Monocytes Relative: 8 %
Neutro Abs: 2.5 10*3/uL (ref 1.7–7.7)
Neutrophils Relative %: 59 %
Platelets: 185 10*3/uL (ref 150–400)
RBC: 4.06 MIL/uL (ref 3.87–5.11)
RDW: 12.7 % (ref 11.5–15.5)
WBC: 4.3 10*3/uL (ref 4.0–10.5)
nRBC: 0 % (ref 0.0–0.2)

## 2022-05-25 LAB — COMPREHENSIVE METABOLIC PANEL
ALT: 10 U/L (ref 0–44)
AST: 19 U/L (ref 15–41)
Albumin: 4.2 g/dL (ref 3.5–5.0)
Alkaline Phosphatase: 50 U/L (ref 38–126)
Anion gap: 9 (ref 5–15)
BUN: 17 mg/dL (ref 8–23)
CO2: 24 mmol/L (ref 22–32)
Calcium: 10.2 mg/dL (ref 8.9–10.3)
Chloride: 104 mmol/L (ref 98–111)
Creatinine, Ser: 1.31 mg/dL — ABNORMAL HIGH (ref 0.44–1.00)
GFR, Estimated: 46 mL/min — ABNORMAL LOW (ref >60)
Glucose, Bld: 93 mg/dL (ref 70–99)
Potassium: 3.6 mmol/L (ref 3.5–5.1)
Sodium: 137 mmol/L (ref 135–145)
Total Bilirubin: 0.4 mg/dL (ref 0.3–1.2)
Total Protein: 7.3 g/dL (ref 6.5–8.1)

## 2022-05-25 LAB — URINALYSIS, ROUTINE W REFLEX MICROSCOPIC
Bilirubin Urine: NEGATIVE
Glucose, UA: NEGATIVE mg/dL
Hgb urine dipstick: NEGATIVE
Ketones, ur: NEGATIVE mg/dL
Leukocytes,Ua: NEGATIVE
Nitrite: NEGATIVE
Protein, ur: NEGATIVE mg/dL
Specific Gravity, Urine: 1.01 (ref 1.005–1.030)
pH: 6 (ref 5.0–8.0)

## 2022-05-25 LAB — LIPASE, BLOOD: Lipase: 48 U/L (ref 11–51)

## 2022-05-25 MED ORDER — FAMOTIDINE IN NACL 20-0.9 MG/50ML-% IV SOLN
20.0000 mg | Freq: Once | INTRAVENOUS | Status: AC
  Administered 2022-05-25: 20 mg via INTRAVENOUS
  Filled 2022-05-25: qty 50

## 2022-05-25 MED ORDER — PROBIOTIC (LACTOBACILLUS) PO CAPS
1.0000 | ORAL_CAPSULE | Freq: Every day | ORAL | 0 refills | Status: DC
Start: 2022-05-25 — End: 2022-10-23

## 2022-05-25 MED ORDER — SODIUM CHLORIDE 0.9 % IV BOLUS
1000.0000 mL | Freq: Once | INTRAVENOUS | Status: AC
  Administered 2022-05-25: 1000 mL via INTRAVENOUS

## 2022-05-25 MED ORDER — DICYCLOMINE HCL 20 MG PO TABS
20.0000 mg | ORAL_TABLET | Freq: Three times a day (TID) | ORAL | 0 refills | Status: DC | PRN
Start: 2022-05-25 — End: 2022-07-04

## 2022-05-25 MED ORDER — IOHEXOL 300 MG/ML  SOLN
65.0000 mL | Freq: Once | INTRAMUSCULAR | Status: AC | PRN
  Administered 2022-05-25: 65 mL via INTRAVENOUS

## 2022-05-25 MED ORDER — LIDOCAINE VISCOUS HCL 2 % MT SOLN
15.0000 mL | Freq: Once | OROMUCOSAL | Status: AC
  Administered 2022-05-25: 15 mL via ORAL
  Filled 2022-05-25: qty 15

## 2022-05-25 MED ORDER — ONDANSETRON HCL 4 MG PO TABS: 4.0000 mg | ORAL_TABLET | Freq: Four times a day (QID) | ORAL | 0 refills | Status: AC

## 2022-05-25 MED ORDER — ONDANSETRON HCL 4 MG/2ML IJ SOLN
4.0000 mg | Freq: Once | INTRAMUSCULAR | Status: AC
  Administered 2022-05-25: 4 mg via INTRAVENOUS
  Filled 2022-05-25: qty 2

## 2022-05-25 MED ORDER — ALUM & MAG HYDROXIDE-SIMETH 200-200-20 MG/5ML PO SUSP
30.0000 mL | Freq: Once | ORAL | Status: AC
  Administered 2022-05-25: 30 mL via ORAL
  Filled 2022-05-25: qty 30

## 2022-05-25 MED ORDER — MORPHINE SULFATE (PF) 4 MG/ML IV SOLN
4.0000 mg | Freq: Once | INTRAVENOUS | Status: AC
  Administered 2022-05-25: 4 mg via INTRAVENOUS
  Filled 2022-05-25: qty 1

## 2022-05-25 NOTE — ED Provider Triage Note (Signed)
Emergency Medicine Provider Triage Evaluation Note  Heather Gillespie , a 61 y.o. female  was evaluated in triage.  Pt complains of abdominal pain, nausea, and vomiting. She states that same has been ongoing for the past several months but got significantly worse in the past few days. States that she is now unable to tolerate oral intake without nausea or vomiting. Endorses pain throughout her epigastric region consistent with previous flares of pancreatitis. States that she has been having bowel movements and passing gas, however she has also had a few intervals of vomiting stool with the last episode of this being last week.   Review of Systems  Positive:  Negative:   Physical Exam  BP (!) 106/53 (BP Location: Right Arm)   Pulse 60   Temp 98.3 F (36.8 C) (Oral)   Resp 15   SpO2 100%  Gen:   Awake, no distress   Resp:  Normal effort  MSK:   Moves extremities without difficulty  Other:    Medical Decision Making  Medically screening exam initiated at 10:09 AM.  Appropriate orders placed.  Caesar Chestnut was informed that the remainder of the evaluation will be completed by another provider, this initial triage assessment does not replace that evaluation, and the importance of remaining in the ED until their evaluation is complete.     Bud Face, PA-C 05/25/22 1012

## 2022-05-25 NOTE — ED Notes (Signed)
PO challenge successful.

## 2022-05-25 NOTE — ED Provider Notes (Signed)
[facility] DEPART[facility] Provider Note   CSN: [id number] Arrival date & time: 05/25/22  6222     History  No chief complaint on file.   Heather Gillespie is a 61 y.o. female.  Heather Gillespie is a 61 y.o. female with a history of IBS, nausea, vomiting, pancreatitis, rectal bleeding, hypertension, GERD, who presents with complaints of abdominal pain, nausea, and vomiting. She states that same has been ongoing for the past several months but got significantly worse in the past few days. States that she is now unable to tolerate oral intake without nausea or vomiting. Endorses pain throughout her epigastric region consistent with previous flares of pancreatitis. States that she has been having bowel movements and passing gas, however she has also had a few intervals of vomiting stool with the last episode of this being last week.  Patient reports that she was diagnosed with a UTI at urgent care on Friday and has been taking antibiotics as prescribed but now cannot stop vomiting. Hx of prior cholecystectomy.  The history is provided by the patient and a relative.       Home Medications Prior to Admission medications   Medication Sig Start Date End Date Taking? Authorizing Provider  alendronate (FOSAMAX) 70 MG tablet TAKE 1 TABLET BY MOUTH EVERY 7 DAYS. TAKE WITH A FULL GLASS OF WATER ON AN EMPTY STOMACH. 11/06/21   Susy Frizzle, MD  cromolyn (OPTICROM) 4 % ophthalmic solution INSTILL 1 DROP INTO BOTH EYES 4 TIMES A DAY. Strength: 4 % 05/22/22   Susy Frizzle, MD  dexlansoprazole (DEXILANT) 60 MG capsule Take 60 mg by mouth daily.    [provider]  Ergocalciferol (VITAMIN D2) 2000 units TABS Take 1 tablet by mouth daily.    [provider]  fexofenadine (ALLEGRA) 180 MG tablet Take 1 tablet (180 mg total) by mouth daily. 10/04/21   Susy Frizzle, MD  fluticasone (FLONASE) 50 MCG/ACT nasal spray SPRAY 2 SPRAYS INTO EACH NOSTRIL EVERY  DAY Patient taking differently: Place 2 sprays into both nostrils daily. 11/28/21   Susy Frizzle, MD  gabapentin (NEURONTIN) 300 MG capsule Take 1 capsule (300 mg total) by mouth 3 (three) times daily. 05/08/22   Susy Frizzle, MD  hydrochlorothiazide (HYDRODIURIL) 25 MG tablet Take 1 tablet (25 mg total) by mouth daily. 09/10/21   Susy Frizzle, MD  ibuprofen (ADVIL) 800 MG tablet Take 1 tablet (800 mg total) by mouth 3 (three) times daily. 03/28/22   Raspet, Derry Skill, PA-C  losartan (COZAAR) 100 MG tablet Take 1 tablet (100 mg total) by mouth daily. 08/30/21   Susy Frizzle, MD  PATADAY 0.7 % SOLN INSTILL 1 DROP INTO BOTH EYES EVERY DAY 10/15/21   Susy Frizzle, MD  sulfamethoxazole-trimethoprim (BACTRIM DS) 800-160 MG tablet Take 1 tablet by mouth 2 (two) times daily for 7 days. 05/23/22 05/30/22  Raspet, Derry Skill, PA-C      Allergies    Hydroxyzine, Lotronex [alosetron hcl], Other, Hydroxyzine pamoate, and Morphine sulfate    Review of Systems   Review of Systems  Constitutional:  Negative for chills and fever.  HENT: Negative.    Respiratory:  Negative for cough and shortness of breath.   Cardiovascular:  Negative for chest pain.  Gastrointestinal:  Positive for abdominal pain, nausea and vomiting. Negative for blood in stool, constipation and diarrhea.  Genitourinary:  Negative for dysuria and frequency.    Physical Exam Updated Vital Signs  BP 116/61   Pulse 62   Temp 98.2 F (36.8 C) (Oral)   Resp 16   SpO2 98%  Physical Exam Vitals and nursing note reviewed.  Constitutional:      General: She is not in acute distress.    Appearance: Normal appearance. She is well-developed. She is not diaphoretic.     Comments: Chronically ill appearing, but in no acute distress  HENT:     Head: Normocephalic and atraumatic.     Mouth/Throat:     Mouth: Mucous membranes are moist.     Pharynx: Oropharynx is clear.  Eyes:     General:        Right eye: No discharge.         Left eye: No discharge.     Pupils: Pupils are equal, round, and reactive to light.  Cardiovascular:     Rate and Rhythm: Normal rate and regular rhythm.     Pulses: Normal pulses.     Heart sounds: Normal heart sounds.  Pulmonary:     Effort: Pulmonary effort is normal. No respiratory distress.     Breath sounds: Normal breath sounds. No wheezing or rales.     Comments: Respirations equal and unlabored, patient able to speak in full sentences, lungs clear to auscultation bilaterally  Abdominal:     General: Bowel sounds are normal. There is no distension.     Palpations: Abdomen is soft. There is no mass.     Tenderness: There is abdominal tenderness. There is no guarding.     Comments: Abdomen soft, nondistended, mild generalized tenderness, worst at the epigastrium, no guarding or peritoneal signs  Musculoskeletal:        General: No deformity.     Cervical back: Neck supple.  Skin:    General: Skin is warm and dry.     Capillary Refill: Capillary refill takes less than 2 seconds.  Neurological:     Mental Status: She is alert and oriented to person, place, and time.     Coordination: Coordination normal.     Comments: Speech is clear, able to follow commands CN III-XII intact Normal strength in upper and lower extremities bilaterally including dorsiflexion and plantar flexion, strong and equal grip strength Sensation normal to light and sharp touch Moves extremities without ataxia, coordination intact  Psychiatric:        Mood and Affect: Mood normal.        Behavior: Behavior normal.     ED Results / Procedures / Treatments   Labs (all labs ordered are listed, but only abnormal results are displayed) Labs Reviewed  COMPREHENSIVE METABOLIC PANEL - Abnormal; Notable for the following components:      Result Value   Creatinine, Ser 1.31 (*)    GFR, Estimated 46 (*)    All other components within normal limits  LIPASE, BLOOD  CBC WITH DIFFERENTIAL/PLATELET  URINALYSIS,  ROUTINE W REFLEX MICROSCOPIC    EKG None  Radiology CT ABDOMEN PELVIS W CONTRAST  Result Date: 05/25/2022 CLINICAL DATA:  Abdominal pain, nausea and vomiting. EXAM: CT ABDOMEN AND PELVIS WITH CONTRAST TECHNIQUE: Multidetector CT imaging of the abdomen and pelvis was performed using the standard protocol following bolus administration of intravenous contrast. RADIATION DOSE REDUCTION: This exam was performed according to the departmental dose-optimization program which includes automated exposure control, adjustment of the mA and/or kV according to patient size and/or use of iterative reconstruction technique. CONTRAST:  46m OMNIPAQUE IOHEXOL 300 MG/ML  SOLN COMPARISON:  04/16/2022  FINDINGS: Lower chest: No acute abnormality. Hepatobiliary: Hemangioma within the central aspect of the liver is again noted measuring 1.6 x 1.5 cm likely flash fill hemangioma within the lateral segment of left lobe of liver measures 8 mm. No suspicious liver lesions identified. Cholecystectomy. CBD is upper limits of normal in caliber measuring 6 mm. Pancreas: Unremarkable. No pancreatic ductal dilatation or surrounding inflammatory changes. Spleen: Normal in size without focal abnormality. Adrenals/Urinary Tract: Normal adrenal glands. Bosniak class 1 cyst arises off the lateral cortex of the left kidney measuring 2.1 cm. Bosniak class 2 cyst arises off the posterior cortex of the right kidney measuring 1 cm. No follow-up imaging recommended. No signs of nephrolithiasis or hydronephrosis. Urinary bladder is unremarkable. Stomach/Bowel: Stomach appears normal. The appendix is visualized containing several appendicular lists, image 33/6. No appendiceal thickening, inflammation or distension. No bowel wall thickening, inflammation or distension. Vascular/Lymphatic: No significant vascular findings are present. No enlarged abdominal or pelvic lymph nodes. Reproductive: Uterus appears normal. Simple appearing cyst within the right  adnexa measures 4.6 by 4.1 cm. Normal left adnexa. Other: No free fluid or fluid collections. Musculoskeletal: No acute or significant osseous findings. IMPRESSION: 1. No acute findings within the abdomen or pelvis. 2. Simple appearing right adnexal cyst measures up to 4.6 cm. Recommend follow-up US in 6-12 months. Note: This recommendation does not apply to premenarchal patients and to those with increased risk (genetic, family history, elevated tumor markers or other high-risk factors) of ovarian cancer. Reference: JACR 2020 Feb; 17(2):248-254 3. Liver hemangiomas. Electronically Signed   By: Kerby Moors M.D.   On: 05/25/2022 13:15    Procedures Procedures    Medications Ordered in ED Medications  iohexol (OMNIPAQUE) 300 MG/ML solution 65 mL (65 mLs Intravenous Contrast Given 05/25/22 1300)  sodium chloride 0.9 % bolus 1,000 mL (0 mLs Intravenous Stopped 05/25/22 1847)  ondansetron (ZOFRAN) injection 4 mg (4 mg Intravenous Given 05/25/22 1722)  morphine (PF) 4 MG/ML injection 4 mg (4 mg Intravenous Given 05/25/22 1722)  famotidine (PEPCID) IVPB 20 mg premix (0 mg Intravenous Stopped 05/25/22 1847)  alum & mag hydroxide-simeth (MAALOX/MYLANTA) 200-200-20 MG/5ML suspension 30 mL (30 mLs Oral Given 05/25/22 1722)    And  lidocaine (XYLOCAINE) 2 % viscous mouth solution 15 mL (15 mLs Oral Given 05/25/22 1722)    ED Course/ Medical Decision Making/ A&P                           Medical Decision Making Risk OTC drugs. Prescription drug management.   61 y.o. female presents to the ED with complaints of vomiting and abdominal pain, this involves an extensive number of treatment options, and is a complaint that carries with it a high risk of complications and morbidity.  The differential diagnosis includes gastritis, gastroenteritis, PUD, pancreatitis, obstruction, perforation, appendicitis, UTI, pyelonephritis, kidney stone, diverticulitis   On arrival pt is nontoxic, vitals WNL.   Additional  history obtained from family at bedside. Previous records obtained and reviewed including UC notes and PCP notes, prior labs and imaging  I ordered medication including morphine, zofran, pepcid, GI cocktail, and fluid bolus  Lab Tests:  I Ordered, reviewed, and interpreted labs, which included: no leukocytosis, normal hgb, Cr mildly increased form prior fluids given, no other electrolyte derangements, normal liver function and lipase, UA no longer with signs of infection after ABX  Imaging Studies ordered:  I ordered imaging studies which included CT abd/pel w/ contrast, I independently visualized  and interpreted imaging which showed no acute findings, adnexal cyst noted incidentally, unlikely related to pain, presentation not suggestive of torsion and pt post-menopausal  ED Course:   This evaluation has overall been reassuring today.  On reevaluation abdominal pain has resolved and patient is tolerating p.o. fluids.  Suspect gastritis or potential side effects from antibiotics.  UTI has resolved.  Imaging is reassuring.  I discussed all results with patient and family members at bedside.  At this time I feel she is stable for discharge home with continued supportive care and close PCP follow-up.  Family is also working on organizing urology follow-up given recurrent UTIs.  Urology office and was initially given encounter today.  Return precautions discussed.  At this time there does not appear to be any evidence of an acute emergency medical condition requiring further emergent evaluation and the patient appears stable for discharge with appropriate outpatient follow up. Diagnosis and return precautions discussed with patient who verbalizes understanding and is agreeable to discharge.    Portions of this note were generated with [employer]. Dictation errors may occur despite best attempts at proofreading.         Final Clinical Impression(s) / ED Diagnoses Final diagnoses:   Pain of upper abdomen  Nausea and vomiting, unspecified vomiting type    Rx / DC Orders ED Discharge Orders          Ordered    ondansetron (ZOFRAN) 4 MG tablet  Every 6 hours        05/25/22 2007    dicyclomine (BENTYL) 20 MG tablet  3 times daily PRN        05/25/22 2007    Probiotic, Lactobacillus, CAPS  Daily        05/25/22 2007              Jacqlyn Larsen, [place] 06/13/22 [place], [place], DO 06/16/22 [phone]

## 2022-05-25 NOTE — Discharge Instructions (Addendum)
Labs and CT scan were overall very reassuring today.  I recommend that you follow-up with your GI doctor, you can call your urologist phone number on your paperwork today to help aid in getting follow-up appointment but her urine does not look infected and she does not have any blood in her urine today which is very reassuring.  You can use Zofran as needed for nausea or vomiting and can take Bentyl as needed for abdominal pain.  Please take probiotic supplement once daily to help restore good bacteria in your gut after being on antibiotics  Return for new or worsening abdominal pain, fevers, vomiting or other new or concerning symptoms.

## 2022-05-25 NOTE — ED Triage Notes (Signed)
Patient diagnosed with UTI on Friday at [facility] and taking antibiotics as prescribed. Now vomiting and they wanted her admitted on Friday for concerns of pancreatitis.

## 2022-05-26 ENCOUNTER — Telehealth: Payer: Self-pay

## 2022-05-26 DIAGNOSIS — M810 Age-related osteoporosis without current pathological fracture: Secondary | ICD-10-CM

## 2022-05-26 MED ORDER — ALENDRONATE SODIUM 70 MG PO TABS: ORAL_TABLET | ORAL | 11 refills | Status: DC

## 2022-05-26 NOTE — Telephone Encounter (Signed)
Pharmacy faxed a refill request for alendronate (FOSAMAX) 70 MG tablet [[id number]]    Order Details Dose, Route, Frequency: As Directed  Dispense Quantity: 4 tablet Refills: 11        Sig: TAKE 1 TABLET BY MOUTH EVERY 7 DAYS. TAKE WITH A FULL GLASS OF WATER ON AN EMPTY STOMACH.       Start Date: 11/06/21 End Date: --  Written Date: 11/06/21 Expiration Date: 11/06/22  Medication Notes:   Otilio Connors, CPhT  -- 04/16/2022

## 2022-05-26 NOTE — Telephone Encounter (Signed)
Pt's brother, Ulice Dash, who is POA called, stating pt was seen in ER on Sunday. Pt was advised to follow up with you. Pt has a visit already scheduled for 06/05/2022. Is it okay for her to f/u then or come in sooner?  Pt is also to f/u with GI and Urology.

## 2022-05-27 ENCOUNTER — Telehealth: Payer: Self-pay

## 2022-05-27 NOTE — Telephone Encounter (Signed)
Appointment scheduled with [facility] for 06/09/2022 @ 9:15 am. Pt advised and verbalized understanding. Pt gave verbal permission to advise brother, Ulice Dash of appt date and time. Mjp,lpn

## 2022-06-02 ENCOUNTER — Other Ambulatory Visit: Payer: Self-pay | Admitting: Family Medicine

## 2022-06-02 DIAGNOSIS — Z1231 Encounter for screening mammogram for malignant neoplasm of breast: Secondary | ICD-10-CM

## 2022-06-05 ENCOUNTER — Other Ambulatory Visit: Payer: Self-pay

## 2022-06-05 ENCOUNTER — Encounter: Payer: Medicaid Other | Admitting: Family Medicine

## 2022-06-05 ENCOUNTER — Other Ambulatory Visit (HOSPITAL_BASED_OUTPATIENT_CLINIC_OR_DEPARTMENT_OTHER): Payer: Self-pay

## 2022-06-05 ENCOUNTER — Emergency Department (HOSPITAL_BASED_OUTPATIENT_CLINIC_OR_DEPARTMENT_OTHER)
Admission: EM | Admit: 2022-06-05 | Discharge: 2022-06-05 | Disposition: A | Discharge status: Home / Self Care | Payer: PRIVATE HEALTH INSURANCE | Attending: Emergency Medicine | Admitting: Emergency Medicine

## 2022-06-05 DIAGNOSIS — Z79899 Other long term (current) drug therapy: Secondary | ICD-10-CM | POA: Insufficient documentation

## 2022-06-05 DIAGNOSIS — E876 Hypokalemia: Secondary | ICD-10-CM | POA: Diagnosis not present

## 2022-06-05 DIAGNOSIS — R310 Gross hematuria: Secondary | ICD-10-CM | POA: Diagnosis not present

## 2022-06-05 DIAGNOSIS — I10 Essential (primary) hypertension: Secondary | ICD-10-CM | POA: Diagnosis not present

## 2022-06-05 DIAGNOSIS — R1084 Generalized abdominal pain: Secondary | ICD-10-CM

## 2022-06-05 DIAGNOSIS — R1013 Epigastric pain: Secondary | ICD-10-CM | POA: Diagnosis present

## 2022-06-05 LAB — URINALYSIS, ROUTINE W REFLEX MICROSCOPIC
Bilirubin Urine: NEGATIVE
Glucose, UA: NEGATIVE mg/dL
Ketones, ur: NEGATIVE mg/dL
Nitrite: NEGATIVE
Protein, ur: 30 mg/dL — AB
Specific Gravity, Urine: 1.022 (ref 1.005–1.030)
pH: 6.5 (ref 5.0–8.0)

## 2022-06-05 LAB — OCCULT BLOOD X 1 CARD TO LAB, STOOL: Fecal Occult Bld: NEGATIVE

## 2022-06-05 LAB — COMPREHENSIVE METABOLIC PANEL
ALT: 8 U/L (ref 0–44)
AST: 16 U/L (ref 15–41)
Albumin: 4.7 g/dL (ref 3.5–5.0)
Alkaline Phosphatase: 51 U/L (ref 38–126)
Anion gap: 10 (ref 5–15)
BUN: 18 mg/dL (ref 8–23)
CO2: 28 mmol/L (ref 22–32)
Calcium: 9.5 mg/dL (ref 8.9–10.3)
Chloride: 100 mmol/L (ref 98–111)
Creatinine, Ser: 0.9 mg/dL (ref 0.44–1.00)
GFR, Estimated: >60 mL/min (ref >60)
Glucose, Bld: 93 mg/dL (ref 70–99)
Potassium: 3.2 mmol/L — ABNORMAL LOW (ref 3.5–5.1)
Sodium: 138 mmol/L (ref 135–145)
Total Bilirubin: 0.4 mg/dL (ref 0.3–1.2)
Total Protein: 7.6 g/dL (ref 6.5–8.1)

## 2022-06-05 LAB — LIPASE, BLOOD: Lipase: 77 U/L — ABNORMAL HIGH (ref 11–51)

## 2022-06-05 LAB — CBC
HCT: 34.3 % — ABNORMAL LOW (ref 36.0–46.0)
Hemoglobin: 11.9 g/dL — ABNORMAL LOW (ref 12.0–15.0)
MCH: 30.4 pg (ref 26.0–34.0)
MCHC: 34.7 g/dL (ref 30.0–36.0)
MCV: 87.7 fL (ref 80.0–100.0)
Platelets: 187 10*3/uL (ref 150–400)
RBC: 3.91 MIL/uL (ref 3.87–5.11)
RDW: 12.3 % (ref 11.5–15.5)
WBC: 3.4 10*3/uL — ABNORMAL LOW (ref 4.0–10.5)
nRBC: 0 % (ref 0.0–0.2)

## 2022-06-05 MED ORDER — SODIUM CHLORIDE 0.9 % IV SOLN
1.0000 g | Freq: Once | INTRAVENOUS | Status: AC
  Administered 2022-06-05: 1 g via INTRAVENOUS
  Filled 2022-06-05: qty 10

## 2022-06-05 MED ORDER — MORPHINE SULFATE (PF) 4 MG/ML IV SOLN
4.0000 mg | Freq: Once | INTRAVENOUS | Status: AC
  Administered 2022-06-05: 4 mg via INTRAVENOUS
  Filled 2022-06-05: qty 1

## 2022-06-05 MED ORDER — SODIUM CHLORIDE 0.9 % IV SOLN
12.5000 mg | Freq: Four times a day (QID) | INTRAVENOUS | Status: DC | PRN
  Administered 2022-06-05: 12.5 mg via INTRAVENOUS
  Filled 2022-06-05: qty 0.5

## 2022-06-05 MED ORDER — PROMETHAZINE HCL 25 MG/ML IJ SOLN
INTRAMUSCULAR | Status: AC
  Filled 2022-06-05: qty 1

## 2022-06-05 MED ORDER — FLUCONAZOLE 150 MG PO TABS
150.0000 mg | ORAL_TABLET | Freq: Every day | ORAL | 0 refills | Status: DC
  Filled 2022-06-05: qty 1, 1d supply, fill #0

## 2022-06-05 MED ORDER — POTASSIUM CHLORIDE CRYS ER 20 MEQ PO TBCR
40.0000 meq | EXTENDED_RELEASE_TABLET | Freq: Once | ORAL | Status: AC
  Administered 2022-06-05: 40 meq via ORAL
  Filled 2022-06-05: qty 2

## 2022-06-05 MED ORDER — SODIUM CHLORIDE 0.9 % IV BOLUS
1000.0000 mL | Freq: Once | INTRAVENOUS | Status: AC
  Administered 2022-06-05: 1000 mL via INTRAVENOUS

## 2022-06-05 MED ORDER — POTASSIUM CHLORIDE CRYS ER 20 MEQ PO TBCR: 20.0000 meq | EXTENDED_RELEASE_TABLET | Freq: Every day | ORAL | 0 refills | Status: DC

## 2022-06-05 MED ORDER — SODIUM CHLORIDE 0.9 % IV SOLN: INTRAVENOUS | Status: DC | PRN

## 2022-06-05 MED ORDER — PROMETHAZINE HCL 25 MG PO TABS
25.0000 mg | ORAL_TABLET | Freq: Four times a day (QID) | ORAL | 0 refills | Status: AC | PRN
  Filled 2022-06-05: qty 10, 3d supply, fill #0

## 2022-06-05 NOTE — ED Provider Notes (Signed)
[facility] Provider Note   CSN: [id number] Arrival date & time: 06/05/22  1145     History  No chief complaint on file.   Heather Gillespie is a 61 y.o. female.  The history is provided by the patient, a relative and medical records. No language interpreter was used.     61 year old female significant history of IBS, GERD, hypertension, hiatal hernia presenting with complaint of abdominal pain.  Patient is a poor historian.  Patient report for the past several months she has had abdominal pain.  She described pain as a burning sensation in her epigastric region, radiates down across abdomen.  She also endorsed feeling nauseous, having trouble keeping any food down.  She endorsed noticing blood per rectum described more as a bright red blood when she have bowel movement.  She also noticed blood in the urine as well with burning sensation when she urinates.  She feels weak and lightheadedness.  She does not endorse any fever, chest pain or shortness of breath.  She endorsed nausea and occasional vomiting up food.  She denies any rectal pain.  She mention she is scheduled to have a endoscopy/colonoscopy next week.  She is here today requesting for some help with her urinary discomfort.  States she has been on multiple antibiotic for UTI last antibiotic use was last week and felt that his symptoms usually more controlled while she is on antibiotic.  States she last took Keflex.  She is not any blood thinner medication.  She denies alcohol use.  She does report family history of cancer.  Home Medications Prior to Admission medications   Medication Sig Start Date End Date Taking? Authorizing Provider  alendronate (FOSAMAX) 70 MG tablet TAKE 1 TABLET BY MOUTH EVERY 7 DAYS. TAKE WITH A FULL GLASS OF WATER ON AN EMPTY STOMACH. 05/26/22   Susy Frizzle, MD  cromolyn (OPTICROM) 4 % ophthalmic solution INSTILL 1 DROP INTO BOTH EYES 4 TIMES A DAY. Strength: 4 % 05/22/22    Susy Frizzle, MD  dexlansoprazole (DEXILANT) 60 MG capsule Take 60 mg by mouth daily.    [provider]  dicyclomine (BENTYL) 20 MG tablet Take 1 tablet (20 mg total) by mouth 3 (three) times daily as needed for spasms. 05/25/22   Jacqlyn Larsen, PA-C  Ergocalciferol (VITAMIN D2) 2000 units TABS Take 1 tablet by mouth daily.    [provider]  fexofenadine (ALLEGRA) 180 MG tablet Take 1 tablet (180 mg total) by mouth daily. 10/04/21   Susy Frizzle, MD  fluticasone (FLONASE) 50 MCG/ACT nasal spray SPRAY 2 SPRAYS INTO EACH NOSTRIL EVERY DAY Patient taking differently: Place 2 sprays into both nostrils daily. 11/28/21   Susy Frizzle, MD  gabapentin (NEURONTIN) 300 MG capsule Take 1 capsule (300 mg total) by mouth 3 (three) times daily. 05/08/22   Susy Frizzle, MD  hydrochlorothiazide (HYDRODIURIL) 25 MG tablet Take 1 tablet (25 mg total) by mouth daily. 09/10/21   Susy Frizzle, MD  ibuprofen (ADVIL) 800 MG tablet Take 1 tablet (800 mg total) by mouth 3 (three) times daily. 03/28/22   Raspet, Derry Skill, PA-C  losartan (COZAAR) 100 MG tablet Take 1 tablet (100 mg total) by mouth daily. 08/30/21   Susy Frizzle, MD  ondansetron (ZOFRAN) 4 MG tablet Take 1 tablet (4 mg total) by mouth every 6 (six) hours. 05/25/22   Benedetto Goad N, PA-C  PATADAY 0.7 % SOLN INSTILL 1 DROP INTO BOTH  EYES EVERY DAY 10/15/21   Susy Frizzle, MD  Probiotic, Lactobacillus, CAPS Take 1 capsule by mouth daily. 05/25/22   Jacqlyn Larsen, PA-C      Allergies    Hydroxyzine, Lotronex [alosetron hcl], Other, Hydroxyzine pamoate, and Morphine sulfate    Review of Systems   Review of Systems  All other systems reviewed and are negative.   Physical Exam Updated Vital Signs BP 138/71 (BP Location: Left Arm)   Pulse 71   Temp 98.4 F (36.9 C) (Oral)   Resp 20   Ht '5\' 2"'$  (1.575 m)   Wt 50.8 kg   SpO2 100%   BMI 20.49 kg/m  Physical Exam Vitals and nursing note reviewed.   Constitutional:      General: She is not in acute distress.    Appearance: She is well-developed.  HENT:     Head: Atraumatic.  Eyes:     Conjunctiva/sclera: Conjunctivae normal.  Cardiovascular:     Rate and Rhythm: Normal rate and regular rhythm.     Pulses: Normal pulses.     Heart sounds: Normal heart sounds.  Pulmonary:     Effort: Pulmonary effort is normal.  Abdominal:     Palpations: Abdomen is soft.     Tenderness: There is abdominal tenderness (Diffuse tenderness without focal point tenderness.  No guarding or rebound tenderness.).  Genitourinary:    Comments: Chaperone present during exam.  No thrombosed hemorrhoid, normal rectal tone, no obvious mass, no significant stool in rectal vault.  No blood on glove. Musculoskeletal:     Cervical back: Neck supple.  Skin:    Findings: No rash.  Neurological:     Mental Status: She is alert.  Psychiatric:        Mood and Affect: Mood normal.     ED Results / Procedures / Treatments   Labs (all labs ordered are listed, but only abnormal results are displayed) Labs Reviewed  LIPASE, BLOOD - Abnormal; Notable for the following components:      Result Value   Lipase 77 (*)    All other components within normal limits  COMPREHENSIVE METABOLIC PANEL - Abnormal; Notable for the following components:   Potassium 3.2 (*)    All other components within normal limits  CBC - Abnormal; Notable for the following components:   WBC 3.4 (*)    Hemoglobin 11.9 (*)    HCT 34.3 (*)    All other components within normal limits  URINALYSIS, ROUTINE W REFLEX MICROSCOPIC - Abnormal; Notable for the following components:   Color, Urine ORANGE (*)    APPearance HAZY (*)    Hgb urine dipstick LARGE (*)    Protein, ur 30 (*)    Leukocytes,Ua TRACE (*)    Bacteria, UA MANY (*)    Crystals PRESENT (*)    All other components within normal limits  URINE CULTURE  OCCULT BLOOD X 1 CARD TO LAB, STOOL    EKG None  Radiology No results  found.  Procedures Procedures    Medications Ordered in ED Medications  sodium chloride 0.9 % bolus 1,000 mL (has no administration in time range)  promethazine (PHENERGAN) 12.5 mg in sodium chloride 0.9 % 50 mL IVPB (has no administration in time range)  morphine (PF) 4 MG/ML injection 4 mg (has no administration in time range)    ED Course/ Medical Decision Making/ A&P  Medical Decision Making Amount and/or Complexity of Data Reviewed Labs: ordered.  Risk Prescription drug management.   BP 138/71 (BP Location: Left Arm)   Pulse 71   Temp 98.4 F (36.9 C) (Oral)   Resp 20   Ht '5\' 2"'$  (1.575 m)   Wt 50.8 kg   SpO2 100%   BMI 20.49 kg/m   12:57 PM This is a 61 year old female with significant history of GERD, IBS, who presents complaining of recurrent abdominal pain, urinary discomfort, noticing bright red blood per rectum as well as blood in her urine which has been an ongoing issue for the past several months.  She endorsed feeling lightheadedness and weakness with the symptoms.  She report having recurrent urinary tract infection which she has been taking antibiotics.    On exam, patient is resting comfortably appears to be in no acute discomfort.  She does have diffuse abdominal tenderness without focal point tenderness.  Rectal exam performed under chaperone without any frank bleeding.  No evidence of perirectal abscess, thrombosed hemorrhoid, rectal tear, or any obvious mass.  I reviewed patient's previous abdominal pelvis CT scan 11 days ago when she was seen in the ED for similar presentation.  CT scan at that time shows no acute finding of the abdomen pelvis.  Specifically no evidence of pancreatitis.  Evidence of ovarian cyst were noted.  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  She has had her gallbladder removed.  2:22 PM Labs  obtained independently viewed interpreted by me.  Fecal occult blood test is negative.  Hemoglobin is 11.9 essentially normal.  She does have mildly elevated lipase of 77.  Urinalysis shows large hemoglobin and urine dipsticks, trace leukocytes, and many bacteria with evidence of crystals and budding yeast.  Since patient has had urinary discomfort, I have ordered a urine culture as well as given patient IV Rocephin.  3:42 PM Patient report improvement of symptoms after receiving IV fluid and medication.  Nausea improved.  I discussed finding with patient and encouraged patient to follow-up outpatient for further evaluation and management of her hematuria.  She does have a cystoscopy procedure scheduled in the next couple days.  I encourage patient to return if her symptoms worsen.  At this time, we will hold off on repeat CT scan.  Patient request for antibiotic but in the setting of multiple antibiotics for UTI, I recommend to wait for urine culture result before starting a new antibiotic.   This patient presents to the ED for concern of abd pain, this involves an extensive number of treatment options, and is a complaint that carries with it a high risk of complications and morbidity.  The differential diagnosis includes UTI, diverticulitis, colitis, appendicitis, GERD, gastritis, ovarian cyst, kidney stones  Co morbidities that complicate the patient evaluation IBS  GERD Additional history obtained:  Additional history obtained from family members External records from outside source obtained and reviewed including EMR including prior labs and imaging  Lab Tests:  I Ordered, and personally interpreted labs.  The pertinent results include:  as above  Cardiac Monitoring:  The patient was maintained on a cardiac monitor.  I personally viewed and interpreted the cardiac monitored which showed an underlying rhythm of: NSR  Medicines ordered and prescription drug management:  I ordered  medication including IVF  for dehydration Reevaluation of the patient after these medicines showed  that the patient improved I have reviewed the patients home medicines and have made adjustments as needed  Test Considered: as above  Critical Interventions: IVF  Opiate medication  Antinausea meds   Problem List / ED Course: abd pain  Nausea  Hematuria  hematochezia  Reevaluation:  After the interventions noted above, I reevaluated the patient and found that they have :improved  Social Determinants of Health: none  Dispostion:  After consideration of the diagnostic results and the patients response to treatment, I feel that the patent would benefit from outpt f/u.         Final Clinical Impression(s) / ED Diagnoses Final diagnoses:  Gross hematuria  Generalized abdominal pain  Hypokalemia    Rx / DC Orders ED Discharge Orders          Ordered    promethazine (PHENERGAN) 25 MG tablet  Every 6 hours PRN        06/05/22 1545    potassium chloride SA (KLOR-CON M) 20 MEQ tablet  Daily        06/05/22 1545    fluconazole (DIFLUCAN) 150 MG tablet  Daily        06/05/22 1545              Domenic Moras, PA-C 06/05/22 1546    Dorie Rank, MD 06/06/22 [phone]

## 2022-06-05 NOTE — Discharge Instructions (Addendum)
You have been evaluated for your symptoms.  It appears you do have blood in your urine but no blood in your stool.  Please follow-up with your urologist in the next several days for cystoscopy as previously scheduled.  A urine culture have been sent, have your urologist follow-up on the result to see whether you need any additional antibiotic to treat for potential urinary tract infection.  Your potassium level is low, please take supplementation as prescribed and have your level rechecked.  You may take Phenergan as needed for nausea.  Stay hydrated.  Return if you have any concern.  Happy early birthday.

## 2022-06-06 ENCOUNTER — Other Ambulatory Visit (HOSPITAL_BASED_OUTPATIENT_CLINIC_OR_DEPARTMENT_OTHER): Payer: Self-pay

## 2022-06-08 LAB — URINE CULTURE: Culture: >=100000 — AB

## 2022-06-09 ENCOUNTER — Other Ambulatory Visit: Payer: Self-pay

## 2022-06-09 ENCOUNTER — Telehealth (HOSPITAL_BASED_OUTPATIENT_CLINIC_OR_DEPARTMENT_OTHER): Payer: Self-pay | Admitting: *Deleted

## 2022-06-09 NOTE — Telephone Encounter (Signed)
Pharmacy faxed a refill request for losartan (COZAAR) 100 MG tablet [[id number]]    Order Details Dose: 100 mg Route: Oral Frequency: Daily  Dispense Quantity: 90 tablet Refills: 3        Sig: Take 1 tablet (100 mg total) by mouth daily.       Start Date: 08/30/21 End Date: --  Written Date: 08/30/21 Expiration Date: 08/30/22  Original Order:  losartan (COZAAR) 100 MG tablet [[id number]]

## 2022-06-09 NOTE — Telephone Encounter (Signed)
Post ED Visit - Positive Culture Follow-up  Culture report reviewed by antimicrobial stewardship pharmacist: [place] Team '[]'$  Elenor Quinones, Pharm.D. '[]'$  Heide Guile, Pharm.D., BCPS AQ-ID '[]'$  Parks Neptune, Pharm.D., BCPS '[]'$  Alycia Rossetti, Pharm.D., BCPS '[]'$  [place], [place].D., BCPS, AAHIVP '[]'$  Legrand Como, Pharm.D., BCPS, AAHIVP '[]'$  Salome Arnt, PharmD, BCPS '[]'$  Johnnette Gourd, PharmD, BCPS '[]'$  Hughes Better, PharmD, BCPS '[]'$  Leeroy Cha, PharmD '[]'$  Laqueta Linden, PharmD, BCPS '[]'$  Albertina Parr, PharmD  [place] Team '[]'$  Leodis Sias, PharmD '[]'$  Lindell Spar, PharmD '[]'$  Royetta Asal, PharmD '[]'$  Graylin Shiver, Rph '[]'$  Rema Fendt) Glennon Mac, PharmD '[]'$  Arlyn Dunning, PharmD '[]'$  Netta Cedars, PharmD '[]'$  Dia Sitter, PharmD '[]'$  Leone Haven, PharmD '[]'$  Gretta Arab, PharmD '[]'$  Theodis Shove, PharmD '[]'$  Peggyann Juba, PharmD '[]'$  Reuel Boom, PharmD   Positive urine culture Treated with Fluconazole, organism sensitive to the same and no further patient follow-up is required at this time. Terrall Laity, PharmD  Harlon Flor Talley 06/09/2022, 12:50 PM

## 2022-06-10 MED ORDER — LOSARTAN POTASSIUM 100 MG PO TABS: 100.0000 mg | ORAL_TABLET | Freq: Every day | ORAL | 0 refills | Status: DC

## 2022-06-10 NOTE — Telephone Encounter (Signed)
Requested Prescriptions  Pending Prescriptions Disp Refills  . losartan (COZAAR) 100 MG tablet 90 tablet 0    Sig: Take 1 tablet (100 mg total) by mouth daily.     Cardiovascular:  Angiotensin Receptor Blockers Failed - 06/09/2022  3:12 PM      Failed - K in normal range and within 180 days    Potassium  Date Value Ref Range Status  06/05/2022 3.2 (L) 3.5 - 5.1 mmol/L Final         Failed - Valid encounter within last 6 months    Recent Outpatient Visits          6 months ago Osteoporosis, unspecified osteoporosis type, unspecified pathological fracture presence   [place] Pickard, Cammie Mcgee, MD   11 months ago Gross hematuria   [place] [facility], Cammie Mcgee, MD   1 year ago General medical exam   [place] Susy Frizzle, MD   2 years ago General medical exam   [place] Susy Frizzle, MD   3 years ago General medical exam   [place] Susy Frizzle, MD      Future Appointments            In 6 days Susy Frizzle, MD [place]           Passed - Cr in normal range and within 180 days    Creat  Date Value Ref Range Status  06/03/2021 0.76 0.50 - 1.05 mg/dL Final   Creatinine, Ser  Date Value Ref Range Status  06/05/2022 0.90 0.44 - 1.00 mg/dL Final         Passed - Patient is not pregnant      Passed - Last BP in normal range    BP Readings from Last 1 Encounters:  06/05/22 119/66

## 2022-06-12 ENCOUNTER — Telehealth: Payer: Self-pay

## 2022-06-12 NOTE — Telephone Encounter (Signed)
Pharmacy faxed a refill request for cromolyn (OPTICROM) 4 % ophthalmic solution [[id number]]    Order Details Dose, Route, Frequency: As Directed  Dispense Quantity: 10 mL Refills: 2        Sig:   INSTILL 1 DROP INTO BOTH EYES 4 TIMES A DAY.  Strength: 4 %        Start Date: 05/22/22 End Date: --  Written Date: 05/22/22 Expiration Date: 05/22/23  Original Order:  cromolyn (OPTICROM) 4 % ophthalmic solution [[id number]]

## 2022-06-16 ENCOUNTER — Ambulatory Visit (INDEPENDENT_AMBULATORY_CARE_PROVIDER_SITE_OTHER): Payer: Medicaid Other | Admitting: Family Medicine

## 2022-06-16 VITALS — BP 118/68 | HR 76 | Ht 62.0 in | Wt 110.6 lb

## 2022-06-16 DIAGNOSIS — Z124 Encounter for screening for malignant neoplasm of cervix: Secondary | ICD-10-CM | POA: Diagnosis not present

## 2022-06-16 DIAGNOSIS — R310 Gross hematuria: Secondary | ICD-10-CM

## 2022-06-16 DIAGNOSIS — N838 Other noninflammatory disorders of ovary, fallopian tube and broad ligament: Secondary | ICD-10-CM

## 2022-06-16 DIAGNOSIS — Z0000 Encounter for general adult medical examination without abnormal findings: Secondary | ICD-10-CM

## 2022-06-16 DIAGNOSIS — I10 Essential (primary) hypertension: Secondary | ICD-10-CM | POA: Diagnosis not present

## 2022-06-16 LAB — URINALYSIS, ROUTINE W REFLEX MICROSCOPIC
Glucose, UA: NEGATIVE
Hyaline Cast: NONE SEEN /LPF
Ketones, ur: NEGATIVE
Nitrite: NEGATIVE
Specific Gravity, Urine: 1.02 (ref 1.001–1.035)
pH: 7 (ref 5.0–8.0)

## 2022-06-16 LAB — MICROSCOPIC MESSAGE

## 2022-06-16 MED ORDER — AMOXICILLIN-POT CLAVULANATE 875-125 MG PO TABS: 1.0000 | ORAL_TABLET | Freq: Two times a day (BID) | ORAL | 0 refills | Status: DC

## 2022-06-16 NOTE — Addendum Note (Signed)
Addended by: Randal Buba K on: 06/16/2022 03:07 PM   Modules accepted: Orders

## 2022-06-16 NOTE — Progress Notes (Signed)
Subjective:    Patient ID: Heather Gillespie, female    DOB: 1961/04/04, 61 y.o.   MRN: [id number]  Urinary Tract Infection     04/21/22 Patient has a very complex past medical history.  She reports that she has had numerous surgeries due to hemorrhoids.  She states that she has had over 20 surgeries in the past for this.  She states that recently she has been having copious amounts of rectal bleeding.  She has a picture today of a waste paper basket filled with toilet paper and soaked in bright red blood.  She states this is been going on now for almost 2 weeks.  She went to an urgent care on July 21 and was diagnosed with a urinary tract infection.  Apparently this was because of hematuria and an abnormal urinalysis.  However now the patient states that she was having copious rectal bleeding when she gave a urine sample.  Urine culture showed multiple species most likely contamination.  She then went to the emergency room on August 2.  She went to the emergency room due to gross hematuria.  Urinalysis showed 3+ hemoglobin.  She had an CT scan: FINDINGS: Lower chest: No acute abnormality.   Hepatobiliary: No suspicious focal liver abnormality is seen. Cholecystectomy. No biliary ductal dilation.   Pancreas: Unremarkable. No pancreatic ductal dilatation or surrounding inflammatory changes.   Spleen: Normal in size without focal abnormality.   Adrenals/Urinary Tract: Adrenal glands are unremarkable. Kidneys are normal, without renal calculi, suspicious focal lesion, or hydronephrosis. Bladder is unremarkable.   Stomach/Bowel: Stomach is within normal limits. The appendix is normal. No evidence of bowel wall thickening, distention, or inflammatory changes.   Vascular/Lymphatic: Mild aortic atherosclerosis. No enlarged abdominal or pelvic lymph nodes.   Reproductive: 4.8 cm cystic lesion in the right adnexa with central homogeneous low fluid attenuation.   Other: No free  intraperitoneal fluid or air.   Musculoskeletal: No acute or significant osseous findings. She is here today for follow-up.  Again she has +3 blood on urinalysis.  She has positive nitrates, negative leukocyte esterase.  She denies any dysuria.  She has been on Bactrim as well as Keflex.  She states that she is having blood come out of her rectum as well.  I performed a rectal exam today.  External rectal exam shows no external hemorrhoids or fissures or skin tears or visible blood.  I am unable to perform an internal exam.  She had a colonoscopy last year that showed angiodysplasia inside the rectum.  She also had several polyps.  At that time there were no internal hemorrhoids.  At that time, my plan was:  At this point I am not sure if her gross hematuria is due to rectal bleeding contaminating the sample or if she has rectal bleeding and gross hematuria simultaneously.  Patient is most concerned today about rectal bleeding.  Rectal exam is unremarkable.  I will set the patient up to see her gastroenterologist as I believe she would benefit from sigmoidoscopy.  If there is an active source of bleeding inside the rectum, perhaps they can facilitate hemostasis.  Once this source of bleeding has been resolved, repeat urinalysis and if blood persists, the patient will need cystoscopy given the normal CT scan.  06/16/22 Since I last saw the patient, she has seen GI and had sigmoidoscopy.  There was no source of bleeding identified although they did take several biopsies.  She is also seen urology.  Apparently  she had urinary retention.  She has an appointment for urodynamics this Friday to evaluate for causes of urinary retention.  She also has an appointment for cystoscopy coming up in November.  The patient presents today for her Pap smear.  She also reports dysuria.  She has been treated numerous times for urinary tract infection.  Today her urinalysis shows leukocyte esterase and blood.  She had a urine  culture on 921 that grew Enterococcus as well as staph hemolyticus. Past Medical History:  Diagnosis Date   Abdominal pain    Allergy    GERD (gastroesophageal reflux disease)    Glaucoma    Hemorrhoids    Hiatal hernia    HTN (hypertension)    IBS (irritable bowel syndrome)    N&V (nausea and vomiting)    Osteoporosis    Rectal bleeding    Rectal ulcer    Sinus problem    TMJ (dislocation of temporomandibular joint)    Vitamin D deficiency    Past Surgical History:  Procedure Laterality Date   ANAL FISSURE REPAIR     BACK SURGERY     CHOLECYSTECTOMY  1984   COLONOSCOPY W/ POLYPECTOMY     colon ulcers   EYE SURGERY     HEMI-MICRODISCECTOMY LUMBAR LAMINECTOMY LEVEL 1 Left 05/26/2013   Procedure: HEMI-MICRODISCECTOMY LUMBAR LAMINECTOMY L4 - L5 ON THE LEFT LEVEL 1;  Surgeon: Tobi Bastos, MD;  Location: [facility];  Service: Orthopedics;  Laterality: Left;   HEMORROIDECTOMY     Current Outpatient Medications on File Prior to Visit  Medication Sig Dispense Refill   alendronate (FOSAMAX) 70 MG tablet TAKE 1 TABLET BY MOUTH EVERY 7 DAYS. TAKE WITH A FULL GLASS OF WATER ON AN EMPTY STOMACH. 4 tablet 11   cromolyn (OPTICROM) 4 % ophthalmic solution INSTILL 1 DROP INTO BOTH EYES 4 TIMES A DAY. Strength: 4 % 10 mL 2   dexlansoprazole (DEXILANT) 60 MG capsule Take 60 mg by mouth daily.     Ergocalciferol (VITAMIN D2) 2000 units TABS Take 1 tablet by mouth daily.     fexofenadine (ALLEGRA) 180 MG tablet Take 1 tablet (180 mg total) by mouth daily. 90 tablet 3   fluticasone (FLONASE) 50 MCG/ACT nasal spray SPRAY 2 SPRAYS INTO EACH NOSTRIL EVERY DAY (Patient taking differently: Place 2 sprays into both nostrils daily.) 16 mL 11   gabapentin (NEURONTIN) 300 MG capsule Take 1 capsule (300 mg total) by mouth 3 (three) times daily. 90 capsule 3   hydrochlorothiazide (HYDRODIURIL) 25 MG tablet Take 1 tablet (25 mg total) by mouth daily. 90 tablet 3   losartan (COZAAR) 100 MG tablet Take 1  tablet (100 mg total) by mouth daily. 90 tablet 0   ondansetron (ZOFRAN) 4 MG tablet Take 1 tablet (4 mg total) by mouth every 6 (six) hours. 12 tablet 0   PATADAY 0.7 % SOLN INSTILL 1 DROP INTO BOTH EYES EVERY DAY 2.5 mL 3   potassium chloride SA (KLOR-CON M) 20 MEQ tablet Take 1 tablet (20 mEq total) by mouth daily. 3 tablet 0   Probiotic, Lactobacillus, CAPS Take 1 capsule by mouth daily. 30 capsule 0   promethazine (PHENERGAN) 25 MG tablet Take 1 tablet (25 mg total) by mouth every 6 (six) hours as needed for nausea or vomiting. 10 tablet 0   dicyclomine (BENTYL) 20 MG tablet Take 1 tablet (20 mg total) by mouth 3 (three) times daily as needed for spasms. (Patient not taking: Reported on 06/16/2022) 20 tablet  0   fluconazole (DIFLUCAN) 150 MG tablet Take 1 tablet (150 mg total) by mouth daily. (Patient not taking: Reported on 06/16/2022) 1 tablet 0   ibuprofen (ADVIL) 800 MG tablet Take 1 tablet (800 mg total) by mouth 3 (three) times daily. (Patient not taking: Reported on 06/16/2022) 21 tablet 0   No current facility-administered medications on file prior to visit.   Allergies  Allergen Reactions   Hydroxyzine     Other reaction(s): Unknown   Lotronex [Alosetron Hcl]     Other reaction(s): Unknown   Other Other (See Comments)   Hydroxyzine Pamoate Rash   Morphine Sulfate Anxiety    Pt woke up having hallucinations and very anxious   Social History   Socioeconomic History   Marital status: Single    Spouse name: Not on file   Number of children: Not on file   Years of education: Not on file   Highest education level: Not on file  Occupational History   Not on file  Tobacco Use   Smoking status: Never   Smokeless tobacco: Never  Vaping Use   Vaping Use: Never used  Substance and Sexual Activity   Alcohol use: No   Drug use: No   Sexual activity: Not Currently  Other Topics Concern   Not on file  Social History Narrative   Not on file   Social Determinants of Health    Financial Resource Strain: Not on file  Food Insecurity: Not on file  Transportation Needs: Not on file  Physical Activity: Not on file  Stress: Not on file  Social Connections: Not on file  Intimate Partner Violence: Not on file     Review of Systems  All other systems reviewed and are negative.      Objective:   Physical Exam Vitals reviewed. Exam conducted with a chaperone present.  Constitutional:      General: She is not in acute distress.    Appearance: Normal appearance. She is normal weight. She is not ill-appearing, toxic-appearing or diaphoretic.  HENT:     Nose: Nose normal. No congestion or rhinorrhea.     Mouth/Throat:     Mouth: Mucous membranes are moist.     Pharynx: Oropharynx is clear. No oropharyngeal exudate or posterior oropharyngeal erythema.  Eyes:     Conjunctiva/sclera: Conjunctivae normal.     Pupils: Pupils are equal, round, and reactive to light.  Cardiovascular:     Rate and Rhythm: Normal rate and regular rhythm.     Heart sounds: Normal heart sounds. No murmur heard.    No friction rub. No gallop.  Pulmonary:     Effort: Pulmonary effort is normal. No respiratory distress.     Breath sounds: Normal breath sounds. No stridor. No wheezing, rhonchi or rales.  Abdominal:     General: Bowel sounds are normal. There is no distension.     Palpations: Abdomen is soft.     Tenderness: There is no abdominal tenderness. There is no guarding or rebound.  Genitourinary:    Vagina: Tenderness present.     Cervix: Friability present.     Uterus: Normal. Not enlarged.      Adnexa:        Right: Tenderness and fullness present.      Rectum: Normal. No mass, tenderness, anal fissure or external hemorrhoid. Normal anal tone.  Musculoskeletal:     Right lower leg: No edema.     Left lower leg: No edema.  Skin:  Findings: No lesion or rash.  Neurological:     General: No focal deficit present.     Mental Status: She is alert and oriented to  person, place, and time. Mental status is at baseline.     Cranial Nerves: No cranial nerve deficit.     Sensory: No sensory deficit.     Motor: No weakness.     Coordination: Coordination normal.  Psychiatric:        Mood and Affect: Mood normal.        Behavior: Behavior normal.        Thought Content: Thought content normal.        Judgment: Judgment normal.           Assessment & Plan:  Gross hematuria - Plan: Urinalysis, Routine w reflex microscopic, Urine Culture  General medical exam  Benign essential HTN  Ovarian mass, right Given the previous urine culture, will treat the patient with Augmentin to cover staph as well as Enterococcus.  I am repeating a urine culture today.  Pap smear was sent to pathology and labeled container.  Await the results of her cystoscopy.  Patient does have a 4.8 cm cystic lesion on her right adnexa and she is having pelvic pressure.  If cystoscopy is normal, she continues to have a lot of pelvic discomfort I will recommend GYN consultation given the cystic lesion on the right adnexa.  Blood pressure today is acceptable.  Await the results of the Pap smear and her cystoscopy..  Therefore I feel that she must have a cystoscopy to rule out bladder cancer.  However a cyst on her right adnexa would not explain gross hematuria

## 2022-06-16 NOTE — Addendum Note (Signed)
Addended by: Jenna Luo T on: 06/16/2022 02:32 PM   Modules accepted: Orders

## 2022-06-17 LAB — URINE CULTURE
MICRO NUMBER:: 13994184
SPECIMEN QUALITY:: ADEQUATE

## 2022-06-17 LAB — PAP, TP IMAGING W/ HPV RNA, RFLX HPV TYPE 16,18/45: HPV DNA High Risk: NOT DETECTED

## 2022-06-20 NOTE — Telephone Encounter (Signed)
error 

## 2022-06-22 ENCOUNTER — Ambulatory Visit (HOSPITAL_COMMUNITY)
Admission: EM | Admit: 2022-06-22 | Discharge: 2022-06-22 | Disposition: A | Discharge status: Home / Self Care | Payer: Medicaid Other | Attending: Physician Assistant | Admitting: Physician Assistant

## 2022-06-22 ENCOUNTER — Other Ambulatory Visit: Payer: Self-pay

## 2022-06-22 ENCOUNTER — Encounter (HOSPITAL_COMMUNITY): Payer: Self-pay | Admitting: *Deleted

## 2022-06-22 ENCOUNTER — Ambulatory Visit (INDEPENDENT_AMBULATORY_CARE_PROVIDER_SITE_OTHER): Payer: Medicaid Other

## 2022-06-22 DIAGNOSIS — M545 Low back pain, unspecified: Secondary | ICD-10-CM | POA: Diagnosis not present

## 2022-06-22 DIAGNOSIS — M5442 Lumbago with sciatica, left side: Secondary | ICD-10-CM

## 2022-06-22 DIAGNOSIS — W19XXXA Unspecified fall, initial encounter: Secondary | ICD-10-CM

## 2022-06-22 DIAGNOSIS — M546 Pain in thoracic spine: Secondary | ICD-10-CM | POA: Diagnosis not present

## 2022-06-22 MED ORDER — METHOCARBAMOL 500 MG PO TABS: 500.0000 mg | ORAL_TABLET | Freq: Two times a day (BID) | ORAL | 0 refills | Status: DC

## 2022-06-22 MED ORDER — IBUPROFEN 800 MG PO TABS: 800.0000 mg | ORAL_TABLET | Freq: Three times a day (TID) | ORAL | 0 refills | Status: DC

## 2022-06-22 NOTE — Discharge Instructions (Signed)
Your x-rays did not show any new fracture which is great news.  Take ibuprofen for pain.  Do not take NSAIDs including aspirin, ibuprofen/Advil, naproxen/Aleve with this medication as it can cause stomach bleeding.  You can use Tylenol/acetaminophen for your pain relief.  Take Robaxin up to twice a day.  This will make you sleepy do not drive or drink alcohol with taking it.  Follow-up with sports medicine; call to schedule an appointment.  Use heat, rest, stretch.  If anything worsens and you have increased pain, difficulty moving your legs, weakness in your legs, numbness or tingling sensation in your legs, going to the bathroom on yourself without noticing it you need to go to the emergency room immediately.

## 2022-06-22 NOTE — ED Triage Notes (Signed)
Pt reports falling into her sewing machine last night and hit her back . Pt reports mid ,lower back and the pain goes down  down Lt leg.

## 2022-06-22 NOTE — ED Provider Notes (Signed)
[place]    CSN: [id number] Arrival date & time: 06/22/22  1217      History   Chief Complaint Chief Complaint  Patient presents with   Back Pain    HPI PORCHA DEBLANC is a 61 y.o. female.   Patient presents today with a 12-hour history of back pain following injury.  Reports that she was rolling over in bed when she hit her back on the sewing machine near her bed.  She did not fall but this time she caught her.  She has had ongoing severe back pain since that time.  Pain is rated 10 on a 0-10 pain scale, described as sharp, worse with palpation or certain movements, no alleviating factors identified.  She does have a history of back surgery in 2014.  She does have a history of chronic back pain as well as radiculopathy symptoms but states current symptoms are more extreme than previous episodes of this condition.  She has tried Tylenol without improvement of symptoms.  She denies any head injury or associated symptoms including headache, dizziness, nausea, vomiting, weakness.  Denies any bowel/bladder incontinence, lower extremity weakness, saddle anesthesia.  Denies any urinary symptoms.    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 05/26/2013   Vitamin D deficiency    TMJ (dislocation of temporomandibular joint)    Osteoporosis    Rectal pain 03/31/2011   ANEMIA, IRON DEFICIENCY NOS 02/19/2007   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    HEMORRHOIDS NOS, W/O COMPLICATIONS 69/67/8938   RHINITIS, ALLERGIC NOS 02/19/2007   IBS 02/19/2007   DIARRHEA, CHRONIC 02/19/2007   UTI (urinary tract infection) 02/19/2007    Past Surgical History:  Procedure Laterality Date   ANAL FISSURE REPAIR     BACK SURGERY     CHOLECYSTECTOMY  1984   COLONOSCOPY W/ POLYPECTOMY     colon ulcers   EYE SURGERY     HEMI-MICRODISCECTOMY LUMBAR LAMINECTOMY LEVEL 1 Left 05/26/2013   Procedure: HEMI-MICRODISCECTOMY LUMBAR LAMINECTOMY L4 - L5 ON THE LEFT LEVEL 1;  Surgeon: Tobi Bastos, MD;  Location: [facility];  Service: Orthopedics;  Laterality: Left;   HEMORROIDECTOMY      OB History   No obstetric history on file.      Home Medications    Prior to Admission medications   Medication Sig Start Date End Date Taking? Authorizing Provider  alendronate (FOSAMAX) 70 MG tablet TAKE 1 TABLET BY MOUTH EVERY 7 DAYS. TAKE WITH A FULL GLASS OF WATER ON AN EMPTY STOMACH. 05/26/22   Susy Frizzle, MD  amoxicillin-clavulanate (AUGMENTIN) 875-125 MG tablet Take 1 tablet by mouth 2 (two) times daily. 06/16/22   Susy Frizzle, MD  cromolyn (OPTICROM) 4 % ophthalmic solution INSTILL 1 DROP INTO BOTH EYES 4 TIMES A DAY. Strength: 4 % 05/22/22   Susy Frizzle, MD  dexlansoprazole (DEXILANT) 60 MG capsule Take 60 mg by mouth daily.    [provider]  dicyclomine (BENTYL) 20 MG tablet Take 1 tablet (20 mg total) by mouth 3 (three) times daily as needed for spasms. Patient not taking: Reported on 06/16/2022 05/25/22   Jacqlyn Larsen, PA-C  Ergocalciferol (VITAMIN D2) 2000 units TABS Take 1 tablet by mouth daily.    [provider]  fexofenadine (ALLEGRA) 180 MG tablet Take 1 tablet (180 mg total) by mouth daily. 10/04/21   Susy Frizzle, MD  fluconazole (DIFLUCAN) 150 MG tablet Take 1 tablet (150 mg total) by mouth daily. Patient not taking: Reported on 06/16/2022 06/05/22   Domenic Moras, PA-C  fluticasone (FLONASE) 50 MCG/ACT nasal  spray SPRAY 2 SPRAYS INTO EACH NOSTRIL EVERY DAY Patient taking differently: Place 2 sprays into both nostrils daily. 11/28/21   Susy Frizzle, MD  gabapentin (NEURONTIN) 300 MG capsule Take 1 capsule (300 mg total) by mouth 3 (three) times daily. 05/08/22   Susy Frizzle, MD  hydrochlorothiazide (HYDRODIURIL) 25 MG tablet Take 1 tablet (25 mg total) by mouth daily. 09/10/21   Susy Frizzle, MD  ibuprofen (ADVIL) 800 MG tablet Take 1 tablet (800 mg total) by mouth 3 (three) times daily. 06/22/22   Sharmain Lastra, Derry Skill, PA-C  losartan (COZAAR) 100 MG tablet Take 1 tablet (100 mg total) by mouth daily. 06/10/22   Susy Frizzle, MD  methocarbamol (ROBAXIN) 500 MG tablet Take 1 tablet (500 mg total) by mouth 2 (two) times daily. 06/22/22  Yes Ileanna Gemmill K, PA-C  ondansetron (ZOFRAN) 4 MG tablet Take 1 tablet (4 mg total) by mouth every 6 (six) hours. 05/25/22   Jacqlyn Larsen, PA-C  PATADAY 0.7 % SOLN INSTILL 1 DROP INTO BOTH EYES EVERY DAY 10/15/21   Susy Frizzle, MD  potassium chloride SA (KLOR-CON M) 20 MEQ tablet Take 1 tablet (20 mEq total) by mouth daily. 06/05/22   Domenic Moras, PA-C  Probiotic, Lactobacillus, CAPS Take 1 capsule by mouth daily. 05/25/22   Jacqlyn Larsen, PA-C  promethazine (PHENERGAN) 25 MG tablet Take 1 tablet (25 mg total) by mouth every 6 (six) hours as needed for nausea or vomiting. 06/05/22   Domenic Moras, PA-C    Family History Family History  Problem Relation Age of Onset   Diabetes Mother    Cancer Mother    Stroke Father    Breast cancer Neg Hx     Social History Social History   Tobacco Use   Smoking status: Never   Smokeless tobacco: Never  Vaping Use   Vaping Use: Never used  Substance Use Topics   Alcohol use: No   Drug use: No     Allergies   Hydroxyzine, Lotronex [alosetron hcl], Other, Hydroxyzine pamoate, and Morphine sulfate   Review of Systems Review of Systems  Constitutional:  Positive for activity change. Negative for appetite  change, fatigue and fever.  Genitourinary:  Negative for decreased urine volume, difficulty urinating, dysuria, frequency and urgency.  Musculoskeletal:  Positive for back pain. Negative for arthralgias and myalgias.  Neurological:  Negative for dizziness, weakness, light-headedness, numbness and headaches.     Physical Exam Triage Vital Signs ED Triage Vitals  Enc Vitals Group     BP 06/22/22 1238 113/71     Pulse Rate 06/22/22 1238 70     Resp 06/22/22 1238 18     Temp 06/22/22 1238 98.3 F (36.8 C)  Temp src --      SpO2 06/22/22 1238 97 %     Weight --      Height --      Head Circumference --      Peak Flow --      Pain Score 06/22/22 1236 10     Pain Loc --      Pain Edu? --      Excl. in [place]? --    No data found.  Updated Vital Signs BP 113/71   Pulse 70   Temp 98.3 F (36.8 C)   Resp 18   SpO2 97%   Visual Acuity Right Eye Distance:   Left Eye Distance:   Bilateral Distance:    Right Eye Near:   Left Eye Near:    Bilateral Near:     Physical Exam Vitals reviewed.  Constitutional:      General: She is awake. She is not in acute distress.    Appearance: Normal appearance. She is well-developed. She is not ill-appearing.     Comments: Very pleasant female appears stated age in no acute distress sitting comfortably in exam room  HENT:     Head: Normocephalic and atraumatic.  Cardiovascular:     Rate and Rhythm: Normal rate and regular rhythm.     Heart sounds: Normal heart sounds, S1 normal and S2 normal. No murmur heard. Pulmonary:     Effort: Pulmonary effort is normal.     Breath sounds: Normal breath sounds. No wheezing, rhonchi or rales.     Comments: Clear to auscultation bilaterally Abdominal:     Palpations: Abdomen is soft.     Tenderness: There is no abdominal tenderness.  Musculoskeletal:     Cervical back: No tenderness or bony tenderness.     Thoracic back: Tenderness and bony tenderness present.     Lumbar back: Tenderness and  bony tenderness present. Negative right straight leg raise test and negative left straight leg raise test.       Back:     Comments: Pain with percussion of vertebrae and lower thoracic and superior lumbar region of spine without deformity.  Tenderness palpation of bilateral paraspinal muscles.  Strength 5/5 bilateral lower extremities.  Psychiatric:        Behavior: Behavior is cooperative.      UC Treatments / Results  Labs (all labs ordered are listed, but only abnormal results are displayed) Labs Reviewed - No data to display  EKG   Radiology DG Lumbar Spine Complete  Result Date: 06/22/2022 CLINICAL DATA:  Fall last night.  Back pain. EXAM: LUMBAR SPINE - COMPLETE 5 VIEW COMPARISON:  CT of the abdomen and pelvis 05/25/2022 FINDINGS: 5 non rib-bearing lumbar type vertebral bodies are present. Vertebral body heights are maintained. Facet degenerative changes at L5-S1 are worse right than left. Visualized sacrum is normal. IMPRESSION: 1. No acute abnormality. 2. Facet degenerative changes at L5-S1. Electronically Signed   By: San Morelle M.D.   On: 06/22/2022 13:24   DG Thoracic Spine 2 View  Result Date: 06/22/2022 CLINICAL DATA:  Fall last night.  Back pain. EXAM: THORACIC SPINE 2 VIEWS COMPARISON:  Two-view chest x-ray 05/26/2013 FINDINGS: Twelve rib-bearing thoracic type vertebral bodies are present. Vertebral body heights and alignment are normal. No acute fractures are present. Visualized lung fields are clear. IMPRESSION: Negative thoracic spine radiographs. Electronically Signed   By: San Morelle M.D.   On: 06/22/2022 13:23    Procedures Procedures (including critical care time)  Medications Ordered in UC Medications - No data to display  Initial Impression / Assessment and Plan / UC Course  I have reviewed the triage vital signs and the nursing notes.  Pertinent labs & imaging results that were available during my care of the patient were reviewed by  me and considered in my medical decision making (see chart for details).     X-ray obtained of thoracic and lumbar spine which showed no acute abnormality but did show degenerative changes.  Recommended conservative treatment measures including heat, rest, stretch.  Patient was started on ibuprofen for pain relief with instruction not to take additional NSAIDs with this medication.  She was started on Robaxin with instruction not to drive or drink alcohol with taking this medication.  Lidocaine patches were prescribed as well for pain relief.  Recommend she follow-up with sports medicine if symptoms or not improving quickly and she was given contact information for local provider with instruction to call to schedule an appointment.  Discussed that if she has any worsening symptoms including severe pain, lower extremity weakness, saddle anesthesia, bowel/bladder incontinence, fever she needs to go to the emergency room to which she expressed understanding.  Final Clinical Impressions(s) / UC Diagnoses   Final diagnoses:  Acute midline thoracic back pain  Acute midline low back pain with left-sided sciatica     Discharge Instructions      Your x-rays did not show any new fracture which is great news.  Take ibuprofen for pain.  Do not take NSAIDs including aspirin, ibuprofen/Advil, naproxen/Aleve with this medication as it can cause stomach bleeding.  You can use Tylenol/acetaminophen for your pain relief.  Take Robaxin up to twice a day.  This will make you sleepy do not drive or drink alcohol with taking it.  Follow-up with sports medicine; call to schedule an appointment.  Use heat, rest, stretch.  If anything worsens and you have increased pain, difficulty moving your legs, weakness in your legs, numbness or tingling sensation in your legs, going to the bathroom on yourself without noticing it you need to go to the emergency room immediately.     ED Prescriptions     Medication Sig Dispense  Auth. Provider   ibuprofen (ADVIL) 800 MG tablet Take 1 tablet (800 mg total) by mouth 3 (three) times daily. 15 tablet Mikias Lanz K, PA-C   methocarbamol (ROBAXIN) 500 MG tablet Take 1 tablet (500 mg total) by mouth 2 (two) times daily. 10 tablet Marton Malizia, Derry Skill, PA-C      PDMP not reviewed this encounter.   Terrilee Croak, PA-C 06/22/22 1344

## 2022-07-04 ENCOUNTER — Encounter (HOSPITAL_COMMUNITY): Payer: Self-pay

## 2022-07-04 ENCOUNTER — Ambulatory Visit (HOSPITAL_COMMUNITY)
Admission: EM | Admit: 2022-07-04 | Discharge: 2022-07-04 | Disposition: A | Discharge status: Home / Self Care | Payer: Medicaid Other | Attending: Family Medicine | Admitting: Family Medicine

## 2022-07-04 DIAGNOSIS — R310 Gross hematuria: Secondary | ICD-10-CM | POA: Diagnosis present

## 2022-07-04 DIAGNOSIS — R1030 Lower abdominal pain, unspecified: Secondary | ICD-10-CM | POA: Insufficient documentation

## 2022-07-04 DIAGNOSIS — N390 Urinary tract infection, site not specified: Secondary | ICD-10-CM

## 2022-07-04 DIAGNOSIS — R319 Hematuria, unspecified: Secondary | ICD-10-CM | POA: Diagnosis present

## 2022-07-04 DIAGNOSIS — N83209 Unspecified ovarian cyst, unspecified side: Secondary | ICD-10-CM

## 2022-07-04 LAB — CBC WITH DIFFERENTIAL/PLATELET
Abs Immature Granulocytes: 0.01 10*3/uL (ref 0.00–0.07)
Basophils Absolute: 0 10*3/uL (ref 0.0–0.1)
Basophils Relative: 1 %
Eosinophils Absolute: 0.1 10*3/uL (ref 0.0–0.5)
Eosinophils Relative: 1 %
HCT: 35.6 % — ABNORMAL LOW (ref 36.0–46.0)
Hemoglobin: 12.3 g/dL (ref 12.0–15.0)
Immature Granulocytes: 0 %
Lymphocytes Relative: 34 %
Lymphs Abs: 1.5 10*3/uL (ref 0.7–4.0)
MCH: 31 pg (ref 26.0–34.0)
MCHC: 34.6 g/dL (ref 30.0–36.0)
MCV: 89.7 fL (ref 80.0–100.0)
Monocytes Absolute: 0.3 10*3/uL (ref 0.1–1.0)
Monocytes Relative: 7 %
Neutro Abs: 2.6 10*3/uL (ref 1.7–7.7)
Neutrophils Relative %: 57 %
Platelets: 176 10*3/uL (ref 150–400)
RBC: 3.97 MIL/uL (ref 3.87–5.11)
RDW: 12.4 % (ref 11.5–15.5)
WBC: 4.5 10*3/uL (ref 4.0–10.5)
nRBC: 0 % (ref 0.0–0.2)

## 2022-07-04 LAB — POCT URINALYSIS DIPSTICK, ED / UC
Glucose, UA: NEGATIVE mg/dL
Ketones, ur: 15 mg/dL — AB
Nitrite: POSITIVE — AB
Protein, ur: >=300 mg/dL — AB
Specific Gravity, Urine: 1.01 (ref 1.005–1.030)
Urobilinogen, UA: 1 mg/dL (ref 0.0–1.0)
pH: >=9 (ref 5.0–8.0)

## 2022-07-04 MED ORDER — AMOXICILLIN-POT CLAVULANATE 875-125 MG PO TABS: 1.0000 | ORAL_TABLET | Freq: Two times a day (BID) | ORAL | 0 refills | Status: DC

## 2022-07-04 MED ORDER — FLUCONAZOLE 150 MG PO TABS: ORAL_TABLET | ORAL | 0 refills | Status: DC

## 2022-07-04 NOTE — Discharge Instructions (Signed)
You were seen today for possible UTI.  I am treating you with augmentin twice/day x 10 days;  Take with food to avoid upset stomach.  Your blood work will be resulted later today or tomorrow, and you will be notified of results and next steps if needed.  Please follow up with your urologist as you have been.  Please go to the ER if you have worsening symptoms despite treatment.

## 2022-07-04 NOTE — ED Triage Notes (Signed)
Pt reports dysuria for several days. Pt would like a referral to gyn.

## 2022-07-04 NOTE — ED Provider Notes (Signed)
[facility]    CSN: [id number] Arrival date & time: 07/04/22  1056      History   Chief Complaint Chief Complaint  Patient presents with   Urinary Tract Infection    HPI Heather Gillespie is a 61 y.o. female.   Patient is here for hematuria, and painful urination.   This episode has been going on x 5 days.  She saw her urologist and had a CT scan last week.  She did call her urologist about her new symptoms, and have plans to have a cysto next month.  She did see her pcp on 10/2, dx with possible uti and treated with augmentin.  This did help with her symptoms, although the urine culture was normal.  Her symtpoms have returned.  She has low abd pain, and when she starts to bleed she feels better.  She was told she may have a "blockage in her tubes".    She has had this for a while.  At times the blood just runs down her legs and is a puddle on the floor.  She is dizzy at times.  Looks pale as well.  She has seen her pcp and urologist for this.  She also wonders about seeing ob/gyn.  Per her last pcp note this is an option after the cysto if needed.  She has an ovarian cyst that she feels "needs to come out".          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 05/26/2013   Vitamin D deficiency    TMJ (dislocation of temporomandibular joint)    Osteoporosis    Rectal pain 03/31/2011   ANEMIA, IRON DEFICIENCY NOS 02/19/2007    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   HEMORRHOIDS NOS, W/O COMPLICATIONS 96/78/9381   RHINITIS, ALLERGIC NOS 02/19/2007   IBS 02/19/2007   DIARRHEA, CHRONIC 02/19/2007   UTI (urinary tract infection) 02/19/2007    Past Surgical History:  Procedure Laterality Date   ANAL FISSURE REPAIR     BACK SURGERY     CHOLECYSTECTOMY  1984   COLONOSCOPY W/ POLYPECTOMY     colon ulcers   EYE SURGERY     HEMI-MICRODISCECTOMY LUMBAR LAMINECTOMY LEVEL 1 Left 05/26/2013   Procedure: HEMI-MICRODISCECTOMY LUMBAR LAMINECTOMY L4 - L5 ON THE LEFT LEVEL 1;  Surgeon: Tobi Bastos, MD;  Location: [facility];  Service: Orthopedics;  Laterality: Left;   HEMORROIDECTOMY      OB History   No obstetric history on file.      Home Medications    Prior to Admission medications   Medication Sig Start Date End Date Taking? Authorizing Provider  alendronate (FOSAMAX) 70 MG tablet TAKE 1 TABLET BY MOUTH EVERY 7 DAYS. TAKE WITH A FULL GLASS OF WATER ON AN EMPTY STOMACH. 05/26/22   Susy Frizzle, MD  amoxicillin-clavulanate (AUGMENTIN) [phone]  MG tablet Take 1 tablet by mouth 2 (two) times daily. 06/16/22   Susy Frizzle, MD  cromolyn (OPTICROM) 4 % ophthalmic solution INSTILL 1 DROP INTO BOTH EYES 4 TIMES A DAY. Strength: 4 % 05/22/22   Susy Frizzle, MD  dexlansoprazole (DEXILANT) 60 MG capsule Take 60 mg by mouth daily.    [provider]  dicyclomine (BENTYL) 20 MG tablet Take 1 tablet (20 mg total) by mouth 3 (three) times daily as needed for spasms. Patient not taking: Reported on 06/16/2022 05/25/22   Jacqlyn Larsen, PA-C  Ergocalciferol (VITAMIN D2) 2000 units TABS Take 1 tablet by mouth daily.    [provider]  fexofenadine (ALLEGRA) 180 MG tablet Take 1 tablet (180 mg total) by mouth daily. 10/04/21   Susy Frizzle, MD  fluconazole (DIFLUCAN) 150 MG tablet Take 1 tablet (150 mg total) by mouth daily. Patient not taking: Reported on 06/16/2022 06/05/22   Domenic Moras, PA-C   fluticasone (FLONASE) 50 MCG/ACT nasal spray SPRAY 2 SPRAYS INTO EACH NOSTRIL EVERY DAY Patient taking differently: Place 2 sprays into both nostrils daily. 11/28/21   Susy Frizzle, MD  gabapentin (NEURONTIN) 300 MG capsule Take 1 capsule (300 mg total) by mouth 3 (three) times daily. 05/08/22   Susy Frizzle, MD  hydrochlorothiazide (HYDRODIURIL) 25 MG tablet Take 1 tablet (25 mg total) by mouth daily. 09/10/21   Susy Frizzle, MD  ibuprofen (ADVIL) 800 MG tablet Take 1 tablet (800 mg total) by mouth 3 (three) times daily. 06/22/22   Raspet, Derry Skill, PA-C  losartan (COZAAR) 100 MG tablet Take 1 tablet (100 mg total) by mouth daily. 06/10/22   Susy Frizzle, MD  methocarbamol (ROBAXIN) 500 MG tablet Take 1 tablet (500 mg total) by mouth 2 (two) times daily. 06/22/22   Raspet, Trong Gosling K, PA-C  ondansetron (ZOFRAN) 4 MG tablet Take 1 tablet (4 mg total) by mouth every 6 (six) hours. 05/25/22   Jacqlyn Larsen, PA-C  PATADAY 0.7 % SOLN INSTILL 1 DROP INTO BOTH EYES EVERY DAY 10/15/21   Susy Frizzle, MD  potassium chloride SA (KLOR-CON M) 20 MEQ tablet Take 1 tablet (20 mEq total) by mouth daily. 06/05/22   Domenic Moras, PA-C  Probiotic, Lactobacillus, CAPS Take 1 capsule by mouth daily. 05/25/22   Jacqlyn Larsen, PA-C  promethazine (PHENERGAN) 25 MG tablet Take 1 tablet (25 mg total) by mouth every 6 (six) hours as needed for nausea or vomiting. 06/05/22   Domenic Moras, PA-C    Family History Family History  Problem Relation Age of Onset   Diabetes Mother    Cancer Mother    Stroke Father    Breast cancer Neg Hx     Social History Social History   Tobacco Use   Smoking status: Never   Smokeless tobacco: Never  Vaping Use   Vaping Use: Never used  Substance Use Topics   Alcohol use: No   Drug use: No     Allergies   Hydroxyzine, Lotronex [alosetron hcl], Other, Hydroxyzine pamoate, and Morphine sulfate   Review of Systems Review of Systems  Constitutional: Negative.    HENT: Negative.    Respiratory: Negative.    Cardiovascular: Negative.   Gastrointestinal:  Positive for abdominal pain. Negative for nausea and vomiting.  Genitourinary:  Positive for dysuria and hematuria.  Musculoskeletal: Negative.   Psychiatric/Behavioral: Negative.       Physical Exam Triage Vital Signs ED Triage Vitals [07/04/22 1140]  Enc Vitals Group     BP (!) 118/55     Pulse Rate 64     Resp 16     Temp 98.1 F (36.7 C)     Temp Source Oral     SpO2 98 %     Weight      Height      Head Circumference      Peak Flow      Pain Score      Pain Loc      Pain Edu?      Excl. in [place]?    No data found.  Updated Vital Signs BP (!) 118/55 (BP Location: Left Arm)   Pulse 64   Temp 98.1 F (36.7 C) (Oral)   Resp 16   SpO2 98%   Visual Acuity Right Eye Distance:   Left Eye Distance:   Bilateral Distance:    Right Eye Near:   Left Eye Near:    Bilateral Near:     Physical Exam Constitutional:      Appearance: Normal appearance.  Cardiovascular:     Rate and Rhythm: Normal rate and regular rhythm.  Pulmonary:     Effort: Pulmonary effort is normal.     Breath sounds: Normal breath sounds.  Abdominal:     Palpations: Abdomen is soft.     Tenderness: There is abdominal tenderness.     Comments: Suprapubic tenderness  Musculoskeletal:        General: Normal range of motion.  Skin:    General: Skin is warm.  Neurological:     General: No focal deficit present.     Mental Status: She is alert.  Psychiatric:        Mood and Affect: Mood normal.      UC Treatments / Results  Labs (all labs ordered are listed, but only abnormal results are displayed) Labs Reviewed  POCT URINALYSIS DIPSTICK, ED / UC - Abnormal; Notable for the following components:      Result Value   Bilirubin Urine LARGE (*)    Ketones, ur 15 (*)    Hgb urine dipstick LARGE (*)    Protein, ur >=300 (*)    Nitrite POSITIVE (*)    Leukocytes,Ua LARGE (*)    All other  components within normal limits    EKG   Radiology No results found.  Procedures Procedures (including critical care time)  Medications Ordered in UC Medications - No data to display  Initial Impression / Assessment and Plan / UC Course  I have reviewed the triage vital signs and the nursing notes.  Pertinent labs & imaging results that were available during my care of the patient were reviewed by me and considered in my medical decision making (see chart for details).    Patient was seen today for gross hematuria.  Urine is grossly abnormal today.  She has been seeing her PCP and urology for this issue already.  Plans for cysto next month.  I am treating her today with augmentin based on previous urine culture.  I am checking a CBC given the amount of bleeding she is experiencing.  If her h/h has dropped considerably would need to go to the ER or call urology to move up the cysto.   Final Clinical Impressions(s) / UC Diagnoses   Final diagnoses:  Gross hematuria  Lower abdominal pain  Cyst of ovary, unspecified laterality  Urinary tract infection with hematuria, site unspecified     Discharge Instructions  You were seen today for possible UTI.  I am treating you with augmentin twice/day x 10 days;  Take with food to avoid upset stomach.  Your blood work will be resulted later today or tomorrow, and you will be notified of results and next steps if needed.  Please follow up with your urologist as you have been.  Please go to the ER if you have worsening symptoms despite treatment.     ED Prescriptions     Medication Sig Dispense Auth. Provider   amoxicillin-clavulanate (AUGMENTIN) 875-125 MG tablet Take 1 tablet by mouth 2 (two) times daily. 20 tablet Rondel Oh, MD      PDMP not reviewed this encounter.   Rondel Oh, MD 07/04/22 1234

## 2022-07-07 ENCOUNTER — Telehealth (HOSPITAL_COMMUNITY): Payer: Self-pay | Admitting: Emergency Medicine

## 2022-07-07 LAB — URINE CULTURE: Culture: >=100000 — AB

## 2022-07-07 MED ORDER — CIPROFLOXACIN HCL 250 MG PO TABS: 250.0000 mg | ORAL_TABLET | Freq: Two times a day (BID) | ORAL | 0 refills | Status: DC

## 2022-07-08 ENCOUNTER — Telehealth: Payer: Self-pay

## 2022-07-08 NOTE — Telephone Encounter (Signed)
MEDICATION CONCERN:  Pt's brother, Ulice Dash called, stating that pt has recently been to the urgent care and was dx with UTI. Ulice Dash states that pt's urologist will not do pt's cystoscopy if she has an infection. Pt was given abx by Urgent Care, that was verified by culture. Pt's Cystoscopy is scheduled for 07/22/22. Ulice Dash asks if pt can be started on an abx after she finishes this one, to prevent another UTI, so she can have the cystoscopy done?

## 2022-07-10 ENCOUNTER — Other Ambulatory Visit: Payer: Self-pay | Admitting: Family Medicine

## 2022-07-10 MED ORDER — CEPHALEXIN 500 MG PO CAPS: 500.0000 mg | ORAL_CAPSULE | Freq: Every day | ORAL | 0 refills | Status: DC

## 2022-07-11 ENCOUNTER — Other Ambulatory Visit: Payer: Self-pay | Admitting: Family Medicine

## 2022-07-11 MED ORDER — CROMOLYN SODIUM 4 % OP SOLN: OPHTHALMIC | 2 refills | Status: DC

## 2022-07-11 NOTE — Telephone Encounter (Signed)
Received eFax from pharmacy to request refill of or new script for:  cromolyn (OPTICROM) 4 % ophthalmic solution  Dose, Route, Frequency: As Directed  Dispense Quantity: 10 mL Refills: 2        Sig:   INSTILL 1 DROP INTO BOTH EYES 4 TIMES A DAY.  Strength: 4 %        Start Date: 05/22/22 End Date: --  Written Date: 05/22/22 Expiration Date: 05/22/23  Original Order:  cromolyn (OPTICROM) 4 % ophthalmic solution [[id number]     PHARMACY:  [id number] [place] [facility] [address] [place] Phone:  [phone] Fax:  [phone] DEA #:  [id number] LOV: CPE on 06/16/22  Please advise pharmacist at [phone]

## 2022-07-11 NOTE — Telephone Encounter (Signed)
Requested Prescriptions  Pending Prescriptions Disp Refills  . cromolyn (OPTICROM) 4 % ophthalmic solution 10 mL 2    Sig: INSTILL 1 DROP INTO BOTH EYES 4 TIMES A DAY. Strength: 4 %     Ophthalmology:  Antiallergy Passed - 07/11/2022 11:49 AM      Passed - Valid encounter within last 12 months    Recent Outpatient Visits          7 months ago Osteoporosis, unspecified osteoporosis type, unspecified pathological fracture presence   [place] Pickard, Cammie Mcgee, MD   1 year ago Gross hematuria   [place] Dennard Schaumann, Cammie Mcgee, MD   1 year ago General medical exam   [place] Susy Frizzle, MD   2 years ago General medical exam   [place] Susy Frizzle, MD   3 years ago General medical exam   [place] Susy Frizzle, MD      Future Appointments            In 11 months Pickard, Cammie Mcgee, MD [place]

## 2022-07-14 ENCOUNTER — Other Ambulatory Visit: Payer: Self-pay | Admitting: Family Medicine

## 2022-07-14 ENCOUNTER — Ambulatory Visit: Payer: PRIVATE HEALTH INSURANCE

## 2022-07-14 ENCOUNTER — Telehealth: Payer: Self-pay

## 2022-07-14 MED ORDER — FLUCONAZOLE 150 MG PO TABS: ORAL_TABLET | ORAL | 0 refills | Status: DC

## 2022-07-14 NOTE — Telephone Encounter (Signed)
RX FOR DIFLUCAN:  Pt called with c/o vaginal itching since being on antibiotics. Pt asks if an rx for Diflucan could be sent in for her? Thank you.

## 2022-07-16 ENCOUNTER — Other Ambulatory Visit: Payer: Self-pay | Admitting: Family Medicine

## 2022-07-17 NOTE — Telephone Encounter (Signed)
Requested medications are due for refill today.  unsure  Requested medications are on the active medications list.  yes  Last refill. 07/14/2022 #2 0 rf  Future visit scheduled.   yes  Notes to clinic.  Medication not assigned a protocol . Please review.    Requested Prescriptions  Pending Prescriptions Disp Refills   fluconazole (DIFLUCAN) 150 MG tablet [Pharmacy Med Name: FLUCONAZOLE 150 MG TABLET] 2 tablet 0    Sig: 1 TAB AT START OF SYMPTOMS, MAY REPEAT 3 DAYS LATER     Off-Protocol Failed - 07/16/2022  5:26 PM      Failed - Medication not assigned to a protocol, review manually.      Passed - Valid encounter within last 12 months    Recent Outpatient Visits           7 months ago Osteoporosis, unspecified osteoporosis type, unspecified pathological fracture presence   [place] Pickard, Cammie Mcgee, MD   1 year ago Gross hematuria   [place] Dennard Schaumann, Cammie Mcgee, MD   1 year ago General medical exam   [place] Susy Frizzle, MD   2 years ago General medical exam   [place] Susy Frizzle, MD   3 years ago General medical exam   [place] Susy Frizzle, MD       Future Appointments             In 11 months Pickard, Cammie Mcgee, MD [place]

## 2022-07-18 ENCOUNTER — Other Ambulatory Visit: Payer: Self-pay | Admitting: Family Medicine

## 2022-07-18 MED ORDER — CROMOLYN SODIUM 4 % OP SOLN: OPHTHALMIC | 2 refills | Status: DC

## 2022-07-18 NOTE — Telephone Encounter (Signed)
Rx sent by provider marked as print- will try to resend- electronically Requested Prescriptions  Pending Prescriptions Disp Refills   cromolyn (OPTICROM) 4 % ophthalmic solution 10 mL 2    Sig: INSTILL 1 DROP INTO BOTH EYES 4 TIMES A DAY. Strength: 4 %     Ophthalmology:  Antiallergy Passed - 07/18/2022  4:10 PM      Passed - Valid encounter within last 12 months    Recent Outpatient Visits           7 months ago Osteoporosis, unspecified osteoporosis type, unspecified pathological fracture presence   [place] Pickard, Cammie Mcgee, MD   1 year ago Gross hematuria   [place] Dennard Schaumann, Cammie Mcgee, MD   1 year ago General medical exam   [place] Susy Frizzle, MD   2 years ago General medical exam   [place] Susy Frizzle, MD   3 years ago General medical exam   [place] Susy Frizzle, MD       Future Appointments             In 11 months Pickard, Cammie Mcgee, MD [place]

## 2022-07-18 NOTE — Telephone Encounter (Signed)
  Prescription Request  07/18/2022  Is this a "Controlled Substance" medicine? No  LOV: 07/18/2022   What is the name of the medication or equipment?   cromolyn (OPTICROM) 4 % ophthalmic solution [[id number]]   Have you contacted your pharmacy to request a refill? Yes   Which pharmacy would you like this sent to?    [id number]: [phone]: [phone] Patient notified that their request is being sent to the clinical staff for review and that they should receive a response within 2 business days.   Please advise pharmacist at [phone]

## 2022-07-18 NOTE — Telephone Encounter (Signed)
Requested Prescriptions  Pending Prescriptions Disp Refills   fluconazole (DIFLUCAN) 150 MG tablet [Pharmacy Med Name: FLUCONAZOLE 150 MG TABLET] 2 tablet 0    Sig: 1 TAB AT START OF SYMPTOMS, MAY REPEAT 3 DAYS LATER     Off-Protocol Failed - 07/18/2022  2:31 PM      Failed - Medication not assigned to a protocol, review manually.      Passed - Valid encounter within last 12 months    Recent Outpatient Visits           7 months ago Osteoporosis, unspecified osteoporosis type, unspecified pathological fracture presence   [place] Pickard, Cammie Mcgee, MD   1 year ago Gross hematuria   [place] Dennard Schaumann, Cammie Mcgee, MD   1 year ago General medical exam   [place] Susy Frizzle, MD   2 years ago General medical exam   [place] Susy Frizzle, MD   3 years ago General medical exam   [place] Susy Frizzle, MD       Future Appointments             In 11 months Pickard, Cammie Mcgee, MD [place]

## 2022-07-21 ENCOUNTER — Ambulatory Visit
Admission: RE | Admit: 2022-07-21 | Discharge: 2022-07-21 | Disposition: A | Discharge status: Home / Self Care | Payer: PRIVATE HEALTH INSURANCE | Source: Ambulatory Visit | Attending: Family Medicine | Admitting: Family Medicine

## 2022-07-21 ENCOUNTER — Other Ambulatory Visit: Payer: Self-pay

## 2022-07-21 DIAGNOSIS — Z1231 Encounter for screening mammogram for malignant neoplasm of breast: Secondary | ICD-10-CM

## 2022-07-28 ENCOUNTER — Telehealth: Payer: Self-pay

## 2022-07-28 NOTE — Telephone Encounter (Signed)
Pt's brother Ulice Dash called and states pt was seen by Urology last Tuesday and was advised that the ovarian cyst had increased from 2 cm to 5 cm. Ulice Dash states pt was advised to see and GYN to possibly have cyst removed due to it pressing on her bladder. Ulice Dash asks if you could review the Urology notes and advise him what is the best course of action? Thank you.

## 2022-07-29 ENCOUNTER — Other Ambulatory Visit: Payer: Self-pay | Admitting: Family Medicine

## 2022-07-29 DIAGNOSIS — N83209 Unspecified ovarian cyst, unspecified side: Secondary | ICD-10-CM

## 2022-07-31 ENCOUNTER — Other Ambulatory Visit: Payer: Self-pay | Admitting: Family Medicine

## 2022-07-31 NOTE — Telephone Encounter (Signed)
Refused Flonase Nasal Spray because requested too soon.

## 2022-07-31 NOTE — Telephone Encounter (Signed)
Requested Prescriptions  Pending Prescriptions Disp Refills   gabapentin (NEURONTIN) 300 MG capsule [Pharmacy Med Name: GABAPENTIN 300 MG CAPSULE] 90 capsule 3    Sig: TAKE 1 CAPSULE BY MOUTH THREE TIMES A DAY     Neurology: Anticonvulsants - gabapentin Passed - 07/31/2022  1:08 PM      Passed - Cr in normal range and within 360 days    Creat  Date Value Ref Range Status  06/03/2021 0.76 0.50 - 1.05 mg/dL Final   Creatinine, Ser  Date Value Ref Range Status  06/05/2022 0.90 0.44 - 1.00 mg/dL Final         Passed - Completed PHQ-2 or PHQ-9 in the last 360 days      Passed - Valid encounter within last 12 months    Recent Outpatient Visits           8 months ago Osteoporosis, unspecified osteoporosis type, unspecified pathological fracture presence   [place] Susy Frizzle, MD   1 year ago Gross hematuria   [place] Dennard Schaumann, Cammie Mcgee, MD   1 year ago General medical exam   [place] Susy Frizzle, MD   2 years ago General medical exam   [place] Susy Frizzle, MD   3 years ago General medical exam   [place] Susy Frizzle, MD       Future Appointments             In 10 months Pickard, Cammie Mcgee, MD [place]

## 2022-08-12 ENCOUNTER — Telehealth: Payer: Self-pay

## 2022-08-12 MED ORDER — FEXOFENADINE HCL 180 MG PO TABS: 180.0000 mg | ORAL_TABLET | Freq: Every day | ORAL | 3 refills | Status: DC

## 2022-08-12 NOTE — Telephone Encounter (Signed)
  Prescription Request  08/12/2022  Is this a "Controlled Substance" medicine? No  LOV: 07/31/2022   What is the name of the medication or equipment? fexofenadine (ALLEGRA) 180 MG   Have you contacted your pharmacy to request a refill? Yes   Which pharmacy would you like this sent to?  [id number]: [phone]: [phone]  Patient notified that their request is being sent to the clinical staff for review and that they should receive a response within 2 business days.   Please advise at [phone]

## 2022-08-14 ENCOUNTER — Telehealth: Payer: Self-pay | Admitting: Obstetrics & Gynecology

## 2022-08-14 ENCOUNTER — Encounter: Payer: Self-pay | Admitting: Obstetrics & Gynecology

## 2022-08-14 ENCOUNTER — Ambulatory Visit (INDEPENDENT_AMBULATORY_CARE_PROVIDER_SITE_OTHER): Payer: Medicaid Other | Admitting: Obstetrics & Gynecology

## 2022-08-14 VITALS — BP 125/70 | HR 67 | Ht 62.5 in | Wt 115.0 lb

## 2022-08-14 DIAGNOSIS — R102 Pelvic and perineal pain: Secondary | ICD-10-CM

## 2022-08-14 DIAGNOSIS — R300 Dysuria: Secondary | ICD-10-CM | POA: Diagnosis not present

## 2022-08-14 DIAGNOSIS — N83201 Unspecified ovarian cyst, right side: Secondary | ICD-10-CM

## 2022-08-14 LAB — POCT URINALYSIS DIPSTICK OB
Glucose, UA: NEGATIVE
Ketones, UA: NEGATIVE
Nitrite, UA: NEGATIVE

## 2022-08-14 NOTE — Telephone Encounter (Signed)
I called patient to let her know about her ultrasound date/time at [facility]. She asked about her labs/medicines. I informed her that the labs hadn't been released yet and medicine would be called in when it comes in. She informed me that she would also need a rx for diflucan as she gets a bad yeast infection when she takes antibiotics. Please advise.

## 2022-08-14 NOTE — Progress Notes (Signed)
GYN VISIT Patient name: Heather Gillespie MRN [id number]  Date of birth: 1961-04-03 Chief Complaint:   Ovarian Cyst and Dysuria  History of Present Illness:   Heather Gillespie is a 60 y.o. G0P0000 PM female being seen today for the following concerns  -Ovarian cyst: Pt sent due to ovarian cyst on abdominal CT.  She reports pain in mid-abdomen, describes the pain as a pressure.  Rates her pain as a 10/10 with increased bloating.  This has been going on for the past 8 mos.  Notes that she "stays" nauseous with occasional vomiting.  Pt point to mid to upper abdomen as the source of her pain.  Of note, she has struggled with urinary and GI issues.  Has known h/o recurrent UTIs- followed by urology.  Per brother, she has will be ok while being treated with an antibiotics, but almost as soon as she goes off the UTI returns.  It is unclear as to why she is not on antibiotic progphylaxis  She has also struggled with GI issues with chronic rectal bleeding, IBS and pancreatitis.   Records reviewed- Abd CT: 05/2022: Simple cyst 4.6x4.1cm in size.  No free fluid.  Per pt recent imaging completed by urology that showed enlargement of the cyst.  No LMP recorded. Patient is postmenopausal.     08/14/2022    1:45 PM 06/16/2022   10:47 AM 06/03/2021    9:04 AM 05/31/2020    9:06 AM 05/30/2019    9:26 AM  Depression screen PHQ 2/9  Decreased Interest 0 0 0 0 0  Down, Depressed, Hopeless 0 0 0 0 0  PHQ - 2 Score 0 0 0 0 0  Altered sleeping 0      Tired, decreased energy 0      Change in appetite 0      Feeling bad or failure about yourself  0      Trouble concentrating 0      Moving slowly or fidgety/restless 0      Suicidal thoughts 0      PHQ-9 Score 0         Review of Systems:   Pertinent items are noted in HPI  Pertinent History Reviewed:  Reviewed past medical,surgical, social, obstetrical and family history.  Reviewed problem list, medications and allergies. Physical Assessment:    Vitals:   08/14/22 1340  BP: 125/70  Pulse: 67  Weight: 115 lb (52.2 kg)  Height: 5' 2.5" (1.588 m)  Body mass index is 20.7 kg/m.       Physical Examination:   General appearance: alert, well appearing, and in no distress  Psych: mood appropriate, normal affect  Skin: warm & dry   Cardiovascular: normal heart rate noted  Respiratory: normal respiratory effort, no distress  Abdomen: flat, soft, +tenderness mid abdomen- no rebound, no guarding   Pelvic: VULVA: normal appearing vulva with no masses, tenderness or lesions, VAGINA: narrowed introitus, normal appearing vagina with normal color and discharge, no lesions, CERVIX: normal appearing cervix without discharge or lesions, UTERUS: uterus is normal size, shape, consistency and nontender, ADNEXA: normal adnexa in size and no masses.  Diffuse tenderness on exam- but not discrete mass appreciated  Extremities: no edema, no calf tenderness  Chaperone:  Lawyer     Assessment & Plan:  1) Right ovarian cyst -plan for pelvic US for further evaluation -reviewed findings of CT- discussed that a simple 4cm cyst would not require surgical intervention and could be followed conservatively -  based on history and exam, pain does not seem ovarian in nature  2) Recurrent UTI -suspect pt has another UTI- urine dark brown with sediment -plan for treatment and will review with urology potential for continue antibiotic treatment   Orders Placed This Encounter  Procedures   Urine Culture   US PELVIC COMPLETE WITH TRANSVAGINAL   Urinalysis, Routine w reflex microscopic   POC Urinalysis Dipstick OB    Return for '[]'$  records from urology, pelvic US next availabe (AP ok).   Janyth Pupa, DO Attending [place], [facility] for [employer], [place]

## 2022-08-15 ENCOUNTER — Other Ambulatory Visit: Payer: Self-pay | Admitting: Family Medicine

## 2022-08-16 ENCOUNTER — Other Ambulatory Visit: Payer: Self-pay | Admitting: Obstetrics & Gynecology

## 2022-08-16 DIAGNOSIS — R300 Dysuria: Secondary | ICD-10-CM

## 2022-08-16 DIAGNOSIS — N3001 Acute cystitis with hematuria: Secondary | ICD-10-CM

## 2022-08-16 LAB — URINALYSIS, ROUTINE W REFLEX MICROSCOPIC: Specific Gravity, UA: 1.022 (ref 1.005–1.030)

## 2022-08-16 LAB — MICROSCOPIC EXAMINATION
Epithelial Cells (non renal): >10 /hpf — AB (ref 0–10)
RBC, Urine: NONE SEEN /hpf (ref 0–2)
WBC, UA: NONE SEEN /hpf (ref 0–5)

## 2022-08-16 LAB — URINE CULTURE

## 2022-08-16 NOTE — Progress Notes (Unsigned)
Findings concerning for a UTI- prescription sent in

## 2022-08-18 ENCOUNTER — Other Ambulatory Visit: Payer: Self-pay | Admitting: Obstetrics & Gynecology

## 2022-08-18 DIAGNOSIS — N3001 Acute cystitis with hematuria: Secondary | ICD-10-CM

## 2022-08-18 MED ORDER — FLUCONAZOLE 150 MG PO TABS: ORAL_TABLET | ORAL | 0 refills | Status: DC

## 2022-08-18 MED ORDER — NITROFURANTOIN MONOHYD MACRO 100 MG PO CAPS: 100.0000 mg | ORAL_CAPSULE | Freq: Two times a day (BID) | ORAL | 0 refills | Status: AC

## 2022-08-18 NOTE — Progress Notes (Signed)
Macrobid sent in for UTI along with Diflucan as pt reports recurrent yeast s/p UTi treatment

## 2022-08-22 ENCOUNTER — Other Ambulatory Visit: Payer: Self-pay

## 2022-08-22 ENCOUNTER — Ambulatory Visit (HOSPITAL_COMMUNITY)
Admission: RE | Admit: 2022-08-22 | Discharge: 2022-08-22 | Disposition: A | Discharge status: Home / Self Care | Payer: Medicaid Other | Source: Ambulatory Visit | Attending: Obstetrics & Gynecology | Admitting: Obstetrics & Gynecology

## 2022-08-22 DIAGNOSIS — N83201 Unspecified ovarian cyst, right side: Secondary | ICD-10-CM

## 2022-08-22 MED ORDER — HYDROCHLOROTHIAZIDE 25 MG PO TABS: 25.0000 mg | ORAL_TABLET | Freq: Every day | ORAL | 1 refills | Status: DC

## 2022-08-22 NOTE — Telephone Encounter (Signed)
Requested Prescriptions  Pending Prescriptions Disp Refills   hydrochlorothiazide (HYDRODIURIL) 25 MG tablet 90 tablet 1    Sig: Take 1 tablet (25 mg total) by mouth daily.     Cardiovascular: Diuretics - Thiazide Failed - 08/22/2022 10:42 AM      Failed - K in normal range and within 180 days    Potassium  Date Value Ref Range Status  06/05/2022 3.2 (L) 3.5 - 5.1 mmol/L Final         Failed - Valid encounter within last 6 months    Recent Outpatient Visits           8 months ago Osteoporosis, unspecified osteoporosis type, unspecified pathological fracture presence   [place] Pickard, Cammie Mcgee, MD   1 year ago Gross hematuria   [place] Pickard, Cammie Mcgee, MD   1 year ago General medical exam   [place] Susy Frizzle, MD   2 years ago General medical exam   [place] Susy Frizzle, MD   3 years ago General medical exam   [place] Susy Frizzle, MD       Future Appointments             In 10 months Pickard, Cammie Mcgee, MD [place]            Passed - Cr in normal range and within 180 days    Creat  Date Value Ref Range Status  06/03/2021 0.76 0.50 - 1.05 mg/dL Final   Creatinine, Ser  Date Value Ref Range Status  06/05/2022 0.90 0.44 - 1.00 mg/dL Final         Passed - Na in normal range and within 180 days    Sodium  Date Value Ref Range Status  06/05/2022 138 135 - 145 mmol/L Final         Passed - Last BP in normal range    BP Readings from Last 1 Encounters:  08/14/22 125/70

## 2022-08-22 NOTE — Telephone Encounter (Signed)
Prescription Request  08/22/2022  Is this a "Controlled Substance" medicine? No  LOV: 06/16/22  What is the name of the medication or equipment? hydrochlorothiazide (HYDRODIURIL) 25 MG tablet   Have you contacted your pharmacy to request a refill? Yes   Which pharmacy would you like this sent to?  [id number] [id number]: [phone]: [phone]   Patient notified that their request is being sent to the clinical staff for review and that they should receive a response within 2 business days.   Please advise at [phone]

## 2022-08-23 ENCOUNTER — Ambulatory Visit (HOSPITAL_COMMUNITY)
Admission: EM | Admit: 2022-08-23 | Discharge: 2022-08-23 | Disposition: A | Discharge status: Home / Self Care | Payer: Medicaid Other | Attending: Emergency Medicine | Admitting: Emergency Medicine

## 2022-08-23 ENCOUNTER — Encounter (HOSPITAL_COMMUNITY): Payer: Self-pay | Admitting: *Deleted

## 2022-08-23 DIAGNOSIS — N3001 Acute cystitis with hematuria: Secondary | ICD-10-CM | POA: Diagnosis not present

## 2022-08-23 HISTORY — DX: Unspecified ovarian cyst, unspecified side: N83.209

## 2022-08-23 LAB — CBC WITH DIFFERENTIAL/PLATELET
Abs Immature Granulocytes: 0.01 10*3/uL (ref 0.00–0.07)
Basophils Absolute: 0 10*3/uL (ref 0.0–0.1)
Basophils Relative: 1 %
Eosinophils Absolute: 0.1 10*3/uL (ref 0.0–0.5)
Eosinophils Relative: 2 %
HCT: 33.4 % — ABNORMAL LOW (ref 36.0–46.0)
Hemoglobin: 11.8 g/dL — ABNORMAL LOW (ref 12.0–15.0)
Immature Granulocytes: 0 %
Lymphocytes Relative: 42 %
Lymphs Abs: 1.7 10*3/uL (ref 0.7–4.0)
MCH: 31.3 pg (ref 26.0–34.0)
MCHC: 35.3 g/dL (ref 30.0–36.0)
MCV: 88.6 fL (ref 80.0–100.0)
Monocytes Absolute: 0.3 10*3/uL (ref 0.1–1.0)
Monocytes Relative: 8 %
Neutro Abs: 1.8 10*3/uL (ref 1.7–7.7)
Neutrophils Relative %: 47 %
Platelets: 195 10*3/uL (ref 150–400)
RBC: 3.77 MIL/uL — ABNORMAL LOW (ref 3.87–5.11)
RDW: 12.7 % (ref 11.5–15.5)
WBC: 4 10*3/uL (ref 4.0–10.5)
nRBC: 0 % (ref 0.0–0.2)

## 2022-08-23 LAB — POCT URINALYSIS DIPSTICK, ED / UC
Glucose, UA: NEGATIVE mg/dL
Nitrite: POSITIVE — AB
Protein, ur: >=300 mg/dL — AB
Specific Gravity, Urine: 1.02 (ref 1.005–1.030)
Urobilinogen, UA: 2 mg/dL — ABNORMAL HIGH (ref 0.0–1.0)
pH: 5.5 (ref 5.0–8.0)

## 2022-08-23 MED ORDER — LEVOFLOXACIN 500 MG PO TABS: 500.0000 mg | ORAL_TABLET | Freq: Every day | ORAL | 0 refills | Status: DC

## 2022-08-23 NOTE — ED Triage Notes (Addendum)
C/O dysuria, polyuria, hematuria onset approx 8 months ago - states has been treated with many rounds of antibiotics, but continues to have problems, and now UTI sxs are worse. Pt currently taking Macrobid starting 5 days ago and was told by GYN to get seen if her sxs are getting any worse. Pt states she had vaginal US done yesterday for ovarian cyst; has been having vaginal bleeding "for years". Sister states today pt had an episode where she seemed disoriented in grocery store today, which has resolved.

## 2022-08-23 NOTE — Discharge Instructions (Addendum)
Urinalysis did show blood within it however as you recently completed antibiotics and there are significant blood within the urine results are skewed   As you are still symptomatic begin use of Levaquin every morning for 7 days to continue to treat infection and get rid of bacteria  As you have been bleeding persistently we have obtained lab work to check your blood levels, this may take a few hours to return, you will be notified of any concerning values and told how to move forward  At any point if your symptoms worsen or you begin to experience fevers please go to the nearest emergency department for evaluation for IV medication  If your symptoms continue to persist but do not worsen please follow-up with your neurologist for further management

## 2022-08-23 NOTE — ED Provider Notes (Signed)
[facility]    CSN: [id number] Arrival date & time: 08/23/22  1617      History   Chief Complaint Chief Complaint  Patient presents with   Dysuria    HPI Heather Gillespie is a 61 y.o. female.   Patient presents for intermittent dysuria, polyuria and hematuria occurring for approximately 8 months.  Was recently evaluated by her OB/GYN, diagnosed with cyst Heather Gillespie, completed last dose of Macrobid last night.  Endorses no improvement with symptoms and was notified by her OB/GYN that if her symptoms continue to persist that she would need additional antibiotics.  Completed a vaginal ultrasound 1 day ago for an ovarian cyst as she has had vaginal bleeding for years.  Denies fevers, lower abdominal pain or pressure, flank pain.  Also followed by urology, endorses upcoming appointment in February 2024.  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 05/26/2013   Vitamin D deficiency    TMJ (dislocation of temporomandibular joint)    Osteoporosis    Rectal pain 03/31/2011   ANEMIA, IRON DEFICIENCY NOS 02/19/2007   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   HEMORRHOIDS NOS, W/O COMPLICATIONS 19/41/7408   RHINITIS, ALLERGIC NOS 02/19/2007   IBS 02/19/2007   DIARRHEA, CHRONIC 02/19/2007   UTI (urinary tract infection) 02/19/2007    Past Surgical History:  Procedure Laterality Date   ANAL FISSURE REPAIR     BACK SURGERY      CHOLECYSTECTOMY  1984   COLONOSCOPY W/ POLYPECTOMY     colon ulcers   EYE SURGERY     FOOT SURGERY     HEMI-MICRODISCECTOMY LUMBAR LAMINECTOMY LEVEL 1 Left 05/26/2013   Procedure: HEMI-MICRODISCECTOMY LUMBAR LAMINECTOMY L4 - L5 ON THE LEFT LEVEL 1;  Surgeon: Tobi Bastos, MD;  Location: [facility];  Service: Orthopedics;  Laterality: Left;   HEMORROIDECTOMY      OB History     Gravida  0   Para  0   Term  0   Preterm  0   AB  0   Living  0      SAB  0   IAB  0   Ectopic  0   Multiple  0   Live Births  0            Home Medications    Prior to Admission medications   Medication Sig Start Date End Date Taking? Authorizing Provider  alendronate (FOSAMAX) 70 MG tablet TAKE 1 TABLET BY MOUTH EVERY 7 DAYS. TAKE WITH A FULL GLASS OF WATER ON AN EMPTY STOMACH. 05/26/22  Yes Susy Frizzle, MD  cromolyn (OPTICROM) 4 % ophthalmic solution INSTILL 1 DROP INTO BOTH EYES 4 TIMES A DAY. Strength: 4 % 07/18/22  Yes Pickard, Cammie Mcgee, MD  dexlansoprazole (DEXILANT) 60 MG capsule Take 60 mg by mouth daily.  Yes [provider]  Ergocalciferol (VITAMIN D2) 2000 units TABS Take 1 tablet by mouth daily.   Yes [provider]  fexofenadine (ALLEGRA) 180 MG tablet Take 1 tablet (180 mg total) by mouth daily. 08/12/22  Yes Susy Frizzle, MD  fluticasone (FLONASE) 50 MCG/ACT nasal spray SPRAY 2 SPRAYS INTO EACH NOSTRIL EVERY DAY 08/15/22  Yes Susy Frizzle, MD  gabapentin (NEURONTIN) 300 MG capsule TAKE 1 CAPSULE BY MOUTH THREE TIMES A DAY 07/31/22  Yes Susy Frizzle, MD  hydrochlorothiazide (HYDRODIURIL) 25 MG tablet Take 1 tablet (25 mg total) by mouth daily. 08/22/22  Yes Susy Frizzle, MD  ibuprofen (ADVIL) 800 MG tablet Take 1 tablet (800 mg total) by mouth 3 (three) times daily. 06/22/22  Yes Raspet, Erin K, PA-C  levofloxacin (LEVAQUIN) 500 MG tablet Take 1 tablet (500 mg total) by mouth daily. 08/23/22  Yes Cleaven Demario R, NP  losartan  (COZAAR) 100 MG tablet Take 1 tablet (100 mg total) by mouth daily. 06/10/22  Yes Susy Frizzle, MD  nitrofurantoin, macrocrystal-monohydrate, (MACROBID) 100 MG capsule Take 1 capsule (100 mg total) by mouth 2 (two) times daily for 7 days. 08/18/22 08/25/22 Yes Janyth Pupa, DO  fluconazole (DIFLUCAN) 150 MG tablet 1 TAB AT START OF SYMPTOMS, MAY REPEAT 3 DAYS LATER 08/18/22   Janyth Pupa, DO  methocarbamol (ROBAXIN) 500 MG tablet Take 1 tablet (500 mg total) by mouth 2 (two) times daily. 06/22/22   Raspet, Erin K, PA-C  ondansetron (ZOFRAN) 4 MG tablet Take 1 tablet (4 mg total) by mouth every 6 (six) hours. 05/25/22   Jacqlyn Larsen, PA-C  PATADAY 0.7 % SOLN INSTILL 1 DROP INTO BOTH EYES EVERY DAY 10/15/21   Susy Frizzle, MD  potassium chloride SA (KLOR-CON M) 20 MEQ tablet Take 1 tablet (20 mEq total) by mouth daily. 06/05/22   Domenic Moras, PA-C  Probiotic, Lactobacillus, CAPS Take 1 capsule by mouth daily. 05/25/22   Jacqlyn Larsen, PA-C  promethazine (PHENERGAN) 25 MG tablet Take 1 tablet (25 mg total) by mouth every 6 (six) hours as needed for nausea or vomiting. 06/05/22   Domenic Moras, PA-C    Family History Family History  Problem Relation Age of Onset   Diabetes Mother    Cancer Mother    Stroke Father    Breast cancer Neg Hx     Social History Social History   Tobacco Use   Smoking status: Never   Smokeless tobacco: Never  Vaping Use   Vaping Use: Never used  Substance Use Topics   Alcohol use: No   Drug use: No     Allergies   Hydroxyzine, Lotronex [alosetron hcl], Other, and Hydroxyzine pamoate   Review of Systems Review of Systems Defer to hpi   Physical Exam Triage Vital Signs ED Triage Vitals  Enc Vitals Group     BP 08/23/22 1658 138/78     Pulse Rate 08/23/22 1658 68     Resp 08/23/22 1658 18     Temp 08/23/22 1658 98.7 F (37.1 C)     Temp Source 08/23/22 1658 Oral     SpO2 08/23/22 1658 100 %     Weight --      Height --      Head  Circumference --      Peak Flow --      Pain Score 08/23/22 1702 10     Pain Loc --      Pain Edu? --  Excl. in GC? --    No data found.  Updated Vital Signs BP 138/78   Pulse 68   Temp 98.7 F (37.1 C) (Oral)   Resp 18   SpO2 100%   Visual Acuity Right Eye Distance:   Left Eye Distance:   Bilateral Distance:    Right Eye Near:   Left Eye Near:    Bilateral Near:     Physical Exam Constitutional:      Appearance: Normal appearance.  HENT:     Head: Normocephalic.  Eyes:     Extraocular Movements: Extraocular movements intact.  Pulmonary:     Effort: Pulmonary effort is normal.  Abdominal:     General: Abdomen is flat. Bowel sounds are normal.     Palpations: Abdomen is soft.  Neurological:     Mental Status: She is alert and oriented to person, place, and time. Mental status is at baseline.      UC Treatments / Results  Labs (all labs ordered are listed, but only abnormal results are displayed) Labs Reviewed  POCT URINALYSIS DIPSTICK, ED / UC - Abnormal; Notable for the following components:      Result Value   Bilirubin Urine MODERATE (*)    Ketones, ur TRACE (*)    Hgb urine dipstick LARGE (*)    Protein, ur >=300 (*)    Urobilinogen, UA 2.0 (*)    Nitrite POSITIVE (*)    Leukocytes,Ua TRACE (*)    All other components within normal limits  URINE CULTURE  CBC WITH DIFFERENTIAL/PLATELET    EKG   Radiology US PELVIC COMPLETE WITH TRANSVAGINAL  Result Date: 08/22/2022 CLINICAL DATA:  Mid abdominal pain for 8 months, postmenopausal EXAM: TRANSABDOMINAL AND TRANSVAGINAL ULTRASOUND OF PELVIS TECHNIQUE: Both transabdominal and transvaginal ultrasound examinations of the pelvis were performed. Transabdominal technique was performed for global imaging of the pelvis including uterus, ovaries, adnexal regions, and pelvic cul-de-sac. It was necessary to proceed with endovaginal exam following the transabdominal exam to visualize the endometrium and LEFT  ovary. COMPARISON:  CT abdomen and pelvis 05/25/2022 FINDINGS: Uterus Measurements: 5.4 x 2.1 x 4.1 cm = volume: 23 mL. Anteverted. Atrophic. No focal mass. Endometrium Thickness: 5 mm. Small amount of nonspecific endometrial fluid. Heterogeneous and focally thickened at fundal portion of endometrial complex, cannot exclude small mass 36m. Additional 6 mm nodule mid uterus. Right ovary Measurements: 5.0 x 4.2 x 4.5 cm = volume: 49 mL. Simple cyst RIGHT ovary 4.6 cm greatest size, without wall thickening, irregularity, septations or nodularity. This appears stable since prior CT exam. Recommend followup [facility] 3-6 months. Note: This recommendation does not apply to premenarchal patients or to those with increased risk (genetic, family history, elevated tumor markers or other high-risk factors) of ovarian cancer. Reference: Radiology 2019 Nov; 293(2):359-371. Left ovary Measurements: 1.1 x 1.8 x 0.9 cm = volume: 0.9 mL. Normal morphology without mass Other findings No free pelvic fluid or adnexal masses. IMPRESSION: 4.6 cm diameter simple cyst RIGHT ovary, stable in size since prior CT exam. Recommend follow-up [facility] 3-6 months. Small amount of nonspecific endometrial fluid. Thickened heterogeneous endometrial complex with question two small polyps measuring 8 mm and 6 mm in greatest sizes versus abnormal endometrial thickening; tissue diagnosis recommended to exclude malignancy. These results will be called to the ordering clinician or representative by the Radiologist Assistant, and communication documented in the PACS or [employer] Electronically Signed   By: MLavonia DanaM.D.   On: 08/22/2022 12:55  Procedures Procedures (including critical care time)  Medications Ordered in UC Medications - No data to display  Initial Impression / Assessment and Plan / UC Course  I have reviewed the triage vital signs and the nursing notes.  Pertinent labs & imaging results that were available during my care of  the patient were reviewed by me and considered in my medical decision making (see chart for details).  Acute cystitis with hematuria  As patient completed antibiotic dose last night and blood is within the urine, urinalysis results have been skewed however if she is symptomatic and no improvement seen with Macrobid placed on Levaquin for 7 days, patient endorses bleeding for months with labs blood work completed in September 2023, CBC with differential completed in office, pending, given strict precautions that if symptoms worsen she is to go to the nearest emergency department for evaluation of IV antibiotics and if her symptoms persist she is to notify her urologist and schedule earlier appointment for management, verbalized understanding Final Clinical Impressions(s) / UC Diagnoses   Final diagnoses:  Acute cystitis with hematuria     Discharge Instructions      Urinalysis did show blood within it however as you recently completed antibiotics and there are significant blood within the urine results are skewed   As you are still symptomatic begin use of Levaquin every morning for 7 days to continue to treat infection and get rid of bacteria  As you have been bleeding persistently we have obtained lab work to check your blood levels, this may take a few hours to return, you will be notified of any concerning values and told how to move forward  At any point if your symptoms worsen or you begin to experience fevers please go to the nearest emergency department for evaluation for IV medication  If your symptoms continue to persist but do not worsen please follow-up with your neurologist for further management   ED Prescriptions     Medication Sig Dispense Auth. Provider   levofloxacin (LEVAQUIN) 500 MG tablet Take 1 tablet (500 mg total) by mouth daily. 7 tablet Marik Sedore, Leitha Schuller, NP      PDMP not reviewed this encounter.   Hans Eden, NP 08/23/22 1759

## 2022-08-24 LAB — URINE CULTURE

## 2022-08-25 ENCOUNTER — Telehealth: Payer: Self-pay

## 2022-08-25 NOTE — Telephone Encounter (Signed)
Pt's brother, Ulice Dash, who is on [facility], called asking if pt can be on a medication long term to treat recurrent UTI's?  Pt was seen again in the Urgent Care yesterday with UTI per Ulice Dash.  Note from Urgent Care:\ As patient completed antibiotic dose last night and blood is within the urine, urinalysis results have been skewed however if she is symptomatic and no improvement seen with Macrobid placed on Levaquin for 7 days, patient endorses bleeding for months with labs blood work completed in September 2023, CBC with differential completed in office, pending, given strict precautions that if symptoms worsen she is to go to the nearest emergency department for evaluation of IV antibiotics and if her symptoms persist she is to notify her urologist and schedule earlier appointment for management, verbalized understanding

## 2022-08-28 ENCOUNTER — Telehealth: Payer: Self-pay

## 2022-08-28 NOTE — Telephone Encounter (Signed)
Heather Gillespie-Social Work w/[facility] called stating that the Urgent Care Nurse told pt that she will have to be on a Gillespie-term antibiotics for what she is going through now.    Pls advice

## 2022-08-28 NOTE — Telephone Encounter (Signed)
Tried to call [employer], no answered. LVM for return called.

## 2022-08-29 ENCOUNTER — Telehealth: Payer: Self-pay

## 2022-08-29 ENCOUNTER — Other Ambulatory Visit: Payer: Self-pay | Admitting: Family Medicine

## 2022-08-29 NOTE — Telephone Encounter (Signed)
Patient would like to know her test results.

## 2022-08-29 NOTE — Telephone Encounter (Signed)
Med dc'd 08/15/22 by Colman Cater CMA  Requested Prescriptions  Refused Prescriptions Disp Refills   cephALEXin (KEFLEX) 500 MG capsule [Pharmacy Med Name: CEPHALEXIN 500 MG CAPSULE] 14 capsule 0    Sig: TAKE 1 CAPSULE (500 MG TOTAL) BY MOUTH DAILY     Off-Protocol Failed - 08/29/2022  6:29 AM      Failed - Medication not assigned to a protocol, review manually.      Passed - Valid encounter within last 12 months    Recent Outpatient Visits           9 months ago Osteoporosis, unspecified osteoporosis type, unspecified pathological fracture presence   [place] Pickard, Cammie Mcgee, MD   1 year ago Gross hematuria   [place] Schaumann, Cammie Mcgee, MD   1 year ago General medical exam   [place] Susy Frizzle, MD   2 years ago General medical exam   [place] Susy Frizzle, MD   3 years ago General medical exam   [place] Susy Frizzle, MD       Future Appointments             In 9 months Pickard, Cammie Mcgee, MD [place]

## 2022-08-29 NOTE — Telephone Encounter (Signed)
Pt hasn't heard anything from Dr. Nelda Marseille about her ultrasound from last Friday. Advised I would send a message to Dr. Nelda Marseille and we would be back in touch.

## 2022-09-01 ENCOUNTER — Encounter: Payer: Self-pay | Admitting: Family Medicine

## 2022-09-01 ENCOUNTER — Ambulatory Visit (INDEPENDENT_AMBULATORY_CARE_PROVIDER_SITE_OTHER): Payer: Medicaid Other | Admitting: Family Medicine

## 2022-09-01 VITALS — BP 120/64 | HR 74 | Ht 62.5 in | Wt 111.0 lb

## 2022-09-01 DIAGNOSIS — N390 Urinary tract infection, site not specified: Secondary | ICD-10-CM | POA: Diagnosis not present

## 2022-09-01 DIAGNOSIS — N3001 Acute cystitis with hematuria: Secondary | ICD-10-CM | POA: Diagnosis not present

## 2022-09-01 DIAGNOSIS — R310 Gross hematuria: Secondary | ICD-10-CM

## 2022-09-01 MED ORDER — SULFAMETHOXAZOLE-TRIMETHOPRIM 800-160 MG PO TABS: 1.0000 | ORAL_TABLET | Freq: Every day | ORAL | 11 refills | Status: DC

## 2022-09-01 MED ORDER — FLUCONAZOLE 150 MG PO TABS: 150.0000 mg | ORAL_TABLET | Freq: Once | ORAL | 0 refills | Status: AC

## 2022-09-01 NOTE — Progress Notes (Signed)
Subjective:    Patient ID: Heather Gillespie, female    DOB: 1961/04/04, 61 y.o.   MRN: [id number]  Urinary Tract Infection     04/21/22 Patient has a very complex past medical history.  She reports that she has had numerous surgeries due to hemorrhoids.  She states that she has had over 20 surgeries in the past for this.  She states that recently she has been having copious amounts of rectal bleeding.  She has a picture today of a waste paper basket filled with toilet paper and soaked in bright red blood.  She states this is been going on now for almost 2 weeks.  She went to an urgent care on July 21 and was diagnosed with a urinary tract infection.  Apparently this was because of hematuria and an abnormal urinalysis.  However now the patient states that she was having copious rectal bleeding when she gave a urine sample.  Urine culture showed multiple species most likely contamination.  She then went to the emergency room on August 2.  She went to the emergency room due to gross hematuria.  Urinalysis showed 3+ hemoglobin.  She had an CT scan: FINDINGS: Lower chest: No acute abnormality.   Hepatobiliary: No suspicious focal liver abnormality is seen. Cholecystectomy. No biliary ductal dilation.   Pancreas: Unremarkable. No pancreatic ductal dilatation or surrounding inflammatory changes.   Spleen: Normal in size without focal abnormality.   Adrenals/Urinary Tract: Adrenal glands are unremarkable. Kidneys are normal, without renal calculi, suspicious focal lesion, or hydronephrosis. Bladder is unremarkable.   Stomach/Bowel: Stomach is within normal limits. The appendix is normal. No evidence of bowel wall thickening, distention, or inflammatory changes.   Vascular/Lymphatic: Mild aortic atherosclerosis. No enlarged abdominal or pelvic lymph nodes.   Reproductive: 4.8 cm cystic lesion in the right adnexa with central homogeneous low fluid attenuation.   Other: No free  intraperitoneal fluid or air.   Musculoskeletal: No acute or significant osseous findings. She is here today for follow-up.  Again she has +3 blood on urinalysis.  She has positive nitrates, negative leukocyte esterase.  She denies any dysuria.  She has been on Bactrim as well as Keflex.  She states that she is having blood come out of her rectum as well.  I performed a rectal exam today.  External rectal exam shows no external hemorrhoids or fissures or skin tears or visible blood.  I am unable to perform an internal exam.  She had a colonoscopy last year that showed angiodysplasia inside the rectum.  She also had several polyps.  At that time there were no internal hemorrhoids.  At that time, my plan was:  At this point I am not sure if her gross hematuria is due to rectal bleeding contaminating the sample or if she has rectal bleeding and gross hematuria simultaneously.  Patient is most concerned today about rectal bleeding.  Rectal exam is unremarkable.  I will set the patient up to see her gastroenterologist as I believe she would benefit from sigmoidoscopy.  If there is an active source of bleeding inside the rectum, perhaps they can facilitate hemostasis.  Once this source of bleeding has been resolved, repeat urinalysis and if blood persists, the patient will need cystoscopy given the normal CT scan.  06/16/22 Since I last saw the patient, she has seen GI and had sigmoidoscopy.  There was no source of bleeding identified although they did take several biopsies.  She is also seen urology.  Apparently  she had urinary retention.  She has an appointment for urodynamics this Friday to evaluate for causes of urinary retention.  She also has an appointment for cystoscopy coming up in November.  The patient presents today for her Pap smear.  She also reports dysuria.  She has been treated numerous times for urinary tract infection.  Today her urinalysis shows leukocyte esterase and blood.  She had a urine  culture on 921 that grew Enterococcus as well as staph hemolyticus.  At that time, my plan was:  Given the previous urine culture, will treat the patient with Augmentin to cover staph as well as Enterococcus.  I am repeating a urine culture today.  Pap smear was sent to pathology and labeled container.  Await the results of her cystoscopy.  Patient does have a 4.8 cm cystic lesion on her right adnexa and she is having pelvic pressure.  If cystoscopy is normal, she continues to have a lot of pelvic discomfort I will recommend GYN consultation given the cystic lesion on the right adnexa.  Blood pressure today is acceptable.  Await the results of the Pap smear and her cystoscopy..  Therefore I feel that she must have a cystoscopy to rule out bladder cancer.  However a cyst on her right adnexa would not explain gross hematuria  09/01/22 Cystoscopy was unremarkable.  Urology did not see any source for the hematuria.  She then went to see her gynecologist regarding the adnexal cyst.  Gynecology treated her with Macrobid for UTI.  Symptoms did not improve.  Patient went to urgent care on 12/9 and had uti treated with levaquin (urine culture showed multiple species).  She presents today continuing to report dysuria as well as vaginal irritation.  Urinalysis shows 0-5 white blood cells, 40-60 red blood cells, many bacteria, moderate yeast.  Patient has 2 ongoing issues.  She still has blood in her stool occasionally.  She has had a colonoscopy that was unremarkable.  She also continues to have recurrent urinary tract infections despite normal cystoscopy, normal CT. she has not been evaluated for a fistula.  She is here today with her brother requesting something to prevent the urinary tract infections. Past Medical History:  Diagnosis Date   Abdominal pain    Allergy    GERD (gastroesophageal reflux disease)    Glaucoma    Hemorrhoids    Hiatal hernia    HTN (hypertension)    IBS (irritable bowel syndrome)     N&V (nausea and vomiting)    Osteoporosis    Ovarian cyst    Rectal bleeding    Rectal ulcer    Sinus problem    TMJ (dislocation of temporomandibular joint)    Vitamin D deficiency    Past Surgical History:  Procedure Laterality Date   ANAL FISSURE REPAIR     BACK SURGERY     CHOLECYSTECTOMY  1984   COLONOSCOPY W/ POLYPECTOMY     colon ulcers   EYE SURGERY     FOOT SURGERY     HEMI-MICRODISCECTOMY LUMBAR LAMINECTOMY LEVEL 1 Left 05/26/2013   Procedure: HEMI-MICRODISCECTOMY LUMBAR LAMINECTOMY L4 - L5 ON THE LEFT LEVEL 1;  Surgeon: Tobi Bastos, MD;  Location: [facility];  Service: Orthopedics;  Laterality: Left;   HEMORROIDECTOMY     Current Outpatient Medications on File Prior to Visit  Medication Sig Dispense Refill   alendronate (FOSAMAX) 70 MG tablet TAKE 1 TABLET BY MOUTH EVERY 7 DAYS. TAKE WITH A FULL GLASS OF WATER ON  AN EMPTY STOMACH. 4 tablet 11   cromolyn (OPTICROM) 4 % ophthalmic solution INSTILL 1 DROP INTO BOTH EYES 4 TIMES A DAY. Strength: 4 % 10 mL 2   dexlansoprazole (DEXILANT) 60 MG capsule Take 60 mg by mouth daily.     Ergocalciferol (VITAMIN D2) 2000 units TABS Take 1 tablet by mouth daily.     fexofenadine (ALLEGRA) 180 MG tablet Take 1 tablet (180 mg total) by mouth daily. 90 tablet 3   fluconazole (DIFLUCAN) 150 MG tablet 1 TAB AT START OF SYMPTOMS, MAY REPEAT 3 DAYS LATER 2 tablet 0   fluticasone (FLONASE) 50 MCG/ACT nasal spray SPRAY 2 SPRAYS INTO EACH NOSTRIL EVERY DAY 48 mL 3   gabapentin (NEURONTIN) 300 MG capsule TAKE 1 CAPSULE BY MOUTH THREE TIMES A DAY 90 capsule 3   hydrochlorothiazide (HYDRODIURIL) 25 MG tablet Take 1 tablet (25 mg total) by mouth daily. 90 tablet 1   ibuprofen (ADVIL) 800 MG tablet Take 1 tablet (800 mg total) by mouth 3 (three) times daily. 15 tablet 0   levofloxacin (LEVAQUIN) 500 MG tablet Take 1 tablet (500 mg total) by mouth daily. 7 tablet 0   losartan (COZAAR) 100 MG tablet Take 1 tablet (100 mg total) by mouth daily. 90  tablet 0   methocarbamol (ROBAXIN) 500 MG tablet Take 1 tablet (500 mg total) by mouth 2 (two) times daily. 10 tablet 0   ondansetron (ZOFRAN) 4 MG tablet Take 1 tablet (4 mg total) by mouth every 6 (six) hours. 12 tablet 0   PATADAY 0.7 % SOLN INSTILL 1 DROP INTO BOTH EYES EVERY DAY 2.5 mL 3   potassium chloride SA (KLOR-CON M) 20 MEQ tablet Take 1 tablet (20 mEq total) by mouth daily. 3 tablet 0   Probiotic, Lactobacillus, CAPS Take 1 capsule by mouth daily. 30 capsule 0   promethazine (PHENERGAN) 25 MG tablet Take 1 tablet (25 mg total) by mouth every 6 (six) hours as needed for nausea or vomiting. 10 tablet 0   No current facility-administered medications on file prior to visit.   Allergies  Allergen Reactions   Hydroxyzine     Other reaction(s): Unknown   Lotronex [Alosetron Hcl]     Other reaction(s): Unknown   Other Other (See Comments)   Hydroxyzine Pamoate Rash   Social History   Socioeconomic History   Marital status: Single    Spouse name: Not on file   Number of children: Not on file   Years of education: Not on file   Highest education level: Not on file  Occupational History   Not on file  Tobacco Use   Smoking status: Never   Smokeless tobacco: Never  Vaping Use   Vaping Use: Never used  Substance and Sexual Activity   Alcohol use: No   Drug use: No   Sexual activity: Not Currently    Birth control/protection: Post-menopausal  Other Topics Concern   Not on file  Social History Narrative   Not on file   Social Determinants of Health   Financial Resource Strain: Low Risk  (08/14/2022)   Overall Financial Resource Strain (CARDIA)    Difficulty of Paying Living Expenses: Not very hard  Food Insecurity: No Food Insecurity (08/14/2022)   Hunger Vital Sign    Worried About Running Out of Food in the Last Year: Never true    Ran Out of Food in the Last Year: Never true  Transportation Needs: No Transportation Needs (08/14/2022)   [place] - Transportation  Lack of Transportation (Medical): No    Lack of Transportation (Non-Medical): No  Physical Activity: Inactive (08/14/2022)   Exercise Vital Sign    Days of Exercise per Week: 0 days    Minutes of Exercise per Session: 0 min  Stress: No Stress Concern Present (08/14/2022)   [place]    Feeling of Stress : Not at all  Social Connections: Socially Isolated (08/14/2022)   Social Connection and Isolation Panel [NHANES]    Frequency of Communication with Friends and Family: More than three times a week    Frequency of Social Gatherings with Friends and Family: Twice a week    Attends Religious Services: Never    Marine scientist or Organizations: No    Attends Archivist Meetings: Never    Marital Status: Never married  Intimate Partner Violence: Not At Risk (08/14/2022)   Humiliation, Afraid, Rape, and Kick questionnaire    Fear of Current or Ex-Partner: No    Emotionally Abused: No    Physically Abused: No    Sexually Abused: No     Review of Systems  All other systems reviewed and are negative.      Objective:   Physical Exam Vitals reviewed.  Constitutional:      General: She is not in acute distress.    Appearance: Normal appearance. She is normal weight. She is not ill-appearing, toxic-appearing or diaphoretic.  HENT:     Nose: Nose normal. No congestion or rhinorrhea.     Mouth/Throat:     Mouth: Mucous membranes are moist.     Pharynx: Oropharynx is clear. No oropharyngeal exudate or posterior oropharyngeal erythema.  Eyes:     Conjunctiva/sclera: Conjunctivae normal.     Pupils: Pupils are equal, round, and reactive to light.  Cardiovascular:     Rate and Rhythm: Normal rate and regular rhythm.     Heart sounds: Normal heart sounds. No murmur heard.    No friction rub. No gallop.  Pulmonary:     Effort: Pulmonary effort is normal. No respiratory distress.     Breath sounds:  Normal breath sounds. No stridor. No wheezing, rhonchi or rales.  Abdominal:     General: Bowel sounds are normal. There is no distension.     Palpations: Abdomen is soft.     Tenderness: There is no abdominal tenderness. There is no guarding or rebound.  Genitourinary:    Rectum: No mass, tenderness, anal fissure or external hemorrhoid. Normal anal tone.  Musculoskeletal:     Right lower leg: No edema.     Left lower leg: No edema.  Skin:    Findings: No lesion or rash.  Neurological:     General: No focal deficit present.     Mental Status: She is alert and oriented to person, place, and time. Mental status is at baseline.     Cranial Nerves: No cranial nerve deficit.     Sensory: No sensory deficit.     Motor: No weakness.     Coordination: Coordination normal.  Psychiatric:        Mood and Affect: Mood normal.        Behavior: Behavior normal.        Thought Content: Thought content normal.        Judgment: Judgment normal.           Assessment & Plan:  Gross hematuria - Plan: Urinalysis, Routine w reflex microscopic, Urine Culture, CT  Abdomen Pelvis Wo Contrast  Recurrent UTI - Plan: CT Abdomen Pelvis Wo Contrast  Acute cystitis with hematuria - Plan: CT Abdomen Pelvis Wo Contrast Patient continues to have recurrent urinary tract infections despite normal workup.  She also continues to have persistent hematuria.  I will repeat a urine culture today to help drop therapy.  We discussed options and the patient elects to start daily prophylactic antibiotic.  Begin Bactrim double strength tablet 1 daily.  We will do this indefinitely.  Obtain CT scan of the abdomen pelvis without contrast but with oral contrast to evaluate for the possibility of a colovesical fistula.  Follow-up with GI regarding persistent blood in her stool but I would assume this is likely due to hemorrhoids.

## 2022-09-02 LAB — URINE CULTURE
MICRO NUMBER:: 14328594
Result:: NO GROWTH
SPECIMEN QUALITY:: ADEQUATE

## 2022-09-04 ENCOUNTER — Other Ambulatory Visit: Payer: Self-pay

## 2022-09-04 MED ORDER — LOSARTAN POTASSIUM 100 MG PO TABS: 100.0000 mg | ORAL_TABLET | Freq: Every day | ORAL | 0 refills | Status: DC

## 2022-09-05 ENCOUNTER — Other Ambulatory Visit: Payer: Self-pay

## 2022-09-05 DIAGNOSIS — I10 Essential (primary) hypertension: Secondary | ICD-10-CM

## 2022-09-05 MED ORDER — LOSARTAN POTASSIUM 100 MG PO TABS: 100.0000 mg | ORAL_TABLET | Freq: Every day | ORAL | 3 refills | Status: DC

## 2022-09-13 ENCOUNTER — Other Ambulatory Visit: Payer: Self-pay | Admitting: Family Medicine

## 2022-09-13 NOTE — Telephone Encounter (Signed)
Requested Prescriptions  Pending Prescriptions Disp Refills   cromolyn (OPTICROM) 4 % ophthalmic solution [Pharmacy Med Name: CROMOLYN 4% EYE DROPS] 10 mL 2    Sig: INSTILL 1 DROP INTO BOTH EYES 4 TIMES A DAY. STRENGTH: 4 %     Ophthalmology:  Antiallergy Passed - 09/13/2022  5:04 AM      Passed - Valid encounter within last 12 months    Recent Outpatient Visits           9 months ago Osteoporosis, unspecified osteoporosis type, unspecified pathological fracture presence   [place] Pickard, Cammie Mcgee, MD   1 year ago Gross hematuria   [place] Dennard Schaumann, Cammie Mcgee, MD   1 year ago General medical exam   [place] Susy Frizzle, MD   2 years ago General medical exam   [place] Susy Frizzle, MD   3 years ago General medical exam   [place] Susy Frizzle, MD       Future Appointments             In 9 months Pickard, Cammie Mcgee, MD [place]

## 2022-10-09 ENCOUNTER — Telehealth: Payer: Self-pay

## 2022-10-09 ENCOUNTER — Ambulatory Visit
Admission: RE | Admit: 2022-10-09 | Discharge: 2022-10-09 | Disposition: A | Discharge status: Home / Self Care | Payer: Medicaid Other | Source: Ambulatory Visit | Attending: Family Medicine | Admitting: Family Medicine

## 2022-10-09 DIAGNOSIS — N390 Urinary tract infection, site not specified: Secondary | ICD-10-CM

## 2022-10-09 DIAGNOSIS — R310 Gross hematuria: Secondary | ICD-10-CM

## 2022-10-09 DIAGNOSIS — N3001 Acute cystitis with hematuria: Secondary | ICD-10-CM

## 2022-10-09 NOTE — Telephone Encounter (Signed)
Pt's brother, Ulice Dash, called very upset stating," The scan Jaena had today was no different from any of the others. I thought they were going to do a more in depth scan to check Phynix for a tunnel between her bowel and bladder." In note from 09/01/2022, it says CT of abdomen w/o contrast but with oral contrast. Ulice Dash states Primrose did not drink any contrast at all.

## 2022-10-22 ENCOUNTER — Other Ambulatory Visit: Payer: Self-pay | Admitting: Family Medicine

## 2022-10-22 NOTE — Telephone Encounter (Signed)
Pt reported 09/01/22  fluconazole (DIFLUCAN) 150 MG tablet [[id number]]   Order Details  Dose, Route, Frequency: As Directed  Dispense Quantity: 2 tablet Refills: 0        Sig: 1 TAB AT START OF SYMPTOMS, MAY REPEAT 3 DAYS LATER  Patient not taking: Reported on 09/01/2022       Refill was refused.

## 2022-10-23 ENCOUNTER — Telehealth: Payer: Self-pay

## 2022-10-23 ENCOUNTER — Other Ambulatory Visit: Payer: Self-pay | Admitting: Family Medicine

## 2022-10-23 MED ORDER — PROBIOTIC (LACTOBACILLUS) PO CAPS: 1.0000 | ORAL_CAPSULE | Freq: Every day | ORAL | 5 refills | Status: AC

## 2022-10-23 MED ORDER — FLUCONAZOLE 150 MG PO TABS: ORAL_TABLET | ORAL | 0 refills | Status: DC

## 2022-10-23 NOTE — Telephone Encounter (Signed)
Pt called and asks if she can have Diflucan sent in to treat a yeast infection? Pt c/o vaginal itching and irritation. Pt also asks if you would recommend for her to take a probiotic. Pt states she has taken acidophilus in the past. Thank you.

## 2022-10-23 NOTE — Telephone Encounter (Signed)
Pt's brother, Ulice Dash, asks if there is any way a prescription probiotic can be sent in, because it would be cheaper for her? Ulice Dash also asks if there is any way possible an MRI could be ordered to make sure there is no fistula between Sean's bowel and bladder? Ulice Dash states, "We just want answers."  Thank you.

## 2022-10-23 NOTE — Telephone Encounter (Signed)
FYI pt called just wanted to let you know, pt  has an apt w/Dr. Elbert Ewings, Monday morning.  Re: pt's bowl situations.

## 2022-11-19 ENCOUNTER — Telehealth: Payer: Self-pay

## 2022-11-19 NOTE — Telephone Encounter (Signed)
Pt's brother came in to drop off ppw to be completed by pcp for pt. Pt's brother would like forms to be faxed after completion and would also like a copy when available. Pt's brother completed an intake form. Intake form and ppw placed in nurse's folder. Please advise.  Cb#: [id number]

## 2022-11-26 NOTE — Telephone Encounter (Signed)
Patient's brother Ulice Dash is requesting for the completed forms to be faxed to [phone], ATTN: Ronney Lion. Paperwork needed asap.   Also requesting a call after fax sent so he can pick up a copy. Stated he works around Biomedical scientist and may not hear his phone ring. If he doesn't answer, please leave a message and he will call back.   Please advise at [phone].

## 2022-11-27 ENCOUNTER — Other Ambulatory Visit: Payer: Self-pay | Admitting: Family Medicine

## 2022-11-27 MED ORDER — GABAPENTIN 300 MG PO CAPS: ORAL_CAPSULE | ORAL | 3 refills | Status: DC

## 2022-11-27 NOTE — Telephone Encounter (Signed)
Prescription Request  11/27/2022  LOV: 09/01/2022  What is the name of the medication or equipment? Gabapentin 300 mg capsule 90 day  Have you contacted your pharmacy to request a refill? Yes   Which pharmacy would you like this sent to?  [id number] [place]: [phone]: [phone]   Patient notified that their request is being sent to the clinical staff for review and that they should receive a response within 2 business days.   Please [facility] at [phone]

## 2022-11-27 NOTE — Telephone Encounter (Signed)
Requested Prescriptions  Pending Prescriptions Disp Refills   gabapentin (NEURONTIN) 300 MG capsule 90 capsule 3    Sig: TAKE 1 CAPSULE BY MOUTH THREE TIMES A DAY     Neurology: Anticonvulsants - gabapentin Failed - 11/27/2022 10:21 AM      Failed - Valid encounter within last 12 months    Recent Outpatient Visits           12 months ago Osteoporosis, unspecified osteoporosis type, unspecified pathological fracture presence   [place] Pickard, Cammie Mcgee, MD   1 year ago Gross hematuria   [place] Pickard, Cammie Mcgee, MD   1 year ago General medical exam   [place] Susy Frizzle, MD   2 years ago General medical exam   [place] Susy Frizzle, MD   3 years ago General medical exam   [place] Susy Frizzle, MD       Future Appointments             In 6 months Pickard, Cammie Mcgee, MD [facility]            Passed - Cr in normal range and within 360 days    Creat  Date Value Ref Range Status  06/03/2021 0.76 0.50 - 1.05 mg/dL Final   Creatinine, Ser  Date Value Ref Range Status  06/05/2022 0.90 0.44 - 1.00 mg/dL Final         Passed - Completed PHQ-2 or PHQ-9 in the last 360 days

## 2023-02-11 ENCOUNTER — Ambulatory Visit (HOSPITAL_COMMUNITY)
Admission: EM | Admit: 2023-02-11 | Discharge: 2023-02-11 | Disposition: A | Discharge status: Home / Self Care | Payer: Medicaid Other | Attending: Sports Medicine | Admitting: Sports Medicine

## 2023-02-11 ENCOUNTER — Encounter (HOSPITAL_COMMUNITY): Payer: Self-pay

## 2023-02-11 DIAGNOSIS — A46 Erysipelas: Secondary | ICD-10-CM | POA: Diagnosis not present

## 2023-02-11 MED ORDER — AMOXICILLIN 875 MG PO TABS: 875.0000 mg | ORAL_TABLET | Freq: Two times a day (BID) | ORAL | 0 refills | Status: DC

## 2023-02-11 MED ORDER — CEPHALEXIN 500 MG PO CAPS: 500.0000 mg | ORAL_CAPSULE | Freq: Four times a day (QID) | ORAL | 0 refills | Status: DC

## 2023-02-11 NOTE — Discharge Instructions (Addendum)
Take Keflex every 6 hours x5 days Monitor rash to see if it spreads outside the lines we drew today See your primary care doctor in 1 week or sooner if not improving  It was nice to meet you today!

## 2023-02-11 NOTE — ED Provider Notes (Signed)
[facility] : [facility]   [id number] 02/11/23 Arrival Time: 0955  ASSESSMENT & PLAN:  1. Erysipelas of both lower extremities    -Clinical presentation is consistent with erysipelas of the bilateral lower extremities.  The rash was outlined and marked on both of her legs today.  Will treat empirically with Keflex 500 mg every 6 hours x 5 days.  Instructed to come back for reevaluation if the rash is not responding to the antibiotics and growing outside of the lines that we drew today.  Will have her follow-up with her primary care doctor in 1 week.  All questions answered agrees to plan.  Meds ordered this encounter  Medications   DISCONTD: amoxicillin (AMOXIL) 875 MG tablet    Sig: Take 1 tablet (875 mg total) by mouth 2 (two) times daily for 7 days.    Dispense:  14 tablet    Refill:  0   cephALEXin (KEFLEX) 500 MG capsule    Sig: Take 1 capsule (500 mg total) by mouth 4 (four) times daily.    Dispense:  20 capsule    Refill:  0     Discharge Instructions      Take amoxicillin       Follow-up Information     Donita Brooks, MD. Go in 1 week.   Specialty: Family Medicine Why: If symptoms worsen Contact information: [address] [place] [address] [phone]                  Reviewed expectations re: course of current medical issues. Questions answered. Outlined signs and symptoms indicating need for more acute intervention. Patient verbalized understanding. After Visit Summary given.   SUBJECTIVE: Pleasant 62 year old female comes urgent care to be evaluated for rash on bilateral lower extremities.  Started about 3 days ago.  It is red, warm, and painful.  She describes the pain as burning in nature.  No inciting injury or trauma.  No known exposures to chemical irritants or poison ivy.  She has not tried putting any medicine on it.  She has not taken any antibiotics.  She has not traveled.  No systemic symptoms such as fever or chest pain  or shortness of breath.  No LMP recorded. Patient is postmenopausal. Past Surgical History:  Procedure Laterality Date   ANAL FISSURE REPAIR     BACK SURGERY     CHOLECYSTECTOMY  1984   COLONOSCOPY W/ POLYPECTOMY     colon ulcers   EYE SURGERY     FOOT SURGERY     HEMI-MICRODISCECTOMY LUMBAR LAMINECTOMY LEVEL 1 Left 05/26/2013   Procedure: HEMI-MICRODISCECTOMY LUMBAR LAMINECTOMY L4 - L5 ON THE LEFT LEVEL 1;  Surgeon: Jacki Cones, MD;  Location: [facility];  Service: Orthopedics;  Laterality: Left;   HEMORROIDECTOMY       OBJECTIVE:  Vitals:   02/11/23 1027  BP: 123/67  Pulse: 75  Resp: 18  Temp: 98.6 F (37 C)  TempSrc: Oral  SpO2: 98%     Physical Exam Vitals and nursing note reviewed.  Constitutional:      General: She is not in acute distress.    Appearance: Normal appearance.  Cardiovascular:     Rate and Rhythm: Normal rate.  Pulmonary:     Effort: Pulmonary effort is normal.  Musculoskeletal:        General: Normal range of motion.  Skin:    Findings: Rash (there is a nonblanching well defined erythematous patch on the medial left lower  extremity. It is warm and painful to the touch. It does not have streaks. There is also a smaller erythematous and warm patch on the right medial lower extremity) present.      Labs:  Labs Reviewed - No data to display  Imaging: No results found.   Allergies  Allergen Reactions   Hydroxyzine     Other reaction(s): Unknown   Lotronex [Alosetron Hcl]     Other reaction(s): Unknown   Other Other (See Comments)   Hydroxyzine Pamoate Rash                                               Past Medical History:  Diagnosis Date   Abdominal pain    Allergy    GERD (gastroesophageal reflux disease)    Glaucoma    Hemorrhoids    Hiatal hernia    HTN (hypertension)    IBS (irritable bowel syndrome)    N&V (nausea and vomiting)    Osteoporosis    Ovarian cyst    Rectal bleeding    Rectal ulcer    Sinus problem     TMJ (dislocation of temporomandibular joint)    Vitamin D deficiency     Social History   Socioeconomic History   Marital status: Single    Spouse name: Not on file   Number of children: Not on file   Years of education: Not on file   Highest education level: Not on file  Occupational History   Not on file  Tobacco Use   Smoking status: Never   Smokeless tobacco: Never  Vaping Use   Vaping Use: Never used  Substance and Sexual Activity   Alcohol use: No   Drug use: No   Sexual activity: Not Currently    Birth control/protection: Post-menopausal  Other Topics Concern   Not on file  Social History Narrative   Not on file   Social Determinants of Health   Financial Resource Strain: Low Risk  (08/14/2022)   Overall Financial Resource Strain (CARDIA)    Difficulty of Paying Living Expenses: Not very hard  Food Insecurity: No Food Insecurity (08/14/2022)   Hunger Vital Sign    Worried About Running Out of Food in the Last Year: Never true    Ran Out of Food in the Last Year: Never true  Transportation Needs: No Transportation Needs (08/14/2022)   PRAPARE - Administrator, Civil Service (Medical): No    Lack of Transportation (Non-Medical): No  Physical Activity: Inactive (08/14/2022)   Exercise Vital Sign    Days of Exercise per Week: 0 days    Minutes of Exercise per Session: 0 min  Stress: No Stress Concern Present (08/14/2022)   [employer] of Occupational Health - Occupational Stress Questionnaire    Feeling of Stress : Not at all  Social Connections: Socially Isolated (08/14/2022)   Social Connection and Isolation Panel [NHANES]    Frequency of Communication with Friends and Family: More than three times a week    Frequency of Social Gatherings with Friends and Family: Twice a week    Attends Religious Services: Never    Database administrator or Organizations: No    Attends Banker Meetings: Never    Marital Status: Never married   Intimate Partner Violence: Not At Risk (08/14/2022)   Humiliation, Afraid, Rape, and Kick  questionnaire    Fear of Current or Ex-Partner: No    Emotionally Abused: No    Physically Abused: No    Sexually Abused: No    Family History  Problem Relation Age of Onset   Diabetes Mother    Cancer Mother    Stroke Father    Breast cancer Neg Hx       Heather Gillespie, Baldemar Friday, MD 02/11/23 1130

## 2023-02-11 NOTE — ED Triage Notes (Signed)
Onset; 3 days ago Pt started with burning,itchy rash on her legs.

## 2023-02-12 ENCOUNTER — Other Ambulatory Visit: Payer: Self-pay | Admitting: Family Medicine

## 2023-02-13 ENCOUNTER — Other Ambulatory Visit: Payer: Self-pay | Admitting: Family Medicine

## 2023-02-13 ENCOUNTER — Other Ambulatory Visit: Payer: Self-pay | Admitting: Sports Medicine

## 2023-02-13 MED ORDER — HYDROCHLOROTHIAZIDE 25 MG PO TABS: 25.0000 mg | ORAL_TABLET | Freq: Every day | ORAL | 1 refills | Status: DC

## 2023-02-13 NOTE — Telephone Encounter (Signed)
Requested medication (s) are due for refill today: Yes  Requested medication (s) are on the active medication list: Yes  Last refill:  10/23/22  Future visit scheduled: Yes  Notes to clinic:  Not assigned a protocol.    Requested Prescriptions  Pending Prescriptions Disp Refills   fluconazole (DIFLUCAN) 150 MG tablet [Pharmacy Med Name: FLUCONAZOLE 150 MG TABLET] 2 tablet 0    Sig: 1 TAB AT START OF SYMPTOMS, MAY REPEAT 3 DAYS LATER     Off-Protocol Failed - 02/12/2023 11:20 AM      Failed - Medication not assigned to a protocol, review manually.      Failed - Valid encounter within last 12 months    Recent Outpatient Visits           1 year ago Osteoporosis, unspecified osteoporosis type, unspecified pathological fracture presence   [facility] Pickard, Priscille Heidelberg, MD   1 year ago Gross hematuria   [facility] Tanya Nones, Priscille Heidelberg, MD   1 year ago General medical exam   [facility] Donita Brooks, MD   2 years ago General medical exam   [facility] Donita Brooks, MD   3 years ago General medical exam   [facility] Medicine Donita Brooks, MD       Future Appointments             In 4 months Pickard, Priscille Heidelberg, MD [facility], [facility]

## 2023-02-13 NOTE — Telephone Encounter (Signed)
Requested Prescriptions  Pending Prescriptions Disp Refills   hydrochlorothiazide (HYDRODIURIL) 25 MG tablet 90 tablet 1    Sig: Take 1 tablet (25 mg total) by mouth daily.     Cardiovascular: Diuretics - Thiazide Failed - 02/13/2023 11:45 AM      Failed - Cr in normal range and within 180 days    Creat  Date Value Ref Range Status  06/03/2021 0.76 0.50 - 1.05 mg/dL Final   Creatinine, Ser  Date Value Ref Range Status  06/05/2022 0.90 0.44 - 1.00 mg/dL Final         Failed - K in normal range and within 180 days    Potassium  Date Value Ref Range Status  06/05/2022 3.2 (L) 3.5 - 5.1 mmol/L Final         Failed - Na in normal range and within 180 days    Sodium  Date Value Ref Range Status  06/05/2022 138 135 - 145 mmol/L Final         Failed - Valid encounter within last 6 months    Recent Outpatient Visits           1 year ago Osteoporosis, unspecified osteoporosis type, unspecified pathological fracture presence   [facility] Pickard, Priscille Heidelberg, MD   1 year ago Gross hematuria   [facility] Donita Brooks, MD   1 year ago General medical exam   [facility] Donita Brooks, MD   2 years ago General medical exam   [facility] Medicine Donita Brooks, MD   3 years ago General medical exam   [facility] Donita Brooks, MD       Future Appointments             In 4 months Pickard, Priscille Heidelberg, MD [facility], [facility]            Passed - Last BP in normal range    BP Readings from Last 1 Encounters:  02/11/23 123/67

## 2023-02-13 NOTE — Telephone Encounter (Signed)
Prescription Request  02/13/2023  LOV: 09/01/2022  What is the name of the medication or equipment?   hydrochlorothiazide (HYDRODIURIL) 25 MG tablet [[id number]]   Have you contacted your pharmacy to request a refill? Yes   Which pharmacy would you like this sent to?  [id number] [place], [place] - [address] [place] [address] Phone: [phone] Fax: [phone]    Patient notified that their request is being sent to the clinical staff for review and that they should receive a response within 2 business days.   Please advise pharmacist at [phone].

## 2023-03-17 ENCOUNTER — Other Ambulatory Visit: Payer: Self-pay | Admitting: Family Medicine

## 2023-03-17 ENCOUNTER — Telehealth: Payer: Self-pay | Admitting: Family Medicine

## 2023-03-17 MED ORDER — GABAPENTIN 300 MG PO CAPS: ORAL_CAPSULE | ORAL | 5 refills | Status: DC

## 2023-03-17 NOTE — Telephone Encounter (Signed)
Prescription Request  03/17/2023  LOV: 09/01/2022  What is the name of the medication or equipment?   gabapentin (NEURONTIN) 300 MG capsule [[id number]]   Have you contacted your pharmacy to request a refill? Yes   Which pharmacy would you like this sent to?  [id number] [place], [place] - [address] [place] [address] Phone: [phone] Fax: [phone]    Patient notified that their request is being sent to the clinical staff for review and that they should receive a response within 2 business days.   Please advise pharmacist.

## 2023-04-02 ENCOUNTER — Other Ambulatory Visit: Payer: Self-pay | Admitting: Family Medicine

## 2023-04-06 ENCOUNTER — Other Ambulatory Visit: Payer: Self-pay

## 2023-04-06 ENCOUNTER — Other Ambulatory Visit: Payer: Self-pay | Admitting: Family Medicine

## 2023-04-06 DIAGNOSIS — I10 Essential (primary) hypertension: Secondary | ICD-10-CM

## 2023-04-06 NOTE — Telephone Encounter (Signed)
Prescription Request  04/06/2023  LOV: 09/01/22  What is the name of the medication or equipment? losartan (COZAAR) 100 MG tablet [[id number]]  Have you contacted your pharmacy to request a refill? Yes   Which pharmacy would you like this sent to?  [id number] [place], [place] - [address] [place] [address] Phone: [phone] Fax: [phone]    Patient notified that their request is being sent to the clinical staff for review and that they should receive a response within 2 business days.   Please advise at [facility] [phone]

## 2023-04-07 MED ORDER — LOSARTAN POTASSIUM 100 MG PO TABS: 100.0000 mg | ORAL_TABLET | Freq: Every day | ORAL | 1 refills | Status: DC

## 2023-04-07 NOTE — Telephone Encounter (Signed)
Requested Prescriptions  Pending Prescriptions Disp Refills   losartan (COZAAR) 100 MG tablet 90 tablet 1    Sig: Take 1 tablet (100 mg total) by mouth daily.     Cardiovascular:  Angiotensin Receptor Blockers Failed - 04/06/2023  9:28 AM      Failed - Cr in normal range and within 180 days    Creat  Date Value Ref Range Status  06/03/2021 0.76 0.50 - 1.05 mg/dL Final   Creatinine, Ser  Date Value Ref Range Status  06/05/2022 0.90 0.44 - 1.00 mg/dL Final         Failed - K in normal range and within 180 days    Potassium  Date Value Ref Range Status  06/05/2022 3.2 (L) 3.5 - 5.1 mmol/L Final         Failed - Valid encounter within last 6 months    Recent Outpatient Visits           1 year ago Osteoporosis, unspecified osteoporosis type, unspecified pathological fracture presence   [facility] Pickard, Priscille Heidelberg, MD   1 year ago Gross hematuria   [facility] Medicine Donita Brooks, MD   1 year ago General medical exam   [facility] Medicine Donita Brooks, MD   2 years ago General medical exam   [facility] Medicine Donita Brooks, MD   3 years ago General medical exam   [facility] Donita Brooks, MD       Future Appointments             In 2 months Pickard, Priscille Heidelberg, MD [facility], [facility]            Passed - Patient is not pregnant      Passed - Last BP in normal range    BP Readings from Last 1 Encounters:  02/11/23 123/67

## 2023-04-08 ENCOUNTER — Encounter: Payer: Self-pay | Admitting: Obstetrics & Gynecology

## 2023-04-08 ENCOUNTER — Ambulatory Visit: Payer: Medicaid Other | Admitting: Obstetrics & Gynecology

## 2023-04-08 VITALS — BP 118/63 | HR 70 | Ht 62.5 in | Wt 121.0 lb

## 2023-04-08 DIAGNOSIS — R102 Pelvic and perineal pain: Secondary | ICD-10-CM | POA: Diagnosis not present

## 2023-04-08 DIAGNOSIS — Z8742 Personal history of other diseases of the female genital tract: Secondary | ICD-10-CM

## 2023-04-08 NOTE — Progress Notes (Signed)
GYN VISIT Patient name: Heather Gillespie MRN [id number]  Date of birth: 27-Jul-1961 Chief Complaint:   Ovarian Cyst  History of Present Illness:   Heather Gillespie is a 62 y.o. G0P0000 PM female being seen today for the following concerns:  -Right-sided pain: She notes bloating and swelling after eating.  Reports mid to right sided abdominal pain.  Pain at times is daily and seems to have gotten worse.  Rates her pain 9/10.  On occasion may take ibuprofen.  Notes some radiation of her pain to her back.  Denies vaginal bleeding, discharge, itching or irritation.  No urinary concerns.  No other acute complaints.  Last imaging completed 09/2022: Uterus is unremarkable. 4.0 x 3.5 cm simple cyst is seen in the right adnexa, which shows slight decrease in size from 4.9 x 4.0 cm on prior exam.  Denies vaginal bleeding, itching or irritation.      No LMP recorded. Patient is postmenopausal.    Review of Systems:   Pertinent items are noted in HPI Denies fever/chills, dizziness, headaches, visual disturbances, fatigue, shortness of breath, chest pain, vomiting. Pertinent History Reviewed:   Past Surgical History:  Procedure Laterality Date   ANAL FISSURE REPAIR     BACK SURGERY     CHOLECYSTECTOMY  1984   COLONOSCOPY W/ POLYPECTOMY     colon ulcers   EYE SURGERY     FOOT SURGERY     HEMI-MICRODISCECTOMY LUMBAR LAMINECTOMY LEVEL 1 Left 05/26/2013   Procedure: HEMI-MICRODISCECTOMY LUMBAR LAMINECTOMY L4 - L5 ON THE LEFT LEVEL 1;  Surgeon: Jacki Cones, MD;  Location: [facility];  Service: Orthopedics;  Laterality: Left;   HEMORROIDECTOMY      Past Medical History:  Diagnosis Date   Abdominal pain    Allergy    GERD (gastroesophageal reflux disease)    Glaucoma    Hemorrhoids    Hiatal hernia    HTN (hypertension)    IBS (irritable bowel syndrome)    N&V (nausea and vomiting)    Osteoporosis    Ovarian cyst    Rectal bleeding    Rectal ulcer    Sinus problem    TMJ  (dislocation of temporomandibular joint)    Vitamin D deficiency    Reviewed problem list, medications and allergies. Physical Assessment:   Vitals:   04/08/23 1340  BP: 118/63  Pulse: 70  Weight: 121 lb (54.9 kg)  Height: 5' 2.5" (1.588 m)  Body mass index is 21.78 kg/m.       Physical Examination:   General appearance: alert, well appearing, and in no distress  Psych: mood appropriate, normal affect  Skin: warm & dry   Cardiovascular: normal heart rate noted  Respiratory: normal respiratory effort, no distress  Abdomen: soft, +mid to right tenderness with palpation, no rebound, no guarding  Pelvic: VULVA: normal appearing vulva with no masses, tenderness or lesions, VAGINA: normal appearing vagina with normal color and discharge, no lesions, ADNEXA: no masses appreciated.  Notes tenderness on right side during exam  Extremities: no edema   Chaperone: Faith Rogue    Assessment & Plan:  1) pelvic pain, h/o right ovarian cyst -plan for pelvic US at next available further management pending results -Based on history concern that symptoms may be GI in nature   Orders Placed This Encounter  Procedures   US PELVIC COMPLETE WITH TRANSVAGINAL    Return for please schedule for pelvic US (ok at [facility]).   Myna Hidalgo, DO Attending Obstetrician &  Gynecologist, Biochemist, clinical for [employer], [facility]

## 2023-04-16 ENCOUNTER — Ambulatory Visit (HOSPITAL_COMMUNITY)
Admission: RE | Admit: 2023-04-16 | Discharge: 2023-04-16 | Disposition: A | Discharge status: Home / Self Care | Payer: Medicaid Other | Source: Ambulatory Visit | Attending: Obstetrics & Gynecology | Admitting: Obstetrics & Gynecology

## 2023-04-16 DIAGNOSIS — R102 Pelvic and perineal pain: Secondary | ICD-10-CM | POA: Insufficient documentation

## 2023-04-16 DIAGNOSIS — Z8742 Personal history of other diseases of the female genital tract: Secondary | ICD-10-CM | POA: Diagnosis present

## 2023-04-28 ENCOUNTER — Ambulatory Visit: Payer: Medicaid Other | Admitting: Obstetrics & Gynecology

## 2023-04-29 ENCOUNTER — Other Ambulatory Visit: Payer: Self-pay | Admitting: Family Medicine

## 2023-04-30 ENCOUNTER — Ambulatory Visit (INDEPENDENT_AMBULATORY_CARE_PROVIDER_SITE_OTHER): Payer: Medicaid Other

## 2023-04-30 ENCOUNTER — Ambulatory Visit: Payer: Medicaid Other | Admitting: Podiatry

## 2023-04-30 ENCOUNTER — Encounter: Payer: Self-pay | Admitting: Podiatry

## 2023-04-30 DIAGNOSIS — M7672 Peroneal tendinitis, left leg: Secondary | ICD-10-CM

## 2023-04-30 MED ORDER — TRIAMCINOLONE ACETONIDE 10 MG/ML IJ SUSP
10.0000 mg | Freq: Once | INTRAMUSCULAR | Status: AC
  Administered 2023-04-30: 10 mg via INTRA_ARTICULAR

## 2023-04-30 NOTE — Telephone Encounter (Signed)
Requested medication (s) are due for refill today - unsure  Requested medication (s) are on the active medication list -yes  Future visit scheduled -yes  Last refill: 02/13/23 #2  Notes to clinic: off protocol- provider review   Requested Prescriptions  Pending Prescriptions Disp Refills   fluconazole (DIFLUCAN) 150 MG tablet [Pharmacy Med Name: FLUCONAZOLE 150 MG TABLET] 2 tablet 0    Sig: TAKE 1 TABLET BY MOUTH AT START OF SYMPTOMS, MAY REPEAT 3 DAYS LATER     Off-Protocol Failed - 04/29/2023  1:54 PM      Failed - Medication not assigned to a protocol, review manually.      Failed - Valid encounter within last 12 months    Recent Outpatient Visits           1 year ago Osteoporosis, unspecified osteoporosis type, unspecified pathological fracture presence   [facility] Pickard, Priscille Heidelberg, MD   1 year ago Gross hematuria   [facility] Tanya Nones, Priscille Heidelberg, MD   1 year ago General medical exam   [facility] Family Medicine Donita Brooks, MD   2 years ago General medical exam   [facility] Donita Brooks, MD   3 years ago General medical exam   [facility] Family Medicine Donita Brooks, MD       Future Appointments             In 1 month Pickard, Priscille Heidelberg, MD [facility] Family Medicine, [facility]               Requested Prescriptions  Pending Prescriptions Disp Refills   fluconazole (DIFLUCAN) 150 MG tablet [Pharmacy Med Name: FLUCONAZOLE 150 MG TABLET] 2 tablet 0    Sig: TAKE 1 TABLET BY MOUTH AT START OF SYMPTOMS, MAY REPEAT 3 DAYS LATER     Off-Protocol Failed - 04/29/2023  1:54 PM      Failed - Medication not assigned to a protocol, review manually.      Failed - Valid encounter within last 12 months    Recent Outpatient Visits           1 year ago Osteoporosis, unspecified osteoporosis type, unspecified pathological fracture presence   [facility] Pickard, Priscille Heidelberg, MD    1 year ago Gross hematuria   [facility] Tanya Nones, Priscille Heidelberg, MD   1 year ago General medical exam   [facility] Medicine Donita Brooks, MD   2 years ago General medical exam   [facility] Medicine Donita Brooks, MD   3 years ago General medical exam   [facility] Donita Brooks, MD       Future Appointments             In 1 month Pickard, Priscille Heidelberg, MD [facility], [facility]

## 2023-04-30 NOTE — Progress Notes (Signed)
Subjective:   Patient ID: Heather Gillespie, female   DOB: 62 y.o.   MRN: [id number]   HPI Patient states very recently she started to get pain again she did have relief for around a year   ROS      Objective:  Physical Exam  Neurovascular status intact inflammation around the peroneal base fifth metatarsal left slight fluid buildup noted good digital perfusion well-oriented     Assessment:  Peroneal tendinitis left with inflammation     Plan:  Reviewed condition went ahead today did sterile prep injected the insertion after explaining risk 3 mg Dexasone Kenalog 5 mg Xylocaine reappoint to recheck  Precautionary x-ray taken today was negative for indications of fracture or arthritis from this procedure did have 1/5 metatarsal floating osteotomy which is done well several years ago

## 2023-05-04 ENCOUNTER — Other Ambulatory Visit: Payer: Self-pay | Admitting: Family Medicine

## 2023-05-04 DIAGNOSIS — M810 Age-related osteoporosis without current pathological fracture: Secondary | ICD-10-CM

## 2023-05-04 NOTE — Telephone Encounter (Signed)
Prescription Request  05/04/2023  LOV: 09/01/2022  What is the name of the medication or equipment?   alendronate (FOSAMAX) 70 MG tablet   Have you contacted your pharmacy to request a refill? Yes   Which pharmacy would you like this sent to?  [id number] [place], [place] - [address] [place] [address] Phone: [phone] Fax: [phone]    Patient notified that their request is being sent to the clinical staff for review and that they should receive a response within 2 business days.   Please advise pharmacist.

## 2023-05-06 NOTE — Telephone Encounter (Signed)
Requested medication (s) are due for refill today: Yes  Requested medication (s) are on the active medication list: Yes  Last refill:  05/27/23, #4, 11RF  Future visit scheduled: Yes  Notes to clinic:  Unable to refill per protocol due to failed labs, no updated results.      Requested Prescriptions  Pending Prescriptions Disp Refills   alendronate (FOSAMAX) 70 MG tablet 4 tablet 11    Sig: TAKE 1 TABLET BY MOUTH EVERY 7 DAYS. TAKE WITH A FULL GLASS OF WATER ON AN EMPTY STOMACH.     Endocrinology:  Bisphosphonates Failed - 05/04/2023  1:41 PM      Failed - Vitamin D in normal range and within 360 days    Vit D, 1,25-Dihydroxy  Date Value Ref Range Status  06/30/2008 9 (L) 30 - 89 Final    Comment:    See lab report for associated comment(s)   Vit D, 25-Hydroxy  Date Value Ref Range Status  11/28/2021 38 30 - 100 ng/mL Final    Comment:    Vitamin D Status         25-OH Vitamin D: . Deficiency:                    <20 ng/mL Insufficiency:             20 - 29 ng/mL Optimal:                 > or = 30 ng/mL . For 25-OH Vitamin D testing on patients on  D2-supplementation and patients for whom quantitation  of D2 and D3 fractions is required, the QuestAssureD(TM) 25-OH VIT D, (D2,D3), LC/MS/MS is recommended: order  code 62130 (patients >44yrs). See Note 1 . Note 1 . For additional information, please refer to  http://education.Quest[facility].com/faq/FAQ199  (This link is being provided for informational/ educational purposes only.)          Failed - Mg Level in normal range and within 360 days    No results found for: "MG"       Failed - Phosphate in normal range and within 360 days    No results found for: "PHOS"       Failed - Valid encounter within last 12 months    Recent Outpatient Visits           1 year ago Osteoporosis, unspecified osteoporosis type, unspecified pathological fracture presence   [facility] Pickard, Priscille Heidelberg, MD   1  year ago Gross hematuria   [facility] Tanya Nones, Priscille Heidelberg, MD   1 year ago General medical exam   [facility] Donita Brooks, MD   2 years ago General medical exam   [facility] Donita Brooks, MD   3 years ago General medical exam   [facility] Donita Brooks, MD       Future Appointments             In 1 month Pickard, Priscille Heidelberg, MD [facility]            Passed - Ca in normal range and within 360 days    Calcium  Date Value Ref Range Status  06/05/2022 9.5 8.9 - 10.3 mg/dL Final         Passed - Cr in normal range and within 360 days    Creat  Date Value Ref Range Status  06/03/2021 0.76 0.50 - 1.05 mg/dL Final   Creatinine, Ser  Date Value Ref Range Status  06/05/2022 0.90 0.44 - 1.00 mg/dL Final         Passed - eGFR is 30 or above and within 360 days    GFR, Est African American  Date Value Ref Range Status  05/31/2020 94 > OR = 60 mL/min/1.87m2 Final   GFR, Est Non African American  Date Value Ref Range Status  05/31/2020 81 > OR = 60 mL/min/1.17m2 Final   GFR, Estimated  Date Value Ref Range Status  06/05/2022 >60 >60 mL/min Final    Comment:    (NOTE) Calculated using the CKD-EPI Creatinine Equation (2021)    eGFR  Date Value Ref Range Status  06/03/2021 90 > OR = 60 mL/min/1.21m2 Final    Comment:    The eGFR is based on the CKD-EPI 2021 equation. To calculate  the new eGFR from a previous Creatinine or Cystatin C result, go to https://www.kidney.org/professionals/ kdoqi/gfr%5Fcalculator          Passed - Bone Mineral Density or Dexa Scan completed in the last 2 years

## 2023-05-07 NOTE — Telephone Encounter (Signed)
Requested medication (s) are due for refill today: Due 05/27/23  Requested medication (s) are on the active medication list: yes    Last refill: 05/26/22  #4 11 refills  Future visit scheduled yes 06/22/23  Notes to clinic:Failed due to labs, please review. Thank you.  Requested Prescriptions  Pending Prescriptions Disp Refills   alendronate (FOSAMAX) 70 MG tablet 4 tablet 11    Sig: TAKE 1 TABLET BY MOUTH EVERY 7 DAYS. TAKE WITH A FULL GLASS OF WATER ON AN EMPTY STOMACH.     Endocrinology:  Bisphosphonates Failed - 05/07/2023  8:25 AM      Failed - Vitamin D in normal range and within 360 days    Vit D, 1,25-Dihydroxy  Date Value Ref Range Status  06/30/2008 9 (L) 30 - 89 Final    Comment:    See lab report for associated comment(s)   Vit D, 25-Hydroxy  Date Value Ref Range Status  11/28/2021 38 30 - 100 ng/mL Final    Comment:    Vitamin D Status         25-OH Vitamin D: . Deficiency:                    <20 ng/mL Insufficiency:             20 - 29 ng/mL Optimal:                 > or = 30 ng/mL . For 25-OH Vitamin D testing on patients on  D2-supplementation and patients for whom quantitation  of D2 and D3 fractions is required, the QuestAssureD(TM) 25-OH VIT D, (D2,D3), LC/MS/MS is recommended: order  code 40981 (patients >52yrs). See Note 1 . Note 1 . For additional information, please refer to  http://education.Quest[facility].com/faq/FAQ199  (This link is being provided for informational/ educational purposes only.)          Failed - Mg Level in normal range and within 360 days    No results found for: "MG"       Failed - Phosphate in normal range and within 360 days    No results found for: "PHOS"       Failed - Valid encounter within last 12 months    Recent Outpatient Visits           1 year ago Osteoporosis, unspecified osteoporosis type, unspecified pathological fracture presence   [facility] Pickard, Priscille Heidelberg, MD   1 year ago  Gross hematuria   [facility] Tanya Nones, Priscille Heidelberg, MD   1 year ago General medical exam   [facility] Donita Brooks, MD   2 years ago General medical exam   [facility] Donita Brooks, MD   3 years ago General medical exam   [facility] Donita Brooks, MD       Future Appointments             In 1 month Pickard, Priscille Heidelberg, MD [facility]            Passed - Ca in normal range and within 360 days    Calcium  Date Value Ref Range Status  06/05/2022 9.5 8.9 - 10.3 mg/dL Final         Passed - Cr in normal range and within 360 days    Creat  Date Value Ref Range Status  06/03/2021 0.76 0.50 - 1.05  mg/dL Final   Creatinine, Ser  Date Value Ref Range Status  06/05/2022 0.90 0.44 - 1.00 mg/dL Final         Passed - eGFR is 30 or above and within 360 days    GFR, Est African American  Date Value Ref Range Status  05/31/2020 94 > OR = 60 mL/min/1.37m2 Final   GFR, Est Non African American  Date Value Ref Range Status  05/31/2020 81 > OR = 60 mL/min/1.28m2 Final   GFR, Estimated  Date Value Ref Range Status  06/05/2022 >60 >60 mL/min Final    Comment:    (NOTE) Calculated using the CKD-EPI Creatinine Equation (2021)    eGFR  Date Value Ref Range Status  06/03/2021 90 > OR = 60 mL/min/1.43m2 Final    Comment:    The eGFR is based on the CKD-EPI 2021 equation. To calculate  the new eGFR from a previous Creatinine or Cystatin C result, go to https://www.kidney.org/professionals/ kdoqi/gfr%5Fcalculator          Passed - Bone Mineral Density or Dexa Scan completed in the last 2 years

## 2023-05-07 NOTE — Telephone Encounter (Signed)
Pharmacy requesting refill of alendronate (FOSAMAX) 70 MG tablet   LOV 09/01/2022  Pharmacy:  [id number] [place], [place] - [address] [place], [place] [address] Phone: [phone]  Fax: [phone] DEA #: [id number]   Please advise pharmacist.

## 2023-05-11 ENCOUNTER — Other Ambulatory Visit: Payer: Self-pay | Admitting: Family Medicine

## 2023-05-11 DIAGNOSIS — M810 Age-related osteoporosis without current pathological fracture: Secondary | ICD-10-CM

## 2023-05-11 NOTE — Telephone Encounter (Signed)
Prescription Request  05/11/2023  LOV: 09/01/2022  What is the name of the medication or equipment? alendronate (FOSAMAX) 70 MG tablet [[id number]]   Order Details Dose, Route, Frequency: As Directed  Dispense Quantity: 4 tablet Refills: 11        Sig: TAKE 1 TABLET BY MOUTH EVERY 7 DAYS. TAKE WITH A FULL GLASS OF WATER ON AN EMPTY STOMACH.    Have you contacted your pharmacy to request a refill? Yes   Which pharmacy would you like this sent to?  [id number] [place], [place] - [address] [place] [address] Phone: [phone] Fax: [phone]    Patient notified that their request is being sent to the clinical staff for review and that they should receive a response within 2 business days.   Please advise at [facility] [phone]

## 2023-05-12 NOTE — Telephone Encounter (Signed)
Requested medication (s) are due for refill today: yes  Requested medication (s) are on the active medication list: yes  Last refill:  05/26/22 #4 tab 11 refills  Future visit scheduled: yes in 1 month  Notes to clinic:  protocol failed. Last labs 11/28/21. Do you want to refill Rx?     Requested Prescriptions  Pending Prescriptions Disp Refills   alendronate (FOSAMAX) 70 MG tablet 4 tablet 11    Sig: TAKE 1 TABLET BY MOUTH EVERY 7 DAYS. TAKE WITH A FULL GLASS OF WATER ON AN EMPTY STOMACH.     Endocrinology:  Bisphosphonates Failed - 05/11/2023  5:22 PM      Failed - Vitamin D in normal range and within 360 days    Vit D, 1,25-Dihydroxy  Date Value Ref Range Status  06/30/2008 9 (L) 30 - 89 Final    Comment:    See lab report for associated comment(s)   Vit D, 25-Hydroxy  Date Value Ref Range Status  11/28/2021 38 30 - 100 ng/mL Final    Comment:    Vitamin D Status         25-OH Vitamin D: . Deficiency:                    <20 ng/mL Insufficiency:             20 - 29 ng/mL Optimal:                 > or = 30 ng/mL . For 25-OH Vitamin D testing on patients on  D2-supplementation and patients for whom quantitation  of D2 and D3 fractions is required, the QuestAssureD(TM) 25-OH VIT D, (D2,D3), LC/MS/MS is recommended: order  code 16109 (patients >77yrs). See Note 1 . Note 1 . For additional information, please refer to  http://education.Quest[facility].com/faq/FAQ199  (This link is being provided for informational/ educational purposes only.)          Failed - Mg Level in normal range and within 360 days    No results found for: "MG"       Failed - Phosphate in normal range and within 360 days    No results found for: "PHOS"       Failed - Valid encounter within last 12 months    Recent Outpatient Visits           1 year ago Osteoporosis, unspecified osteoporosis type, unspecified pathological fracture presence   [facility] Pickard, Priscille Heidelberg,  MD   1 year ago Gross hematuria   [facility] Tanya Nones, Priscille Heidelberg, MD   1 year ago General medical exam   [facility] Donita Brooks, MD   2 years ago General medical exam   [facility] Donita Brooks, MD   3 years ago General medical exam   [facility] Pickard, Priscille Heidelberg, MD       Future Appointments             In 1 month Pickard, Priscille Heidelberg, MD [facility]            Passed - Ca in normal range and within 360 days    Calcium  Date Value Ref Range Status  06/05/2022 9.5 8.9 - 10.3 mg/dL Final         Passed - Cr in normal range and within 360 days    Creat  Date Value Ref  Range Status  06/03/2021 0.76 0.50 - 1.05 mg/dL Final   Creatinine, Ser  Date Value Ref Range Status  06/05/2022 0.90 0.44 - 1.00 mg/dL Final         Passed - eGFR is 30 or above and within 360 days    GFR, Est African American  Date Value Ref Range Status  05/31/2020 94 > OR = 60 mL/min/1.59m2 Final   GFR, Est Non African American  Date Value Ref Range Status  05/31/2020 81 > OR = 60 mL/min/1.49m2 Final   GFR, Estimated  Date Value Ref Range Status  06/05/2022 >60 >60 mL/min Final    Comment:    (NOTE) Calculated using the CKD-EPI Creatinine Equation (2021)    eGFR  Date Value Ref Range Status  06/03/2021 90 > OR = 60 mL/min/1.49m2 Final    Comment:    The eGFR is based on the CKD-EPI 2021 equation. To calculate  the new eGFR from a previous Creatinine or Cystatin C result, go to https://www.kidney.org/professionals/ kdoqi/gfr%5Fcalculator          Passed - Bone Mineral Density or Dexa Scan completed in the last 2 years

## 2023-05-15 ENCOUNTER — Encounter (HOSPITAL_BASED_OUTPATIENT_CLINIC_OR_DEPARTMENT_OTHER): Payer: Self-pay | Admitting: Emergency Medicine

## 2023-05-15 ENCOUNTER — Emergency Department (HOSPITAL_BASED_OUTPATIENT_CLINIC_OR_DEPARTMENT_OTHER)
Admission: EM | Admit: 2023-05-15 | Discharge: 2023-05-15 | Disposition: A | Discharge status: Home / Self Care | Payer: Medicaid Other | Attending: Emergency Medicine | Admitting: Emergency Medicine

## 2023-05-15 ENCOUNTER — Other Ambulatory Visit: Payer: Self-pay

## 2023-05-15 DIAGNOSIS — R112 Nausea with vomiting, unspecified: Secondary | ICD-10-CM

## 2023-05-15 DIAGNOSIS — K625 Hemorrhage of anus and rectum: Secondary | ICD-10-CM | POA: Insufficient documentation

## 2023-05-15 LAB — COMPREHENSIVE METABOLIC PANEL
ALT: 8 U/L (ref 0–44)
AST: 19 U/L (ref 15–41)
Albumin: 4.2 g/dL (ref 3.5–5.0)
Alkaline Phosphatase: 41 U/L (ref 38–126)
Anion gap: 9 (ref 5–15)
BUN: 15 mg/dL (ref 8–23)
CO2: 25 mmol/L (ref 22–32)
Calcium: 9.4 mg/dL (ref 8.9–10.3)
Chloride: 99 mmol/L (ref 98–111)
Creatinine, Ser: 1.15 mg/dL — ABNORMAL HIGH (ref 0.44–1.00)
GFR, Estimated: 54 mL/min — ABNORMAL LOW (ref >60)
Glucose, Bld: 88 mg/dL (ref 70–99)
Potassium: 3.9 mmol/L (ref 3.5–5.1)
Sodium: 133 mmol/L — ABNORMAL LOW (ref 135–145)
Total Bilirubin: 0.5 mg/dL (ref 0.3–1.2)
Total Protein: 7.3 g/dL (ref 6.5–8.1)

## 2023-05-15 LAB — CBC WITH DIFFERENTIAL/PLATELET
Abs Immature Granulocytes: 0 10*3/uL (ref 0.00–0.07)
Basophils Absolute: 0 10*3/uL (ref 0.0–0.1)
Basophils Relative: 1 %
Eosinophils Absolute: 0 10*3/uL (ref 0.0–0.5)
Eosinophils Relative: 1 %
HCT: 32 % — ABNORMAL LOW (ref 36.0–46.0)
Hemoglobin: 10.9 g/dL — ABNORMAL LOW (ref 12.0–15.0)
Immature Granulocytes: 0 %
Lymphocytes Relative: 42 %
Lymphs Abs: 1.2 10*3/uL (ref 0.7–4.0)
MCH: 31.3 pg (ref 26.0–34.0)
MCHC: 34.1 g/dL (ref 30.0–36.0)
MCV: 92 fL (ref 80.0–100.0)
Monocytes Absolute: 0.2 10*3/uL (ref 0.1–1.0)
Monocytes Relative: 8 %
Neutro Abs: 1.4 10*3/uL — ABNORMAL LOW (ref 1.7–7.7)
Neutrophils Relative %: 48 %
Platelets: 160 10*3/uL (ref 150–400)
RBC: 3.48 MIL/uL — ABNORMAL LOW (ref 3.87–5.11)
RDW: 12.6 % (ref 11.5–15.5)
WBC: 2.9 10*3/uL — ABNORMAL LOW (ref 4.0–10.5)
nRBC: 0 % (ref 0.0–0.2)

## 2023-05-15 LAB — URINALYSIS, ROUTINE W REFLEX MICROSCOPIC
Bilirubin Urine: NEGATIVE
Glucose, UA: NEGATIVE mg/dL
Hgb urine dipstick: NEGATIVE
Ketones, ur: NEGATIVE mg/dL
Leukocytes,Ua: NEGATIVE
Nitrite: NEGATIVE
Protein, ur: NEGATIVE mg/dL
Specific Gravity, Urine: 1.01 (ref 1.005–1.030)
pH: 7 (ref 5.0–8.0)

## 2023-05-15 LAB — MAGNESIUM: Magnesium: 1.6 mg/dL — ABNORMAL LOW (ref 1.7–2.4)

## 2023-05-15 LAB — LIPASE, BLOOD: Lipase: 46 U/L (ref 11–51)

## 2023-05-15 MED ORDER — ONDANSETRON 4 MG PO TBDP: ORAL_TABLET | ORAL | 0 refills | Status: DC

## 2023-05-15 MED ORDER — ALENDRONATE SODIUM 70 MG PO TABS: ORAL_TABLET | ORAL | 11 refills | Status: DC

## 2023-05-15 MED ORDER — SODIUM CHLORIDE 0.9 % IV BOLUS
1000.0000 mL | Freq: Once | INTRAVENOUS | Status: AC
  Administered 2023-05-15: 1000 mL via INTRAVENOUS

## 2023-05-15 MED ORDER — ONDANSETRON HCL 4 MG/2ML IJ SOLN
4.0000 mg | Freq: Once | INTRAMUSCULAR | Status: AC
  Administered 2023-05-15: 4 mg via INTRAVENOUS
  Filled 2023-05-15: qty 2

## 2023-05-15 MED ORDER — MORPHINE SULFATE (PF) 2 MG/ML IV SOLN
2.0000 mg | Freq: Once | INTRAVENOUS | Status: AC
  Administered 2023-05-15: 2 mg via INTRAVENOUS
  Filled 2023-05-15: qty 1

## 2023-05-15 NOTE — Discharge Instructions (Signed)
Please follow-up with your family doctor and gastroenterologist.  Please return for inability to drink or if you develop a fever.

## 2023-05-15 NOTE — ED Notes (Signed)
Patient verbalizes understanding of discharge instructions. Opportunity for questioning and answers were provided. Patient discharged from ED.  °

## 2023-05-15 NOTE — ED Triage Notes (Signed)
Pt arrives to ED with c/o on-going rectal bleeding and weakness with associated abdominal cramping and nausea. Pts sister notes that pt is confused.

## 2023-05-15 NOTE — ED Provider Notes (Signed)
[place] EMERGENCY DEPARTMENT AT [facility] Provider Note   CSN: [id number] Arrival date & time: 05/15/23  0831     History  Chief Complaint  Patient presents with   Rectal Bleeding    Heather Gillespie is a 62 y.o. female.  62 yo F with a chief complaints of rectal bleeding.  This has been an ongoing issue for her but worsening over the past week.  Also has had intractable nausea and vomiting.  Go to the point where she does not think that she keep anything down orally including water for the past 24 to 48 hours.  No fevers.  No urinary symptoms.  She tells me the bleeding is from hemorrhoids.  She is seeing her gastroenterologist for this and her PCP.   Rectal Bleeding      Home Medications Prior to Admission medications   Medication Sig Start Date End Date Taking? Authorizing Provider  ondansetron (ZOFRAN-ODT) 4 MG disintegrating tablet 4mg  ODT q4 hours prn nausea/vomit 05/15/23  Yes Melene Plan, DO  alendronate (FOSAMAX) 70 MG tablet TAKE 1 TABLET BY MOUTH EVERY 7 DAYS. TAKE WITH A FULL GLASS OF WATER ON AN EMPTY STOMACH. 05/15/23   Donita Brooks, MD  cephALEXin (KEFLEX) 500 MG capsule Take 1 capsule (500 mg total) by mouth 4 (four) times daily. 02/11/23   Rafoth, Baldemar Friday, MD  cromolyn (OPTICROM) 4 % ophthalmic solution INSTILL 1 DROP INTO BOTH EYES 4 TIMES A DAY. STRENGTH: 4 % 04/06/23   Donita Brooks, MD  dexlansoprazole (DEXILANT) 60 MG capsule Take 60 mg by mouth daily.    [provider]  Ergocalciferol (VITAMIN D2) 2000 units TABS Take 1 tablet by mouth daily.    [provider]  fexofenadine (ALLEGRA) 180 MG tablet Take 1 tablet (180 mg total) by mouth daily. 08/12/22   Donita Brooks, MD  fluconazole (DIFLUCAN) 150 MG tablet TAKE 1 TABLET BY MOUTH AT START OF SYMPTOMS, MAY REPEAT 3 DAYS LATER 05/12/23   Donita Brooks, MD  fluticasone [facility]) 50 MCG/ACT nasal spray SPRAY 2 SPRAYS INTO EACH NOSTRIL EVERY DAY 08/15/22   Donita Brooks, MD  gabapentin (NEURONTIN) 300 MG capsule TAKE 1 CAPSULE BY MOUTH THREE TIMES A DAY 03/17/23   Donita Brooks, MD  hydrochlorothiazide (HYDRODIURIL) 25 MG tablet Take 1 tablet (25 mg total) by mouth daily. 02/13/23   Donita Brooks, MD  ibuprofen (ADVIL) 800 MG tablet Take 1 tablet (800 mg total) by mouth 3 (three) times daily. 06/22/22   Raspet, Noberto Retort, PA-C  levofloxacin (LEVAQUIN) 500 MG tablet Take 1 tablet (500 mg total) by mouth daily. 08/23/22   White, Elita Boone, NP  losartan (COZAAR) 100 MG tablet Take 1 tablet (100 mg total) by mouth daily. 04/07/23   Donita Brooks, MD  methocarbamol (ROBAXIN) 500 MG tablet Take 1 tablet (500 mg total) by mouth 2 (two) times daily. 06/22/22   Raspet, Erin K, PA-C  ondansetron (ZOFRAN) 4 MG tablet Take 1 tablet (4 mg total) by mouth every 6 (six) hours. 05/25/22   Dartha Lodge, PA-C  PATADAY 0.7 % SOLN INSTILL 1 DROP INTO BOTH EYES EVERY DAY 10/15/21   Donita Brooks, MD  potassium chloride SA (KLOR-CON M) 20 MEQ tablet Take 1 tablet (20 mEq total) by mouth daily. 06/05/22   Fayrene Helper, PA-C  Probiotic, Lactobacillus, CAPS Take 1 capsule by mouth daily. 10/23/22   Donita Brooks, MD  promethazine (PHENERGAN) 25 MG  tablet Take 1 tablet (25 mg total) by mouth every 6 (six) hours as needed for nausea or vomiting. 06/05/22   Fayrene Helper, PA-C  sulfamethoxazole-trimethoprim (BACTRIM DS) 800-160 MG tablet Take 1 tablet by mouth daily. 09/01/22   Donita Brooks, MD      Allergies    Hydroxyzine, Lotronex [alosetron hcl], Other, and Hydroxyzine pamoate    Review of Systems   Review of Systems  Gastrointestinal:  Positive for hematochezia.    Physical Exam Updated Vital Signs BP 105/68   Pulse 65   Temp 98.3 F (36.8 C) (Oral)   Resp 18   Ht 5\' 2"  (1.575 m)   Wt 54.9 kg   SpO2 97%   BMI 22.14 kg/m  Physical Exam Vitals and nursing note reviewed.  Constitutional:      General: She is not in acute distress.    Appearance: She  is well-developed. She is not diaphoretic.  HENT:     Head: Normocephalic and atraumatic.  Eyes:     Pupils: Pupils are equal, round, and reactive to light.  Cardiovascular:     Rate and Rhythm: Normal rate and regular rhythm.     Heart sounds: No murmur heard.    No friction rub. No gallop.  Pulmonary:     Effort: Pulmonary effort is normal.     Breath sounds: No wheezing or rales.  Abdominal:     General: There is no distension.     Palpations: Abdomen is soft.     Tenderness: There is no abdominal tenderness.  Genitourinary:    Comments: No appreciable hemorrhoids on exam.  No stool in the vault no gross blood Musculoskeletal:        General: No tenderness.     Cervical back: Normal range of motion and neck supple.  Skin:    General: Skin is warm and dry.  Neurological:     Mental Status: She is alert and oriented to person, place, and time.  Psychiatric:        Behavior: Behavior normal.     ED Results / Procedures / Treatments   Labs (all labs ordered are listed, but only abnormal results are displayed) Labs Reviewed  CBC WITH DIFFERENTIAL/PLATELET - Abnormal; Notable for the following components:      Result Value   WBC 2.9 (*)    RBC 3.48 (*)    Hemoglobin 10.9 (*)    HCT 32.0 (*)    Neutro Abs 1.4 (*)    All other components within normal limits  COMPREHENSIVE METABOLIC PANEL - Abnormal; Notable for the following components:   Sodium 133 (*)    Creatinine, Ser 1.15 (*)    GFR, Estimated 54 (*)    All other components within normal limits  MAGNESIUM - Abnormal; Notable for the following components:   Magnesium 1.6 (*)    All other components within normal limits  LIPASE, BLOOD  URINALYSIS, ROUTINE W REFLEX MICROSCOPIC    EKG None  Radiology No results found.  Procedures Procedures    Medications Ordered in ED Medications  sodium chloride 0.9 % bolus 1,000 mL ( Intravenous Stopped 05/15/23 1047)  ondansetron (ZOFRAN) injection 4 mg (4 mg  Intravenous Given 05/15/23 0947)  morphine (PF) 2 MG/ML injection 2 mg (2 mg Intravenous Given 05/15/23 0948)  sodium chloride 0.9 % bolus 1,000 mL (0 mLs Intravenous Stopped 05/15/23 1250)    ED Course/ Medical Decision Making/ A&P  Medical Decision Making Amount and/or Complexity of Data Reviewed Labs: ordered.  Risk Prescription drug management.   62 yo F with a chief complaints of bright red blood per rectum.  This has been an ongoing issue for her.  Going on for months.  Has seen her family doctor as well as gastroenterology and has had sigmoidoscopy performed without obvious etiology of her symptoms.  She tells me it has been worse over this past week.  Has had nausea and vomiting as well.  Tells me she has been able to keep anything down in a few days.  Will give a bolus of IV fluids check electrolytes reassess.  Patient's white count is low, hemoglobin is higher than her baseline.  UA negative for infection.  Sodium level is very mildly low.  LFTs lipase unremarkable.  Mag level is very mildly low.  I discussed the results with the patient.  She is feeling quite a bit better.  Tolerate by mouth without issue.  Will discharge home.  PCP follow-up.    I have discussed the diagnosis/risks/treatment options with the patient and friend .  Evaluation and diagnostic testing in the emergency department does not suggest an emergent condition requiring admission or immediate intervention beyond what has been performed at this time.  They will follow up with PCP, GI. We also discussed returning to the ED immediately if new or worsening sx occur. We discussed the sx which are most concerning (e.g., sudden worsening pain, fever, inability to tolerate by mouth) that necessitate immediate return. Medications administered to the patient during their visit and any new prescriptions provided to the patient are listed below.  Medications given during this visit Medications   sodium chloride 0.9 % bolus 1,000 mL ( Intravenous Stopped 05/15/23 1047)  ondansetron (ZOFRAN) injection 4 mg (4 mg Intravenous Given 05/15/23 0947)  morphine (PF) 2 MG/ML injection 2 mg (2 mg Intravenous Given 05/15/23 0948)  sodium chloride 0.9 % bolus 1,000 mL (0 mLs Intravenous Stopped 05/15/23 1250)     The patient appears reasonably screen and/or stabilized for discharge and I doubt any other medical condition or other [facility] requiring further screening, evaluation, or treatment in the ED at this time prior to discharge.          Final Clinical Impression(s) / ED Diagnoses Final diagnoses:  Nausea and vomiting in adult  Rectal bleeding    Rx / DC Orders ED Discharge Orders          Ordered    ondansetron (ZOFRAN-ODT) 4 MG disintegrating tablet        05/15/23 1236              Melene Plan, DO 05/15/23 1345

## 2023-06-11 ENCOUNTER — Other Ambulatory Visit: Payer: Self-pay | Admitting: Family Medicine

## 2023-06-11 DIAGNOSIS — Z1231 Encounter for screening mammogram for malignant neoplasm of breast: Secondary | ICD-10-CM

## 2023-06-11 DIAGNOSIS — M810 Age-related osteoporosis without current pathological fracture: Secondary | ICD-10-CM

## 2023-06-22 ENCOUNTER — Encounter: Payer: PRIVATE HEALTH INSURANCE | Admitting: Family Medicine

## 2023-06-24 ENCOUNTER — Ambulatory Visit (INDEPENDENT_AMBULATORY_CARE_PROVIDER_SITE_OTHER): Payer: Medicaid Other | Admitting: Family Medicine

## 2023-06-24 ENCOUNTER — Encounter: Payer: Self-pay | Admitting: Family Medicine

## 2023-06-24 VITALS — BP 116/64 | HR 60 | Temp 98.1°F | Ht 62.0 in | Wt 124.0 lb

## 2023-06-24 DIAGNOSIS — R310 Gross hematuria: Secondary | ICD-10-CM

## 2023-06-24 DIAGNOSIS — K625 Hemorrhage of anus and rectum: Secondary | ICD-10-CM

## 2023-06-24 DIAGNOSIS — Z0001 Encounter for general adult medical examination with abnormal findings: Secondary | ICD-10-CM

## 2023-06-24 DIAGNOSIS — N838 Other noninflammatory disorders of ovary, fallopian tube and broad ligament: Secondary | ICD-10-CM | POA: Diagnosis not present

## 2023-06-24 DIAGNOSIS — Z23 Encounter for immunization: Secondary | ICD-10-CM

## 2023-06-24 DIAGNOSIS — Z0000 Encounter for general adult medical examination without abnormal findings: Secondary | ICD-10-CM

## 2023-06-24 DIAGNOSIS — I10 Essential (primary) hypertension: Secondary | ICD-10-CM

## 2023-06-24 DIAGNOSIS — N390 Urinary tract infection, site not specified: Secondary | ICD-10-CM

## 2023-06-24 DIAGNOSIS — M810 Age-related osteoporosis without current pathological fracture: Secondary | ICD-10-CM

## 2023-06-24 NOTE — Addendum Note (Signed)
Addended by: Venia Carbon K on: 06/24/2023 09:40 AM   Modules accepted: Orders

## 2023-06-24 NOTE — Progress Notes (Signed)
Subjective:    Patient ID: Heather Gillespie, female    DOB: 05/03/1961, 62 y.o.   MRN: [id number]  HPI  Patient is here today for complete physical exam.  She is due for a flu shot as well as the shingles vaccine.  She declines both of those due to her family history of Guillain-Barr syndrome in her mom.  She declines a COVID shot.  Her mammogram has been scheduled for November.  Over the last year he was dealing Breeback bleeding he had a slight [place] timbre 2023 that revealed a rectal ulcer most likely caused by the rectal prolapse.  Biopsies were negative for any cancer.  She follows up regularly with GI.  She also sees urology who performed cystoscopy.  Biopsies were negative for any malignancy in the bladder.  This was due to recurrent urinary tract infections and hematuria.  However since she started Bactrim tablets once daily as a prophylactic, she has not had any additional urinary tract infections and her quality of life has improved dramatically.  She was also seeing gynecology for an ovarian cyst.  However at her last visit with her gynecologist, her gynecologist released her as the ovarian cyst and dramatically reduced in size per the patient's report and was almost nonexistent.  Her Pap smear was performed last year and was negative.  Bone density is scheduled for stable.  She has been approximately also vitamin D.  Her blood pressure today is a little bit low.  She does report increased urinary frequency and thirst. Past Medical History:  Diagnosis Date   Abdominal pain    Allergy    GERD (gastroesophageal reflux disease)    Glaucoma    Hemorrhoids    Hiatal hernia    HTN (hypertension)    IBS (irritable bowel syndrome)    N&V (nausea and vomiting)    Osteoporosis    Ovarian cyst    Rectal bleeding    Rectal ulcer    Sinus problem    TMJ (dislocation of temporomandibular joint)    Vitamin D deficiency    Past Surgical History:  Procedure Laterality Date   ANAL FISSURE  REPAIR     BACK SURGERY     CHOLECYSTECTOMY  1984   COLONOSCOPY W/ POLYPECTOMY     colon ulcers   EYE SURGERY     FOOT SURGERY     HEMI-MICRODISCECTOMY LUMBAR LAMINECTOMY LEVEL 1 Left 05/26/2013   Procedure: HEMI-MICRODISCECTOMY LUMBAR LAMINECTOMY L4 - L5 ON THE LEFT LEVEL 1;  Surgeon: Jacki Cones, MD;  Location: [facility];  Service: Orthopedics;  Laterality: Left;   HEMORROIDECTOMY     Current Outpatient Medications on File Prior to Visit  Medication Sig Dispense Refill   alendronate (FOSAMAX) 70 MG tablet TAKE 1 TABLET BY MOUTH EVERY 7 DAYS. TAKE WITH A FULL GLASS OF WATER ON AN EMPTY STOMACH. 4 tablet 11   cephALEXin (KEFLEX) 500 MG capsule Take 1 capsule (500 mg total) by mouth 4 (four) times daily. 20 capsule 0   cromolyn (OPTICROM) 4 % ophthalmic solution INSTILL 1 DROP INTO BOTH EYES 4 TIMES A DAY. STRENGTH: 4 % 10 mL 2   dexlansoprazole (DEXILANT) 60 MG capsule Take 60 mg by mouth daily.     Ergocalciferol (VITAMIN D2) 2000 units TABS Take 1 tablet by mouth daily.     fexofenadine (ALLEGRA) 180 MG tablet Take 1 tablet (180 mg total) by mouth daily. 90 tablet 3   fluconazole (DIFLUCAN) 150 MG tablet TAKE 1  TABLET BY MOUTH AT START OF SYMPTOMS, MAY REPEAT 3 DAYS LATER 2 tablet 0   fluticasone (FLONASE) 50 MCG/ACT nasal spray SPRAY 2 SPRAYS INTO EACH NOSTRIL EVERY DAY 48 mL 3   gabapentin (NEURONTIN) 300 MG capsule TAKE 1 CAPSULE BY MOUTH THREE TIMES A DAY 90 capsule 5   hydrochlorothiazide (HYDRODIURIL) 25 MG tablet Take 1 tablet (25 mg total) by mouth daily. 90 tablet 1   ibuprofen (ADVIL) 800 MG tablet Take 1 tablet (800 mg total) by mouth 3 (three) times daily. 15 tablet 0   levofloxacin (LEVAQUIN) 500 MG tablet Take 1 tablet (500 mg total) by mouth daily. 7 tablet 0   losartan (COZAAR) 100 MG tablet Take 1 tablet (100 mg total) by mouth daily. 90 tablet 1   methocarbamol (ROBAXIN) 500 MG tablet Take 1 tablet (500 mg total) by mouth 2 (two) times daily. 10 tablet 0   ondansetron  (ZOFRAN) 4 MG tablet Take 1 tablet (4 mg total) by mouth every 6 (six) hours. 12 tablet 0   ondansetron (ZOFRAN-ODT) 4 MG disintegrating tablet 4mg  ODT q4 hours prn nausea/vomit 20 tablet 0   PATADAY 0.7 % SOLN INSTILL 1 DROP INTO BOTH EYES EVERY DAY 2.5 mL 3   potassium chloride SA (KLOR-CON M) 20 MEQ tablet Take 1 tablet (20 mEq total) by mouth daily. 3 tablet 0   Probiotic, Lactobacillus, CAPS Take 1 capsule by mouth daily. 30 capsule 5   promethazine (PHENERGAN) 25 MG tablet Take 1 tablet (25 mg total) by mouth every 6 (six) hours as needed for nausea or vomiting. 10 tablet 0   sulfamethoxazole-trimethoprim (BACTRIM DS) 800-160 MG tablet Take 1 tablet by mouth daily. 30 tablet 11   No current facility-administered medications on file prior to visit.   Allergies  Allergen Reactions   Hydroxyzine     Other reaction(s): Unknown   Lotronex [Alosetron Hcl]     Other reaction(s): Unknown   Other Other (See Comments)   Hydroxyzine Pamoate Rash   Social History   Socioeconomic History   Marital status: Single    Spouse name: Not on file   Number of children: Not on file   Years of education: Not on file   Highest education level: Not on file  Occupational History   Not on file  Tobacco Use   Smoking status: Never   Smokeless tobacco: Never  Vaping Use   Vaping status: Never Used  Substance and Sexual Activity   Alcohol use: No   Drug use: No   Sexual activity: Not Currently    Birth control/protection: Post-menopausal  Other Topics Concern   Not on file  Social History Narrative   Not on file   Social Determinants of Health   Financial Resource Strain: Low Risk  (08/14/2022)   Overall Financial Resource Strain (CARDIA)    Difficulty of Paying Living Expenses: Not very hard  Food Insecurity: No Food Insecurity (08/14/2022)   Hunger Vital Sign    Worried About Running Out of Food in the Last Year: Never true    Ran Out of Food in the Last Year: Never true   Transportation Needs: No Transportation Needs (08/14/2022)   PRAPARE - Administrator, Civil Service (Medical): No    Lack of Transportation (Non-Medical): No  Physical Activity: Inactive (08/14/2022)   Exercise Vital Sign    Days of Exercise per Week: 0 days    Minutes of Exercise per Session: 0 min  Stress: No Stress Concern Present (  08/14/2022)   [employer] of Occupational Health - Occupational Stress Questionnaire    Feeling of Stress : Not at all  Social Connections: Socially Isolated (08/14/2022)   Social Connection and Isolation Panel [NHANES]    Frequency of Communication with Friends and Family: More than three times a week    Frequency of Social Gatherings with Friends and Family: Twice a week    Attends Religious Services: Never    Database administrator or Organizations: No    Attends Banker Meetings: Never    Marital Status: Never married  Intimate Partner Violence: Not At Risk (08/14/2022)   Humiliation, Afraid, Rape, and Kick questionnaire    Fear of Current or Ex-Partner: No    Emotionally Abused: No    Physically Abused: No    Sexually Abused: No   Family History  Problem Relation Age of Onset   Diabetes Mother    Cancer Mother    Stroke Father    Breast cancer Neg Hx       Review of Systems  All other systems reviewed and are negative.      Objective:   Physical Exam Vitals reviewed.  Constitutional:      General: She is not in acute distress.    Appearance: Normal appearance. She is well-developed. She is not ill-appearing, toxic-appearing or diaphoretic.  HENT:     Head: Normocephalic and atraumatic.     Right Ear: Tympanic membrane, ear canal and external ear normal.     Left Ear: Tympanic membrane, ear canal and external ear normal.     Nose: Nose normal. No congestion or rhinorrhea.     Mouth/Throat:     Mouth: Mucous membranes are moist.     Pharynx: No oropharyngeal exudate or posterior oropharyngeal  erythema.  Eyes:     General: No scleral icterus.       Right eye: No discharge.        Left eye: No discharge.     Extraocular Movements: Extraocular movements intact.     Conjunctiva/sclera: Conjunctivae normal.     Pupils: Pupils are equal, round, and reactive to light.  Neck:     Thyroid: No thyromegaly.     Vascular: No carotid bruit or JVD.     Trachea: No tracheal deviation.  Cardiovascular:     Rate and Rhythm: Normal rate and regular rhythm.     Pulses: Normal pulses.     Heart sounds: Normal heart sounds. No murmur heard.    No friction rub. No gallop.  Pulmonary:     Effort: Pulmonary effort is normal. No respiratory distress.     Breath sounds: Normal breath sounds. No stridor. No wheezing, rhonchi or rales.  Chest:     Chest wall: No tenderness.  Abdominal:     General: Bowel sounds are normal. There is no distension.     Palpations: Abdomen is soft. There is no mass.     Tenderness: There is no abdominal tenderness. There is no right CVA tenderness, left CVA tenderness, guarding or rebound.     Hernia: No hernia is present.  Genitourinary:    Cervix: No cervical motion tenderness, discharge or friability.     Adnexa:        Right: No mass or tenderness.         Left: No mass or tenderness.    Musculoskeletal:        General: No tenderness. Normal range of motion.     Cervical  back: Normal range of motion and neck supple. No rigidity or tenderness.     Right lower leg: No edema.     Left lower leg: No edema.  Lymphadenopathy:     Cervical: No cervical adenopathy.  Skin:    General: Skin is warm.     Coloration: Skin is not jaundiced or pale.     Findings: No bruising, erythema, lesion or rash.  Neurological:     General: No focal deficit present.     Mental Status: She is alert and oriented to person, place, and time. Mental status is at baseline.     Cranial Nerves: No cranial nerve deficit.     Sensory: No sensory deficit.     Motor: No weakness or  abnormal muscle tone.     Coordination: Coordination normal.     Gait: Gait normal.     Deep Tendon Reflexes: Reflexes are normal and symmetric. Reflexes normal.  Psychiatric:        Mood and Affect: Mood normal.        Behavior: Behavior normal.        Thought Content: Thought content normal.        Judgment: Judgment normal.           Assessment & Plan:  Benign essential HTN - Plan: COMPLETE METABOLIC PANEL WITH GFR, CBC with Differential/Platelet, Lipid panel  General medical exam  Ovarian mass, right  Gross hematuria  Recurrent UTI  Osteoporosis, unspecified osteoporosis type, unspecified pathological fracture presence  Rectal bleeding Recommended a flu shot, COVID shot, shingles shot, and a tetanus shot.  The patient received her tetanus shot but declined all others.  Blood pressure is low so I recommended she discontinue her potassium supplement and hydrochlorothiazide to avoid dehydration.  She can even p.o. The past that was felt to be a benign cyst as this is almost completely resolved.  Her hematuria has resolved after we have treated the frequent urinary tract infections by starting her on prophylactic antibiotic/Bactrim.  We have discussed the risk and benefits of this and I feel the benefits outweigh this for this patient.  Cystoscopy last year was negative for any malignancy.  Rectal bleeding is due to a rectal ulcer as well as hemorrhoids.  Sigmoidoscopy was performed in September 2023 and was negative for any malignancy.  She follows up regularly with GI.  She is due for repeat bone density this spring.  Regular anticipatory guidance is provided.

## 2023-06-25 ENCOUNTER — Other Ambulatory Visit: Payer: Self-pay

## 2023-06-25 LAB — CBC WITH DIFFERENTIAL/PLATELET
Absolute Monocytes: 252 {cells}/uL (ref 200–950)
Basophils Absolute: 21 {cells}/uL (ref 0–200)
Basophils Relative: 0.7 %
Eosinophils Absolute: 30 {cells}/uL (ref 15–500)
Eosinophils Relative: 1 %
HCT: 35.2 % (ref 35.0–45.0)
Hemoglobin: 11.7 g/dL (ref 11.7–15.5)
Lymphs Abs: 1116 {cells}/uL (ref 850–3900)
MCH: 31 pg (ref 27.0–33.0)
MCHC: 33.2 g/dL (ref 32.0–36.0)
MCV: 93.4 fL (ref 80.0–100.0)
MPV: 10.5 fL (ref 7.5–12.5)
Monocytes Relative: 8.4 %
Neutro Abs: 1581 {cells}/uL (ref 1500–7800)
Neutrophils Relative %: 52.7 %
Platelets: 186 10*3/uL (ref 140–400)
RBC: 3.77 10*6/uL — ABNORMAL LOW (ref 3.80–5.10)
RDW: 12.4 % (ref 11.0–15.0)
Total Lymphocyte: 37.2 %
WBC: 3 10*3/uL — ABNORMAL LOW (ref 3.8–10.8)

## 2023-06-25 LAB — LIPID PANEL
Cholesterol: 244 mg/dL — ABNORMAL HIGH (ref <200)
HDL: 75 mg/dL (ref >=50)
LDL Cholesterol (Calc): 153 mg/dL — ABNORMAL HIGH
Non-HDL Cholesterol (Calc): 169 mg/dL — ABNORMAL HIGH (ref <130)
Total CHOL/HDL Ratio: 3.3 (calc) (ref <5.0)
Triglycerides: 69 mg/dL (ref <150)

## 2023-06-25 LAB — COMPLETE METABOLIC PANEL WITH GFR
AG Ratio: 1.6 (calc) (ref 1.0–2.5)
ALT: 8 U/L (ref 6–29)
AST: 20 U/L (ref 10–35)
Albumin: 4.7 g/dL (ref 3.6–5.1)
Alkaline phosphatase (APISO): 46 U/L (ref 37–153)
BUN/Creatinine Ratio: 15 (calc) (ref 6–22)
BUN: 18 mg/dL (ref 7–25)
CO2: 23 mmol/L (ref 20–32)
Calcium: 10.2 mg/dL (ref 8.6–10.4)
Chloride: 99 mmol/L (ref 98–110)
Creat: 1.21 mg/dL — ABNORMAL HIGH (ref 0.50–1.05)
Globulin: 3 g/dL (ref 1.9–3.7)
Glucose, Bld: 85 mg/dL (ref 65–99)
Potassium: 4.3 mmol/L (ref 3.5–5.3)
Sodium: 133 mmol/L — ABNORMAL LOW (ref 135–146)
Total Bilirubin: 0.6 mg/dL (ref 0.2–1.2)
Total Protein: 7.7 g/dL (ref 6.1–8.1)
eGFR: 50 mL/min/{1.73_m2} — ABNORMAL LOW (ref >=60)

## 2023-06-25 MED ORDER — ROSUVASTATIN CALCIUM 10 MG PO TABS: 10.0000 mg | ORAL_TABLET | Freq: Every day | ORAL | 3 refills | Status: DC

## 2023-06-26 ENCOUNTER — Other Ambulatory Visit: Payer: Self-pay | Admitting: Family Medicine

## 2023-06-26 NOTE — Telephone Encounter (Signed)
Requested medication (s) are due for refill today - no  Requested medication (s) are on the active medication list -yes  Future visit scheduled -yes  Last refill: 09/01/22 #30 11RF  Notes to clinic: non delegated Rx  Requested Prescriptions  Pending Prescriptions Disp Refills   sulfamethoxazole-trimethoprim (BACTRIM DS) 800-160 MG tablet [Pharmacy Med Name: SULFAMETHOXAZOLE-TMP DS TABLET] 30 tablet 11    Sig: TAKE 1 TABLET BY MOUTH EVERY DAY     Off-Protocol Failed - 06/26/2023 12:41 PM      Failed - Medication not assigned to a protocol, review manually.      Failed - Valid encounter within last 12 months    Recent Outpatient Visits           1 year ago Osteoporosis, unspecified osteoporosis type, unspecified pathological fracture presence   [facility] Pickard, Priscille Heidelberg, MD   2 years ago Gross hematuria   [facility] Tanya Nones, Priscille Heidelberg, MD   2 years ago General medical exam   [facility] Donita Brooks, MD   3 years ago General medical exam   [facility] Donita Brooks, MD   4 years ago General medical exam   [facility] Donita Brooks, MD       Future Appointments             In 12 months Pickard, Priscille Heidelberg, MD [facility], [facility]               Requested Prescriptions  Pending Prescriptions Disp Refills   sulfamethoxazole-trimethoprim (BACTRIM DS) 800-160 MG tablet [Pharmacy Med Name: SULFAMETHOXAZOLE-TMP DS TABLET] 30 tablet 11    Sig: TAKE 1 TABLET BY MOUTH EVERY DAY     Off-Protocol Failed - 06/26/2023 12:41 PM      Failed - Medication not assigned to a protocol, review manually.      Failed - Valid encounter within last 12 months    Recent Outpatient Visits           1 year ago Osteoporosis, unspecified osteoporosis type, unspecified pathological fracture presence   [facility] Pickard, Priscille Heidelberg, MD   2 years ago  Gross hematuria   [facility] Tanya Nones, Priscille Heidelberg, MD   2 years ago General medical exam   [facility] Donita Brooks, MD   3 years ago General medical exam   [facility] Donita Brooks, MD   4 years ago General medical exam   [facility] Donita Brooks, MD       Future Appointments             In 12 months Pickard, Priscille Heidelberg, MD [facility], [facility]

## 2023-07-07 ENCOUNTER — Other Ambulatory Visit: Payer: Self-pay

## 2023-07-07 NOTE — Telephone Encounter (Signed)
Prescription Request  07/07/2023  LOV: 09/01/22  What is the name of the medication or equipment? fexofenadine (ALLEGRA) 180 MG tablet [[id number]]   Have you contacted your pharmacy to request a refill? Yes   Which pharmacy would you like this sent to?  [id number] [place], [place] - [address] [place] [address] Phone: [phone] Fax: [phone]    Patient notified that their request is being sent to the clinical staff for review and that they should receive a response within 2 business days.   Please advise at [facility] [phone]

## 2023-07-08 MED ORDER — FEXOFENADINE HCL 180 MG PO TABS: 180.0000 mg | ORAL_TABLET | Freq: Every day | ORAL | 0 refills | Status: DC

## 2023-07-08 NOTE — Telephone Encounter (Signed)
Requested Prescriptions  Pending Prescriptions Disp Refills   fexofenadine (ALLEGRA) 180 MG tablet 90 tablet 0    Sig: Take 1 tablet (180 mg total) by mouth daily.     Ear, Nose, and Throat:  Antihistamines Failed - 07/07/2023 10:07 AM      Failed - Valid encounter within last 12 months    Recent Outpatient Visits           1 year ago Osteoporosis, unspecified osteoporosis type, unspecified pathological fracture presence   [facility] Pickard, Priscille Heidelberg, MD   2 years ago Gross hematuria   [facility] Tanya Nones, Priscille Heidelberg, MD   2 years ago General medical exam   [facility] Donita Brooks, MD   3 years ago General medical exam   [facility] Donita Brooks, MD   4 years ago General medical exam   [facility] Donita Brooks, MD       Future Appointments             In 11 months Pickard, Priscille Heidelberg, MD [facility], [facility]

## 2023-07-16 ENCOUNTER — Other Ambulatory Visit: Payer: Self-pay | Admitting: Family Medicine

## 2023-07-16 MED ORDER — FLUTICASONE PROPIONATE 50 MCG/ACT NA SUSP: 2.0000 | Freq: Every day | NASAL | 3 refills | Status: DC

## 2023-07-16 NOTE — Telephone Encounter (Signed)
Requested Prescriptions  Pending Prescriptions Disp Refills   fluticasone (FLONASE) 50 MCG/ACT nasal spray 48 mL 3    Sig: Place 2 sprays into both nostrils daily.     Ear, Nose, and Throat: Nasal Preparations - Corticosteroids Failed - 07/16/2023 10:05 AM      Failed - Valid encounter within last 12 months    Recent Outpatient Visits           1 year ago Osteoporosis, unspecified osteoporosis type, unspecified pathological fracture presence   [facility] Pickard, Priscille Heidelberg, MD   2 years ago Gross hematuria   [facility] Tanya Nones, Priscille Heidelberg, MD   2 years ago General medical exam   [facility] Donita Brooks, MD   3 years ago General medical exam   [facility] Donita Brooks, MD   4 years ago General medical exam   [facility] Family Medicine Donita Brooks, MD       Future Appointments             In 11 months Pickard, Priscille Heidelberg, MD [facility], [facility]

## 2023-07-16 NOTE — Telephone Encounter (Signed)
Prescription Request  07/16/2023  LOV: 06/24/2023  What is the name of the medication or equipment? fluticasone (FLONASE) 50 MCG/ACT nasal spray   Have you contacted your pharmacy to request a refill? Yes   Which pharmacy would you like this sent to?  [id number] [place], [place] - [address] [place] [address] Phone: [phone] Fax: [phone]    Patient notified that their request is being sent to the clinical staff for review and that they should receive a response within 2 business days.   Please advise at [facility] [phone]

## 2023-07-22 ENCOUNTER — Other Ambulatory Visit: Payer: Self-pay | Admitting: Family Medicine

## 2023-07-22 ENCOUNTER — Other Ambulatory Visit: Payer: Self-pay

## 2023-07-22 NOTE — Telephone Encounter (Unsigned)
Copied from CRM [id number]. Topic: Clinical - Medication Refill >> Jul 22, 2023  8:32 AM Almira Coaster wrote: Most Recent Primary Care Visit:  Provider: Lynnea Ferrier T  [facility]  Visit Type: PHYSICAL  Date: 06/24/2023  Medication:  Sulfamethoxazole-trimethoprim    Has the patient contacted their pharmacy? Yes (Agent: If no, request that the patient contact the pharmacy for the refill. If patient does not wish to contact the pharmacy document the reason why and proceed with request.) (Agent: If yes, when and what did the pharmacy advise?)  Has the prescription been filled recently? No  Is the patient out of the medication? Yes  Has the patient been seen for an appointment in the last year OR does the patient have an upcoming appointment? Yes  Can we respond through MyChart? No  Agent: Please be advised that Rx refills may take up to 3 business days. We ask that you follow-up with your pharmacy.

## 2023-07-22 NOTE — Telephone Encounter (Signed)
Copied from CRM [id number]. Topic: Clinical - Medication Refill >> Jul 22, 2023  8:32 AM Almira Coaster wrote: Most Recent Primary Care Visit:  Provider: Lynnea Ferrier T  Department: [facility]  Visit Type: PHYSICAL  Date: 06/24/2023  Medication:  Sulfamethoxazole-trimethoprim    Has the patient contacted their pharmacy? Yes (Agent: If no, request that the patient contact the pharmacy for the refill. If patient does not wish to contact the pharmacy document the reason why and proceed with request.) (Agent: If yes, when and what did the pharmacy advise?)  Has the prescription been filled recently? No  Is the patient out of the medication? Yes  Has the patient been seen for an appointment in the last year OR does the patient have an upcoming appointment? Yes  Can we respond through MyChart? No  Agent: Please be advised that Rx refills may take up to 3 business days. We ask that you follow-up with your pharmacy.

## 2023-07-22 NOTE — Telephone Encounter (Signed)
Copied from CRM [id number]. Topic: Clinical - Medication Refill >> Jul 22, 2023  8:32 AM Almira Coaster wrote: Most Recent Primary Care Visit:  Provider: Lynnea Ferrier T  Department: [facility]  Visit Type: PHYSICAL  Date: 06/24/2023  Medication: ***  Has the patient contacted their pharmacy?  (Agent: If no, request that the patient contact the pharmacy for the refill. If patient does not wish to contact the pharmacy document the reason why and proceed with request.) (Agent: If yes, when and what did the pharmacy advise?)  Has the prescription been filled recently?   Is the patient out of the medication?   Has the patient been seen for an appointment in the last year OR does the patient have an upcoming appointment?   Can we respond through MyChart?   Agent: Please be advised that Rx refills may take up to 3 business days. We ask that you follow-up with your pharmacy.

## 2023-07-23 MED ORDER — SULFAMETHOXAZOLE-TRIMETHOPRIM 800-160 MG PO TABS: 1.0000 | ORAL_TABLET | Freq: Every day | ORAL | 11 refills | Status: DC

## 2023-07-24 ENCOUNTER — Ambulatory Visit
Admission: RE | Admit: 2023-07-24 | Discharge: 2023-07-24 | Disposition: A | Discharge status: Home / Self Care | Payer: Medicaid Other | Source: Ambulatory Visit | Attending: Family Medicine | Admitting: Family Medicine

## 2023-07-24 DIAGNOSIS — Z1231 Encounter for screening mammogram for malignant neoplasm of breast: Secondary | ICD-10-CM

## 2023-08-18 ENCOUNTER — Other Ambulatory Visit: Payer: Self-pay | Admitting: Family Medicine

## 2023-08-18 ENCOUNTER — Telehealth: Payer: Self-pay

## 2023-08-18 MED ORDER — FLUCONAZOLE 150 MG PO TABS: 150.0000 mg | ORAL_TABLET | Freq: Once | ORAL | 0 refills | Status: DC

## 2023-08-18 NOTE — Telephone Encounter (Signed)
Received fax from pt's pharmacy requesting refill for medication, fluconazole (DIFLUCAN) 150 MG tablet [[id number]]  ENDED   However, medication is not listed on current list. Last refill was 12/23 per pcp?     Please advise.

## 2023-08-21 ENCOUNTER — Other Ambulatory Visit: Payer: Self-pay

## 2023-08-21 MED ORDER — CROMOLYN SODIUM 4 % OP SOLN: OPHTHALMIC | 2 refills | Status: DC

## 2023-09-02 ENCOUNTER — Other Ambulatory Visit: Payer: Self-pay

## 2023-09-02 NOTE — Telephone Encounter (Signed)
Prescription Request  09/02/2023  LOV: 06/24/23  What is the name of the medication or equipment? gabapentin (NEURONTIN) 300 MG capsule [[id number]]   Have you contacted your pharmacy to request a refill? Yes   Which pharmacy would you like this sent to?  [id number] [place], [place] - [address] [place] [address] Phone: [phone] Fax: [phone]    Patient notified that their request is being sent to the clinical staff for review and that they should receive a response within 2 business days.   Please advise at [facility] [phone]

## 2023-09-03 MED ORDER — GABAPENTIN 300 MG PO CAPS: ORAL_CAPSULE | ORAL | 5 refills | Status: DC

## 2023-09-03 NOTE — Telephone Encounter (Signed)
Requested Prescriptions  Pending Prescriptions Disp Refills   gabapentin (NEURONTIN) 300 MG capsule 90 capsule 5    Sig: TAKE 1 CAPSULE BY MOUTH THREE TIMES A DAY     Neurology: Anticonvulsants - gabapentin Failed - 09/03/2023  7:50 AM      Failed - Cr in normal range and within 360 days    Creat  Date Value Ref Range Status  06/24/2023 1.21 (H) 0.50 - 1.05 mg/dL Final         Failed - Valid encounter within last 12 months    Recent Outpatient Visits           1 year ago Osteoporosis, unspecified osteoporosis type, unspecified pathological fracture presence   [facility] Donita Brooks, MD   2 years ago Gross hematuria   [facility] Tanya Nones, Priscille Heidelberg, MD   2 years ago General medical exam   [facility] Donita Brooks, MD   3 years ago General medical exam   [facility] Donita Brooks, MD   4 years ago General medical exam   [facility] Donita Brooks, MD       Future Appointments             In 9 months Pickard, Priscille Heidelberg, MD [facility], [facility]            Passed - Completed PHQ-2 or PHQ-9 in the last 360 days

## 2023-09-07 ENCOUNTER — Telehealth: Payer: Self-pay

## 2023-09-07 NOTE — Telephone Encounter (Signed)
Prescription Request  09/07/2023  LOV: 06/24/23 CPE  What is the name of the medication or equipment? hydrochlorothiazide (HYDRODIURIL) 25 MG tablet [[id number]]  DISCONTINUED   Have you contacted your pharmacy to request a refill? No   Which pharmacy would you like this sent to?  [id number] [place], [place] - [address] [place] [address] Phone: [phone] Fax: [phone]    Patient notified that their request is being sent to the clinical staff for review and that they should receive a response within 2 business days.   Please advise at [facility] [phone]

## 2023-09-10 ENCOUNTER — Other Ambulatory Visit: Payer: Self-pay | Admitting: *Deleted

## 2023-09-10 ENCOUNTER — Other Ambulatory Visit: Payer: Self-pay | Admitting: Family Medicine

## 2023-09-10 DIAGNOSIS — I10 Essential (primary) hypertension: Secondary | ICD-10-CM

## 2023-09-10 NOTE — Telephone Encounter (Signed)
Prescription Request  09/10/2023  LOV: 06/24/2023  What is the name of the medication or equipment?   losartan (COZAAR) 100 MG tablet   **90 day refill requested**  Have you contacted your pharmacy to request a refill? Yes   Which pharmacy would you like this sent to?  [id number] [place], [place] - [address] [place] [address] [place] [address] Phone: [phone] Fax: [phone]    Patient notified that their request is being sent to the clinical staff for review and that they should receive a response within 2 business days.   Please advise pharmacist.

## 2023-09-10 NOTE — Telephone Encounter (Signed)
Pharmacy requesting 90 day script for the hydrochlorothiazide (HYDRODIURIL) 25 MG tablet [[id number]]  DISCONTINUED  Please see previous message in thread. Please advise pharmacist.

## 2023-09-10 NOTE — Telephone Encounter (Signed)
Reached pt. Appears confused about meds.  States she is out of hydrochlorothiazide, states has been taking as needed, thenstates has been out since [employer]. . Not on current med profile. Also states Crestor has been making her joints ache. States she took Lisinopril before and did well with that. Advised Lisinopril is a BP med. States her BP was low at last visit. Advised may be reason for med adjustments. Also states needs refill of ATB (Will send to [facility] refill pool) Requests RX for yeast infection, unsure of name. States "I keep one of those all the time."  Please review. Thank you.

## 2023-09-15 MED ORDER — LOSARTAN POTASSIUM 100 MG PO TABS: 100.0000 mg | ORAL_TABLET | Freq: Every day | ORAL | 0 refills | Status: DC

## 2023-09-15 NOTE — Telephone Encounter (Signed)
2nd request for losartan (COZAAR) 100 MG tablet

## 2023-09-15 NOTE — Telephone Encounter (Signed)
 Last OV 06/24/23 Requested Prescriptions  Pending Prescriptions Disp Refills   losartan  (COZAAR ) 100 MG tablet 90 tablet 1    Sig: Take 1 tablet (100 mg total) by mouth daily.     Cardiovascular:  Angiotensin Receptor Blockers Failed - 09/15/2023 10:49 AM      Failed - Cr in normal range and within 180 days    Creat  Date Value Ref Range Status  06/24/2023 1.21 (H) 0.50 - 1.05 mg/dL Final         Failed - Valid encounter within last 6 months    Recent Outpatient Visits           1 year ago Osteoporosis, unspecified osteoporosis type, unspecified pathological fracture presence   [facility] Pickard, Butler DASEN, MD   2 years ago Gross hematuria   [facility] Duanne, Butler DASEN, MD   2 years ago General medical exam   [facility] Duanne Butler DASEN, MD   3 years ago General medical exam   [facility] Duanne Butler DASEN, MD   4 years ago General medical exam   [facility] Duanne Butler DASEN, MD       Future Appointments             In 9 months Pickard, Butler DASEN, MD [facility], [facility]            Passed - K in normal range and within 180 days    Potassium  Date Value Ref Range Status  06/24/2023 4.3 3.5 - 5.3 mmol/L Final         Passed - Patient is not pregnant      Passed - Last BP in normal range    BP Readings from Last 1 Encounters:  06/24/23 116/64

## 2023-09-15 NOTE — Telephone Encounter (Signed)
 Requested medication (s) are due for refill today: no  Requested medication (s) are on the active medication list: yes  Last refill:  07/23/23 #30 11 RF  Future visit scheduled: yes  Notes to clinic:  med not assigned to a protocol= NT not delegated to refuse this med   Requested Prescriptions  Pending Prescriptions Disp Refills   sulfamethoxazole -trimethoprim  (BACTRIM  DS) 800-160 MG tablet 30 tablet 11    Sig: Take 1 tablet by mouth daily.     Off-Protocol Failed - 09/15/2023  9:14 AM      Failed - Medication not assigned to a protocol, review manually.      Failed - Valid encounter within last 12 months    Recent Outpatient Visits           1 year ago Osteoporosis, unspecified osteoporosis type, unspecified pathological fracture presence   [facility] Pickard, Butler DASEN, MD   2 years ago Gross hematuria   [facility] Duanne, Butler DASEN, MD   2 years ago General medical exam   [facility] Duanne Butler DASEN, MD   3 years ago General medical exam   [facility] Duanne Butler DASEN, MD   4 years ago General medical exam   [facility] Duanne Butler DASEN, MD       Future Appointments             In 9 months Pickard, Butler DASEN, MD [facility], [facility]

## 2023-09-28 ENCOUNTER — Telehealth: Payer: Self-pay | Admitting: Family Medicine

## 2023-09-28 NOTE — Telephone Encounter (Signed)
 Prescription Request  09/28/2023  LOV: 06/24/2023  What is the name of the medication or equipment?   hydrochlorothiazide  (HYDRODIURIL ) 25 MG tablet [[id number]]  DISCONTINUED  **90 day script requested**  Have you contacted your pharmacy to request a refill? Yes   Which pharmacy would you like this sent to?  [id number] [place], [place] - [address] [place] [address] Phone: [phone] Fax: [phone]    Patient notified that their request is being sent to the clinical staff for review and that they should receive a response within 2 business days.   Please advise pharmacist.

## 2023-09-30 ENCOUNTER — Other Ambulatory Visit: Payer: Self-pay | Admitting: Family Medicine

## 2023-09-30 DIAGNOSIS — I10 Essential (primary) hypertension: Secondary | ICD-10-CM

## 2023-10-05 ENCOUNTER — Other Ambulatory Visit: Payer: Self-pay

## 2023-10-05 DIAGNOSIS — I10 Essential (primary) hypertension: Secondary | ICD-10-CM

## 2023-10-05 MED ORDER — LOSARTAN POTASSIUM 100 MG PO TABS: 100.0000 mg | ORAL_TABLET | Freq: Every day | ORAL | 0 refills | Status: DC

## 2023-10-05 NOTE — Telephone Encounter (Signed)
Prescription Request  10/05/2023  LOV: 06/24/23  What is the name of the medication or equipment? losartan (COZAAR) 100 MG tablet [[id number]]   Have you contacted your pharmacy to request a refill? Yes   Which pharmacy would you like this sent to?  [id number] [place], [place] - [address] [facility] [place] [address] Phone: [phone] Fax: [phone]    Patient notified that their request is being sent to the clinical staff for review and that they should receive a response within 2 business days.   Please advise at [facility] [phone]

## 2023-10-05 NOTE — Telephone Encounter (Signed)
Prescription Request  10/05/2023  LOV: 06/24/23  What is the name of the medication or equipment? fexofenadine (ALLEGRA) 180 MG tablet [[id number]]   Have you contacted your pharmacy to request a refill? Yes   Which pharmacy would you like this sent to?  [id number] [place], [place] - [address] [place] [address] Phone: [phone] Fax: [phone]    Patient notified that their request is being sent to the clinical staff for review and that they should receive a response within 2 business days.   Please advise at [facility] [phone]

## 2023-10-05 NOTE — Telephone Encounter (Signed)
Labs in date.    Requested Prescriptions  Pending Prescriptions Disp Refills   losartan (COZAAR) 100 MG tablet 90 tablet 0    Sig: Take 1 tablet (100 mg total) by mouth daily.     Cardiovascular:  Angiotensin Receptor Blockers Failed - 10/05/2023  3:46 PM      Failed - Cr in normal range and within 180 days    Creat  Date Value Ref Range Status  06/24/2023 1.21 (H) 0.50 - 1.05 mg/dL Final         Failed - Valid encounter within last 6 months    Recent Outpatient Visits           1 year ago Osteoporosis, unspecified osteoporosis type, unspecified pathological fracture presence   [facility] Pickard, Priscille Heidelberg, MD   2 years ago Gross hematuria   [facility] Tanya Nones, Priscille Heidelberg, MD   2 years ago General medical exam   [facility] Donita Brooks, MD   3 years ago General medical exam   [facility] Donita Brooks, MD   4 years ago General medical exam   [facility] Donita Brooks, MD       Future Appointments             In 8 months Pickard, Priscille Heidelberg, MD [facility], [facility]            Passed - K in normal range and within 180 days    Potassium  Date Value Ref Range Status  06/24/2023 4.3 3.5 - 5.3 mmol/L Final         Passed - Patient is not pregnant      Passed - Last BP in normal range    BP Readings from Last 1 Encounters:  06/24/23 116/64

## 2023-10-08 ENCOUNTER — Other Ambulatory Visit: Payer: Self-pay

## 2023-10-08 ENCOUNTER — Telehealth: Payer: Self-pay | Admitting: Family Medicine

## 2023-10-08 DIAGNOSIS — J309 Allergic rhinitis, unspecified: Secondary | ICD-10-CM

## 2023-10-08 MED ORDER — FEXOFENADINE HCL 180 MG PO TABS
180.0000 mg | ORAL_TABLET | Freq: Every day | ORAL | 1 refills | Status: DC
Start: 2023-10-08 — End: 2024-03-25

## 2023-10-08 NOTE — Telephone Encounter (Signed)
Prescription Request  10/08/2023  LOV: 06/24/2023  What is the name of the medication or equipment?   fexofenadine (ALLEGRA) 180 MG tablet  **90 day script requested**  Have you contacted your pharmacy to request a refill? Yes   Which pharmacy would you like this sent to?  [id number] [place], [place] - [address] [facility] [place] [address] Phone: [phone] Fax: [phone]    Patient notified that their request is being sent to the clinical staff for review and that they should receive a response within 2 business days.   Please advise pharmacist.

## 2023-10-19 ENCOUNTER — Telehealth: Payer: Self-pay | Admitting: Family Medicine

## 2023-10-19 NOTE — Telephone Encounter (Signed)
Prescription Request  10/19/2023  LOV: 06/24/2023  What is the name of the medication or equipment?   hydrochlorothiazide (HYDRODIURIL) 25 MG tablet [[id number]]  DISCONTINUED  **Original script was for 90 days**  Have you contacted your pharmacy to request a refill? Yes   Which pharmacy would you like this sent to?  [id number] [place], [place] - [address] [facility] [facility] [address] [facility] [address] [place] [address] Phone: [phone] Fax: [phone]    Patient notified that their request is being sent to the clinical staff for review and that they should receive a response within 2 business days.   Please advise pharmacist

## 2023-10-20 ENCOUNTER — Other Ambulatory Visit: Payer: Self-pay

## 2023-10-20 DIAGNOSIS — I10 Essential (primary) hypertension: Secondary | ICD-10-CM

## 2023-10-20 MED ORDER — HYDROCHLOROTHIAZIDE 25 MG PO TABS: 25.0000 mg | ORAL_TABLET | Freq: Every day | ORAL | 1 refills | Status: DC

## 2023-10-26 ENCOUNTER — Other Ambulatory Visit: Payer: Self-pay

## 2023-10-26 ENCOUNTER — Telehealth: Payer: Self-pay | Admitting: Family Medicine

## 2023-10-26 DIAGNOSIS — J309 Allergic rhinitis, unspecified: Secondary | ICD-10-CM

## 2023-10-26 MED ORDER — CROMOLYN SODIUM 4 % OP SOLN
OPHTHALMIC | 2 refills | Status: DC
Start: 2023-10-26 — End: 2024-07-19

## 2023-10-26 NOTE — Telephone Encounter (Signed)
 Prescription Request  10/26/2023  LOV: 06/24/2023  What is the name of the medication or equipment?   cromolyn  (OPTICROM ) 4 % ophthalmic solution   Have you contacted your pharmacy to request a refill? Yes   Which pharmacy would you like this sent to?  [id number] [place], [place] - [address] [place] [address] Phone: [phone] Fax: [phone]    Patient notified that their request is being sent to the clinical staff for review and that they should receive a response within 2 business days.   Please advise pharmacist.

## 2023-10-27 ENCOUNTER — Other Ambulatory Visit: Payer: Self-pay | Admitting: Family Medicine

## 2023-10-27 NOTE — Telephone Encounter (Signed)
Requested medication (s) are due for refill today: Yes  Requested medication (s) are on the active medication list: Yes  Last refill:  08/17/23  Future visit scheduled: Yes, last OV 06/24/23  Notes to clinic:  Unable to refill per protocol, medication not assigned to the refill protocol.      Requested Prescriptions  Pending Prescriptions Disp Refills   fluconazole (DIFLUCAN) 150 MG tablet [Pharmacy Med Name: FLUCONAZOLE 150 MG TABLET] 1 tablet 0    Sig: Take 1 tablet (150 mg total) by mouth once for 1 dose.     Off-Protocol Failed - 10/27/2023  2:59 PM      Failed - Medication not assigned to a protocol, review manually.      Failed - Valid encounter within last 12 months    Recent Outpatient Visits           1 year ago Osteoporosis, unspecified osteoporosis type, unspecified pathological fracture presence   [facility] Pickard, Priscille Heidelberg, MD   2 years ago Gross hematuria   [facility] Tanya Nones, Priscille Heidelberg, MD   2 years ago General medical exam   [facility] Donita Brooks, MD   3 years ago General medical exam   [facility] Donita Brooks, MD   4 years ago General medical exam   [facility] Donita Brooks, MD       Future Appointments             In 8 months Pickard, Priscille Heidelberg, MD [facility], [facility]

## 2023-11-10 ENCOUNTER — Telehealth: Payer: Self-pay | Admitting: Family Medicine

## 2023-11-10 NOTE — Telephone Encounter (Signed)
 Received FL2 via eFax from [facility] (Department of Social Services) for provider's completion. Forms needed for the special assistance: in home program.  Placed form on nurse's desk.  Please fax back to them at [phone].

## 2023-12-30 ENCOUNTER — Ambulatory Visit
Admission: RE | Admit: 2023-12-30 | Discharge: 2023-12-30 | Disposition: A | Discharge status: Home / Self Care | Payer: Medicaid Other | Source: Ambulatory Visit | Attending: Family Medicine | Admitting: Family Medicine

## 2023-12-30 DIAGNOSIS — M810 Age-related osteoporosis without current pathological fracture: Secondary | ICD-10-CM

## 2024-01-05 ENCOUNTER — Encounter: Payer: Self-pay | Admitting: Family Medicine

## 2024-01-05 ENCOUNTER — Ambulatory Visit: Admitting: Family Medicine

## 2024-01-05 VITALS — BP 118/62 | HR 58 | Temp 97.6°F | Ht 62.0 in | Wt 128.4 lb

## 2024-01-05 DIAGNOSIS — M7542 Impingement syndrome of left shoulder: Secondary | ICD-10-CM | POA: Diagnosis not present

## 2024-01-05 DIAGNOSIS — E7800 Pure hypercholesterolemia, unspecified: Secondary | ICD-10-CM | POA: Diagnosis not present

## 2024-01-05 MED ORDER — TRIAMCINOLONE ACETONIDE 40 MG/ML IJ SUSP
80.0000 mg | Freq: Once | INTRAMUSCULAR | Status: AC
Start: 2024-01-05 — End: 2024-01-05
  Administered 2024-01-05: 80 mg via INTRA_ARTICULAR

## 2024-01-05 NOTE — Progress Notes (Signed)
 Subjective:    Patient ID: Heather Gillespie, female    DOB: 09-07-1961, 63 y.o.   MRN: [id number] Last fall, her cholesterol is extremely high.  She is here today to recheck her cholesterol.  She is not currently on a statin.  However she does complain of pain in her left shoulder.  She reports pain with overhead activities.  Specifically she states that it hurts to put on her shirt or comb her hair.  She denies any falls or injuries.  She does report warmth and pain in the shoulder at times.  She states that it aches and throbs.  She has a positive empty can sign.  She has a positive Hawkins sign.  She has pain with passive abduction of the shoulder greater than 90 degrees.  Past Medical History:  Diagnosis Date  . Abdominal pain   . Allergy   . GERD (gastroesophageal reflux disease)   . Glaucoma   . Hemorrhoids   . Hiatal hernia   . HTN (hypertension)   . IBS (irritable bowel syndrome)   . N&V (nausea and vomiting)   . Osteoporosis   . Ovarian cyst   . Rectal bleeding   . Rectal ulcer   . Sinus problem   . TMJ (dislocation of temporomandibular joint)   . Vitamin D  deficiency    Past Surgical History:  Procedure Laterality Date  . ANAL FISSURE REPAIR    . BACK SURGERY    . CHOLECYSTECTOMY  1984  . COLONOSCOPY W/ POLYPECTOMY     colon ulcers  . EYE SURGERY    . FOOT SURGERY    . HEMI-MICRODISCECTOMY LUMBAR LAMINECTOMY LEVEL 1 Left 05/26/2013   Procedure: HEMI-MICRODISCECTOMY LUMBAR LAMINECTOMY L4 - L5 ON THE LEFT LEVEL 1;  Surgeon: Florencia Hunter, MD;  Location: [facility];  Service: Orthopedics;  Laterality: Left;  . HEMORROIDECTOMY     Current Outpatient Medications on File Prior to Visit  Medication Sig Dispense Refill  . alendronate  (FOSAMAX ) 70 MG tablet TAKE 1 TABLET BY MOUTH EVERY 7 DAYS. TAKE WITH A FULL GLASS OF WATER ON AN EMPTY STOMACH. 4 tablet 11  . cromolyn  (OPTICROM ) 4 % ophthalmic solution INSTILL 1 DROP INTO BOTH EYES 4 TIMES A DAY. Strength: 4 % 10 mL 2  .  dexlansoprazole (DEXILANT) 60 MG capsule Take 60 mg by mouth daily.    . Ergocalciferol  (VITAMIN D2) 2000 units TABS Take 1 tablet by mouth daily.    . fexofenadine  (ALLEGRA ) 180 MG tablet Take 1 tablet (180 mg total) by mouth daily. 90 tablet 1  . fluticasone  (FLONASE ) 50 MCG/ACT nasal spray Place 2 sprays into both nostrils daily. 48 mL 3  . gabapentin  (NEURONTIN ) 300 MG capsule TAKE 1 CAPSULE BY MOUTH THREE TIMES A DAY 90 capsule 5  . hydrochlorothiazide  (HYDRODIURIL ) 25 MG tablet Take 1 tablet (25 mg total) by mouth daily. 90 tablet 1  . losartan  (COZAAR ) 100 MG tablet Take 1 tablet (100 mg total) by mouth daily. 90 tablet 0  . Probiotic, Lactobacillus, CAPS Take 1 capsule by mouth daily. 30 capsule 5  . sulfamethoxazole -trimethoprim  (BACTRIM  DS) 800-160 MG tablet Take 1 tablet by mouth daily. 30 tablet 11  . ondansetron  (ZOFRAN ) 4 MG tablet Take 1 tablet (4 mg total) by mouth every 6 (six) hours. (Patient not taking: Reported on 01/05/2024) 12 tablet 0  . promethazine  (PHENERGAN ) 25 MG tablet Take 1 tablet (25 mg total) by mouth every 6 (six) hours as needed for nausea or  vomiting. (Patient not taking: Reported on 01/05/2024) 10 tablet 0   No current facility-administered medications on file prior to visit.   Allergies  Allergen Reactions  . Hydroxyzine Hives    Other reaction(s): Unknown  . Lotronex [Alosetron Hcl]     Other reaction(s): Unknown  . Other Other (See Comments)  . Hydroxyzine Pamoate Rash   Social History   Socioeconomic History  . Marital status: Single    Spouse name: Not on file  . Number of children: Not on file  . Years of education: Not on file  . Highest education level: Not on file  Occupational History  . Not on file  Tobacco Use  . Smoking status: Never  . Smokeless tobacco: Never  Vaping Use  . Vaping status: Never Used  Substance and Sexual Activity  . Alcohol use: No  . Drug use: No  . Sexual activity: Not Currently    Birth control/protection:  Post-menopausal  Other Topics Concern  . Not on file  Social History Narrative  . Not on file   Social Drivers of Health   Financial Resource Strain: Low Risk  (08/14/2022)   Overall Financial Resource Strain (CARDIA)   . Difficulty of Paying Living Expenses: Not very hard  Food Insecurity: No Food Insecurity (08/14/2022)   Hunger Vital Sign   . Worried About Programme researcher, broadcasting/film/video in the Last Year: Never true   . Ran Out of Food in the Last Year: Never true  Transportation Needs: No Transportation Needs (08/14/2022)   PRAPARE - Transportation   . Lack of Transportation (Medical): No   . Lack of Transportation (Non-Medical): No  Physical Activity: Inactive (08/14/2022)   Exercise Vital Sign   . Days of Exercise per Week: 0 days   . Minutes of Exercise per Session: 0 min  Stress: No Stress Concern Present (08/14/2022)   [employer] of Occupational Health - Occupational Stress Questionnaire   . Feeling of Stress : Not at all  Social Connections: Socially Isolated (08/14/2022)   Social Connection and Isolation Panel [NHANES]   . Frequency of Communication with Friends and Family: More than three times a week   . Frequency of Social Gatherings with Friends and Family: Twice a week   . Attends Religious Services: Never   . Active Member of Clubs or Organizations: No   . Attends Banker Meetings: Never   . Marital Status: Never married  Intimate Partner Violence: Not At Risk (08/14/2022)   Humiliation, Afraid, Rape, and Kick questionnaire   . Fear of Current or Ex-Partner: No   . Emotionally Abused: No   . Physically Abused: No   . Sexually Abused: No     Review of Systems  All other systems reviewed and are negative.      Objective:   Physical Exam Vitals reviewed.  Constitutional:      General: She is not in acute distress.    Appearance: Normal appearance. She is normal weight. She is not ill-appearing, toxic-appearing or diaphoretic.  HENT:      Nose: Nose normal. No congestion or rhinorrhea.     Mouth/Throat:     Mouth: Mucous membranes are moist.     Pharynx: Oropharynx is clear. No oropharyngeal exudate or posterior oropharyngeal erythema.  Eyes:     Conjunctiva/sclera: Conjunctivae normal.     Pupils: Pupils are equal, round, and reactive to light.  Cardiovascular:     Rate and Rhythm: Normal rate and regular rhythm.  Heart sounds: Normal heart sounds. No murmur heard.    No friction rub. No gallop.  Pulmonary:     Effort: Pulmonary effort is normal. No respiratory distress.     Breath sounds: Normal breath sounds. No stridor. No wheezing, rhonchi or rales.  Musculoskeletal:     Left shoulder: Tenderness and crepitus present. No swelling, deformity, effusion or bony tenderness. Decreased range of motion.     Right lower leg: No edema.     Left lower leg: No edema.  Skin:    Findings: No lesion or rash.  Neurological:     General: No focal deficit present.     Mental Status: She is alert and oriented to person, place, and time. Mental status is at baseline.     Cranial Nerves: No cranial nerve deficit.     Sensory: No sensory deficit.     Motor: No weakness.     Coordination: Coordination normal.  Psychiatric:        Mood and Affect: Mood normal.        Behavior: Behavior normal.        Thought Content: Thought content normal.        Judgment: Judgment normal.          Assessment & Plan:  Pure hypercholesterolemia - Plan: CBC with Differential/Platelet, COMPLETE METABOLIC PANEL WITHOUT GFR, Lipid panel  Impingement syndrome of left shoulder Check CBC CMP and lipid panel today.  Ideally I like to see her LDL cholesterol less than 562.  If not I would recommend a statin.  Blood pressure is excellent.  I believe she has impingement syndrome in her left shoulder.  Using sterile technique, I injected the left subacromial space with 2 cc of lidocaine , 2 cc of Marcaine , and 2 cc of 40 mg/mL Kenalog .  The patient  tolerated the procedure well without complication.  If not improving, I would recommend imaging of the shoulder to rule out a tear in the rotator cuff.

## 2024-01-05 NOTE — Addendum Note (Signed)
 Addended by: Verneda Golder on: 01/05/2024 09:22 AM   Modules accepted: Orders

## 2024-01-06 LAB — CBC WITH DIFFERENTIAL/PLATELET
Absolute Lymphocytes: 1165 {cells}/uL (ref 850–3900)
Absolute Monocytes: 211 {cells}/uL (ref 200–950)
Basophils Absolute: 20 {cells}/uL (ref 0–200)
Basophils Relative: 0.6 %
Eosinophils Absolute: 50 {cells}/uL (ref 15–500)
Eosinophils Relative: 1.5 %
HCT: 35.8 % (ref 35.0–45.0)
Hemoglobin: 11.7 g/dL (ref 11.7–15.5)
MCH: 30.6 pg (ref 27.0–33.0)
MCHC: 32.7 g/dL (ref 32.0–36.0)
MCV: 93.7 fL (ref 80.0–100.0)
MPV: 10.8 fL (ref 7.5–12.5)
Monocytes Relative: 6.4 %
Neutro Abs: 1855 {cells}/uL (ref 1500–7800)
Neutrophils Relative %: 56.2 %
Platelets: 174 10*3/uL (ref 140–400)
RBC: 3.82 10*6/uL (ref 3.80–5.10)
RDW: 12 % (ref 11.0–15.0)
Total Lymphocyte: 35.3 %
WBC: 3.3 10*3/uL — ABNORMAL LOW (ref 3.8–10.8)

## 2024-01-06 LAB — COMPLETE METABOLIC PANEL WITHOUT GFR
AG Ratio: 1.6 (calc) (ref 1.0–2.5)
ALT: 6 U/L (ref 6–29)
AST: 19 U/L (ref 10–35)
Albumin: 4.5 g/dL (ref 3.6–5.1)
Alkaline phosphatase (APISO): 56 U/L (ref 37–153)
BUN: 18 mg/dL (ref 7–25)
CO2: 21 mmol/L (ref 20–32)
Calcium: 9.7 mg/dL (ref 8.6–10.4)
Chloride: 105 mmol/L (ref 98–110)
Creat: 1.05 mg/dL (ref 0.50–1.05)
Globulin: 2.8 g/dL (ref 1.9–3.7)
Glucose, Bld: 84 mg/dL (ref 65–99)
Potassium: 4.6 mmol/L (ref 3.5–5.3)
Sodium: 137 mmol/L (ref 135–146)
Total Bilirubin: 0.7 mg/dL (ref 0.2–1.2)
Total Protein: 7.3 g/dL (ref 6.1–8.1)

## 2024-01-06 LAB — LIPID PANEL
Cholesterol: 235 mg/dL — ABNORMAL HIGH (ref <200)
HDL: 84 mg/dL (ref >=50)
LDL Cholesterol (Calc): 134 mg/dL — ABNORMAL HIGH
Non-HDL Cholesterol (Calc): 151 mg/dL — ABNORMAL HIGH (ref <130)
Total CHOL/HDL Ratio: 2.8 (calc) (ref <5.0)
Triglycerides: 80 mg/dL (ref <150)

## 2024-01-20 ENCOUNTER — Other Ambulatory Visit: Payer: Self-pay | Admitting: Family Medicine

## 2024-01-20 DIAGNOSIS — I10 Essential (primary) hypertension: Secondary | ICD-10-CM

## 2024-02-22 ENCOUNTER — Telehealth: Payer: Self-pay

## 2024-02-22 NOTE — Telephone Encounter (Signed)
 Prescription Request  02/22/2024  LOV: 01/05/24  What is the name of the medication or equipment? fluconazole  (DIFLUCAN ) 150 MG tablet [[id number]]  ENDED   Have you contacted your pharmacy to request a refill? Yes   Which pharmacy would you like this sent to?  [id number] [place], [place] - [address] [facility] [address] [place] [address] Phone: [phone] Fax: [phone]    Patient notified that their request is being sent to the clinical staff for review and that they should receive a response within 2 business days.   Please advise at [facility] [phone]

## 2024-02-23 ENCOUNTER — Other Ambulatory Visit: Payer: Self-pay | Admitting: Family Medicine

## 2024-02-23 MED ORDER — FLUCONAZOLE 150 MG PO TABS: 150.0000 mg | ORAL_TABLET | Freq: Once | ORAL | 0 refills | Status: AC

## 2024-02-25 ENCOUNTER — Other Ambulatory Visit: Payer: Self-pay

## 2024-02-25 NOTE — Telephone Encounter (Signed)
 Prescription Request  02/25/2024  LOV: 01/05/24  What is the name of the medication or equipment? gabapentin  (NEURONTIN ) 300 MG capsule [[id number]]   Have you contacted your pharmacy to request a refill? Yes   Which pharmacy would you like this sent to?  [id number] [place], [place] - [address] [facility] [address] [facility] [address] [place] [address] Phone: [phone] Fax: [phone]    Patient notified that their request is being sent to the clinical staff for review and that they should receive a response within 2 business days.   Please advise at [facility] [phone]

## 2024-02-26 MED ORDER — GABAPENTIN 300 MG PO CAPS: ORAL_CAPSULE | ORAL | 5 refills | Status: DC

## 2024-02-26 NOTE — Telephone Encounter (Signed)
 Requested Prescriptions  Pending Prescriptions Disp Refills   gabapentin  (NEURONTIN ) 300 MG capsule 90 capsule 5    Sig: TAKE 1 CAPSULE BY MOUTH THREE TIMES A DAY     Neurology: Anticonvulsants - gabapentin  Passed - 02/26/2024  5:52 PM      Passed - Cr in normal range and within 360 days    Creat  Date Value Ref Range Status  01/05/2024 1.05 0.50 - 1.05 mg/dL Final         Passed - Completed PHQ-2 or PHQ-9 in the last 360 days      Passed - Valid encounter within last 12 months    Recent Outpatient Visits           1 month ago Pure hypercholesterolemia   [facility] Austine Lefort, MD   8 months ago Benign essential HTN   [facility] Pickard, Cisco Crest, MD   1 year ago Gross hematuria   [facility] Austine Lefort, MD   1 year ago Gross hematuria   [facility] Austine Lefort, MD   1 year ago Gross hematuria   [facility] Pickard, Cisco Crest, MD       Future Appointments             In 3 months Pickard, Cisco Crest, MD [facility], [facility]

## 2024-03-23 ENCOUNTER — Other Ambulatory Visit: Payer: Self-pay | Admitting: Family Medicine

## 2024-03-24 ENCOUNTER — Telehealth: Payer: Self-pay | Admitting: Family Medicine

## 2024-03-24 NOTE — Telephone Encounter (Signed)
 Prescription Request  03/24/2024  LOV: 01/05/2024  What is the name of the medication or equipment?   **90 day scripts requested**  fexofenadine  (ALLEGRA ) 180 MG tablet   losartan  (COZAAR ) 100 MG tablet [[id number]]   Have you contacted your pharmacy to request a refill? Yes   Which pharmacy would you like this sent to?  [id number] [address], [place] - [address] [address] [facility] [address] [facility] [address] [place] [address] Phone: [phone] Fax: [phone]    Patient notified that their request is being sent to the clinical staff for review and that they should receive a response within 2 business days.   Please advise pharmacist.

## 2024-03-25 ENCOUNTER — Other Ambulatory Visit: Payer: Self-pay

## 2024-03-25 DIAGNOSIS — I10 Essential (primary) hypertension: Secondary | ICD-10-CM

## 2024-03-25 DIAGNOSIS — J309 Allergic rhinitis, unspecified: Secondary | ICD-10-CM

## 2024-03-25 MED ORDER — FEXOFENADINE HCL 180 MG PO TABS: 180.0000 mg | ORAL_TABLET | Freq: Every day | ORAL | 1 refills | Status: DC

## 2024-03-25 MED ORDER — LOSARTAN POTASSIUM 100 MG PO TABS
100.0000 mg | ORAL_TABLET | Freq: Every day | ORAL | 0 refills | Status: DC
Start: 2024-03-25 — End: 2024-06-20

## 2024-03-25 NOTE — Telephone Encounter (Signed)
 Sent in medications

## 2024-03-29 ENCOUNTER — Telehealth: Payer: Self-pay | Admitting: Family Medicine

## 2024-03-29 ENCOUNTER — Other Ambulatory Visit: Payer: Self-pay

## 2024-03-29 DIAGNOSIS — M810 Age-related osteoporosis without current pathological fracture: Secondary | ICD-10-CM

## 2024-03-29 MED ORDER — ALENDRONATE SODIUM 70 MG PO TABS
ORAL_TABLET | ORAL | 11 refills | Status: AC
Start: 2024-03-29 — End: ?

## 2024-03-29 NOTE — Telephone Encounter (Signed)
 Prescription Request  03/29/2024  LOV: 01/05/2024  What is the name of the medication or equipment?   alendronate  (FOSAMAX ) 70 MG tablet   Have you contacted your pharmacy to request a refill? Yes   Which pharmacy would you like this sent to?  [id number] [address] [place], [place] - [address] [facility] [address] [place] [address] Phone: [phone] Fax: [phone]    Patient notified that their request is being sent to the clinical staff for review and that they should receive a response within 2 business days.   Please advise pharmacist.

## 2024-04-10 ENCOUNTER — Other Ambulatory Visit: Payer: Self-pay | Admitting: Family Medicine

## 2024-04-11 NOTE — Telephone Encounter (Signed)
 Allergic reaction contradiction notice. Please advise.

## 2024-04-28 ENCOUNTER — Encounter: Payer: Self-pay | Admitting: Gastroenterology

## 2024-04-28 ENCOUNTER — Other Ambulatory Visit: Payer: Self-pay | Admitting: Family Medicine

## 2024-05-02 ENCOUNTER — Encounter: Payer: Self-pay | Admitting: *Deleted

## 2024-05-02 NOTE — Telephone Encounter (Signed)
 Requested by interface surescripts. Medication discontinued 06/25/23 due to allergic reactions.  Requested Prescriptions  Refused Prescriptions Disp Refills   rosuvastatin  (CRESTOR ) 10 MG tablet [Pharmacy Med Name: ROSUVASTATIN  CALCIUM  10 MG TAB] 90 tablet 3    Sig: TAKE 1 TABLET BY MOUTH EVERY DAY     Cardiovascular:  Antilipid - Statins 2 Failed - 05/02/2024  2:23 PM      Failed - Lipid Panel in normal range within the last 12 months    Cholesterol  Date Value Ref Range Status  01/05/2024 235 (H) <200 mg/dL Final   LDL Cholesterol (Calc)  Date Value Ref Range Status  01/05/2024 134 (H) mg/dL (calc) Final    Comment:    Reference range: <100 . Desirable range <100 mg/dL for primary prevention;   <70 mg/dL for patients with CHD or diabetic patients  with > or = 2 CHD risk factors. SABRA LDL-C is now calculated using the Martin-Hopkins  calculation, which is a validated novel method providing  better accuracy than the Friedewald equation in the  estimation of LDL-C.  Gladis APPLETHWAITE et al. SANDREA. 7986;689(80): 2061-2068  (http://education.[employer].com/faq/FAQ164)    HDL  Date Value Ref Range Status  01/05/2024 84 > OR = 50 mg/dL Final   Triglycerides  Date Value Ref Range Status  01/05/2024 80 <150 mg/dL Final         Passed - Cr in normal range and within 360 days    Creat  Date Value Ref Range Status  01/05/2024 1.05 0.50 - 1.05 mg/dL Final         Passed - Patient is not pregnant      Passed - Valid encounter within last 12 months    Recent Outpatient Visits           3 months ago Pure hypercholesterolemia   [place] [facility] Duanne Butler DASEN, MD   10 months ago Benign essential HTN   [place] [facility] Pickard, Butler DASEN, MD   1 year ago Gross hematuria   [place] [facility] Duanne Butler DASEN, MD   1 year ago Gross hematuria   [place] [facility] Duanne Butler DASEN, MD   2  years ago Gross hematuria   [place] [facility] Pickard, Butler DASEN, MD       Future Appointments             In 1 month Pickard, Butler DASEN, MD [facility], [facility]

## 2024-05-05 ENCOUNTER — Other Ambulatory Visit: Payer: Self-pay | Admitting: Family Medicine

## 2024-05-06 NOTE — Telephone Encounter (Signed)
 Requested medication (s) are due for refill today: no  Requested medication (s) are on the active medication list: no  Last refill:  02/23/24  Future visit scheduled: no  Notes to clinic:  Unable to refill per protocol, cannot delegate. Routing for approval      Requested Prescriptions  Pending Prescriptions Disp Refills   fluconazole  (DIFLUCAN ) 150 MG tablet [Pharmacy Med Name: FLUCONAZOLE  150 MG TABLET] 1 tablet 0    Sig: Take 1 tablet (150 mg total) by mouth once for 1 dose.     Off-Protocol Failed - 05/06/2024  8:20 AM      Failed - Medication not assigned to a protocol, review manually.      Passed - Valid encounter within last 12 months    Recent Outpatient Visits           4 months ago Pure hypercholesterolemia   [place] [facility] Duanne, Butler DASEN, MD   10 months ago Benign essential HTN   [place] [facility] Pickard, Butler DASEN, MD   1 year ago Gross hematuria   [facility] Duanne Butler DASEN, MD   1 year ago Gross hematuria   [place] [facility] Duanne Butler DASEN, MD   2 years ago Gross hematuria   [place] [facility] Pickard, Butler DASEN, MD       Future Appointments             In 1 month Pickard, Butler DASEN, MD [facility], [facility]

## 2024-05-10 ENCOUNTER — Encounter: Payer: Self-pay | Admitting: Podiatry

## 2024-05-10 ENCOUNTER — Ambulatory Visit: Admitting: Podiatry

## 2024-05-10 VITALS — Ht 62.0 in | Wt 128.0 lb

## 2024-05-10 DIAGNOSIS — M7672 Peroneal tendinitis, left leg: Secondary | ICD-10-CM

## 2024-05-10 MED ORDER — TRIAMCINOLONE ACETONIDE 10 MG/ML IJ SUSP
10.0000 mg | Freq: Once | INTRAMUSCULAR | Status: AC
Start: 2024-05-10 — End: 2024-05-10
  Administered 2024-05-10: 10 mg

## 2024-05-10 NOTE — Progress Notes (Signed)
 Patient presents complaining of pain in the base of the fifth metatarsal left.  She has had problems with it in the past.  Is gone about a year without an injection.  Does not recall any thing that triggered at this time.  Has not noticed any redness or ecchymosis.   Physical exam:  General appearance: Pleasant, and in no acute distress. AOx3.  Vascular: Pedal pulses: DP 2/4 bilaterally, PT 2/4 bilaterally.  Mild edema lower legs bilaterally. Capillary fill time immediate bilaterally.  Neurological: Grossly intact bilaterally  Dermatologic:   Skin normal temperature bilaterally.  Skin normal color, tone, and texture bilaterally.   Musculoskeletal: Tenderness at peroneus brevis at the fifth metatarsal base left..  Some tenderness at the fifth TMT left.    Diagnosis: 1.  Peroneal tendinitis left  Plan: -Recommend icing area 20 minutes an hour several times a day. -Wear good stable supportive shoes. -injected 3cc 2:1 mixture 0.5 cc Marcaine :Kenolog 10mg /37ml along peroneus brevis at insertion left.   Return as needed

## 2024-05-17 ENCOUNTER — Other Ambulatory Visit: Payer: Self-pay | Admitting: Family Medicine

## 2024-05-19 ENCOUNTER — Other Ambulatory Visit: Payer: Self-pay | Admitting: Family Medicine

## 2024-05-19 NOTE — Telephone Encounter (Signed)
 Requested Prescriptions  Refused Prescriptions Disp Refills   sulfamethoxazole -trimethoprim  (BACTRIM  DS) 800-160 MG tablet [Pharmacy Med Name: SULFAMETHOXAZOLE -TMP DS TABLET] 30 tablet 11    Sig: TAKE 1 TABLET BY MOUTH EVERY DAY     Off-Protocol Failed - 05/19/2024  2:19 PM      Failed - Medication not assigned to a protocol, review manually.      Passed - Valid encounter within last 12 months    Recent Outpatient Visits           4 months ago Pure hypercholesterolemia   [place] [facility] Duanne, Butler DASEN, MD   11 months ago Benign essential HTN   [place] [facility] Pickard, Butler DASEN, MD   1 year ago Gross hematuria   [place] [facility] Duanne Butler DASEN, MD   1 year ago Gross hematuria   [place] [facility] Duanne Butler DASEN, MD   2 years ago Gross hematuria   [place] [facility] Pickard, Butler DASEN, MD       Future Appointments             In 1 month Pickard, Butler DASEN, MD [facility], [facility]

## 2024-05-25 ENCOUNTER — Other Ambulatory Visit: Payer: Self-pay | Admitting: Family Medicine

## 2024-05-25 ENCOUNTER — Ambulatory Visit (INDEPENDENT_AMBULATORY_CARE_PROVIDER_SITE_OTHER): Payer: PRIVATE HEALTH INSURANCE | Admitting: Podiatry

## 2024-05-25 DIAGNOSIS — M7672 Peroneal tendinitis, left leg: Secondary | ICD-10-CM | POA: Diagnosis not present

## 2024-05-25 MED ORDER — OXYCODONE-ACETAMINOPHEN 5-325 MG PO TABS: 1.0000 | ORAL_TABLET | ORAL | 0 refills | Status: AC | PRN

## 2024-05-25 NOTE — Progress Notes (Signed)
 Subjective:  Patient ID: Heather Gillespie, female    DOB: 01-Jul-1961,  MRN: [id number]  Chief Complaint  Patient presents with   Peroneal tendinitis of left lower extremity    Pt stated that she saw Dr Christine on 8/26 he have her a shot in the side of her foot and now her foot is bruised and warm to the touch     63 y.o. female presents with the above complaint.  Patient presents with complaint of left lateral foot pain that has been on for quite some time came out of nowhere hurts with ambulation is with pressure causing a lot of bruising.  She saw Dr. Gabriel who gave her an injection.  She wanted to discuss further treatment options denies any other acute complaints pain scale 7 out of 10 dull aching nature.   Review of Systems: Negative except as noted in the HPI. Denies N/V/F/Ch.  Past Medical History:  Diagnosis Date   Abdominal pain    Allergy    GERD (gastroesophageal reflux disease)    Glaucoma    Hemorrhoids    Hiatal hernia    HTN (hypertension)    IBS (irritable bowel syndrome)    N&V (nausea and vomiting)    Osteoporosis    Ovarian cyst    Rectal bleeding    Rectal ulcer    Sinus problem    TMJ (dislocation of temporomandibular joint)    Vitamin D  deficiency     Current Outpatient Medications:    oxyCODONE -acetaminophen  (PERCOCET) 5-325 MG tablet, Take 1 tablet by mouth every 4 (four) hours as needed for severe pain (pain score 7-10)., Disp: 30 tablet, Rfl: 0   alendronate  (FOSAMAX ) 70 MG tablet, TAKE 1 TABLET BY MOUTH EVERY 7 DAYS. TAKE WITH A FULL GLASS OF WATER ON AN EMPTY STOMACH., Disp: 4 tablet, Rfl: 11   cromolyn  (OPTICROM ) 4 % ophthalmic solution, INSTILL 1 DROP INTO BOTH EYES 4 TIMES A DAY. Strength: 4 %, Disp: 10 mL, Rfl: 2   dexlansoprazole (DEXILANT) 60 MG capsule, Take 60 mg by mouth daily., Disp: , Rfl:    Ergocalciferol  (VITAMIN D2) 2000 units TABS, Take 1 tablet by mouth daily., Disp: , Rfl:    fexofenadine  (ALLEGRA ) 180 MG tablet, Take 1 tablet  (180 mg total) by mouth daily., Disp: 90 tablet, Rfl: 1   fluticasone  (FLONASE ) 50 MCG/ACT nasal spray, Place 2 sprays into both nostrils daily., Disp: 48 mL, Rfl: 3   gabapentin  (NEURONTIN ) 300 MG capsule, TAKE 1 CAPSULE BY MOUTH THREE TIMES A DAY, Disp: 90 capsule, Rfl: 5   hydrochlorothiazide  (HYDRODIURIL ) 25 MG tablet, TAKE 1 TABLET (25 MG TOTAL) BY MOUTH DAILY., Disp: 90 tablet, Rfl: 1   losartan  (COZAAR ) 100 MG tablet, Take 1 tablet (100 mg total) by mouth daily., Disp: 90 tablet, Rfl: 0   ondansetron  (ZOFRAN ) 4 MG tablet, Take 1 tablet (4 mg total) by mouth every 6 (six) hours., Disp: 12 tablet, Rfl: 0   Probiotic, Lactobacillus, CAPS, Take 1 capsule by mouth daily., Disp: 30 capsule, Rfl: 5   promethazine  (PHENERGAN ) 25 MG tablet, Take 1 tablet (25 mg total) by mouth every 6 (six) hours as needed for nausea or vomiting., Disp: 10 tablet, Rfl: 0   sulfamethoxazole -trimethoprim  (BACTRIM  DS) 800-160 MG tablet, Take 1 tablet by mouth daily., Disp: 30 tablet, Rfl: 11  Social History   Tobacco Use  Smoking Status Never  Smokeless Tobacco Never    Allergies  Allergen Reactions   Rosuvastatin  Calcium  Anaphylaxis  Hydroxyzine Hives    Other reaction(s): Unknown   Lotronex [Alosetron Hcl]     Other reaction(s): Unknown   Other Other (See Comments)   Hydroxyzine Pamoate Rash   Objective:  There were no vitals filed for this visit. There is no height or weight on file to calculate BMI. Constitutional Well developed. Well nourished.  Vascular Dorsalis pedis pulses palpable bilaterally. Posterior tibial pulses palpable bilaterally. Capillary refill normal to all digits.  No cyanosis or clubbing noted. Pedal hair growth normal.  Neurologic Normal speech. Oriented to person, place, and time. Epicritic sensation to light touch grossly present bilaterally.  Dermatologic Nails well groomed and normal in appearance. No open wounds. No skin lesions.  Orthopedic: Pain on palpation along  the course of the peroneal tendon including the insertion.  Weakness noted of the peroneal tendon on resisted dorsiflexion eversion of the foot.  Manual muscle strength 2 out of 5.  May likely be due to pain.  No open wounds or lesion noted.   Radiographs: None Assessment:   1. Peroneal tendinitis of left lower extremity    Plan:  Patient was evaluated and treated and all questions answered.  Left peroneal tendinitis - All questions and concerns were discussed with the patient in extensive detail I discussed with the patient that she would benefit from cam boot immobilization to allow the soft tissue structure to heal however she refused a cam boot she would like to do ankle brace at this time ankle brace was dispensed. - If there is no improvement we will discuss cam boot immobilization during next visit  No follow-ups on file.

## 2024-06-03 ENCOUNTER — Other Ambulatory Visit: Payer: Self-pay | Admitting: Family Medicine

## 2024-06-15 ENCOUNTER — Other Ambulatory Visit: Payer: Self-pay | Admitting: Family Medicine

## 2024-06-15 DIAGNOSIS — Z1231 Encounter for screening mammogram for malignant neoplasm of breast: Secondary | ICD-10-CM

## 2024-06-19 ENCOUNTER — Other Ambulatory Visit: Payer: Self-pay | Admitting: Family Medicine

## 2024-06-20 ENCOUNTER — Other Ambulatory Visit: Payer: Self-pay

## 2024-06-20 ENCOUNTER — Telehealth: Payer: Self-pay

## 2024-06-20 DIAGNOSIS — I10 Essential (primary) hypertension: Secondary | ICD-10-CM

## 2024-06-20 MED ORDER — LOSARTAN POTASSIUM 100 MG PO TABS: 100.0000 mg | ORAL_TABLET | Freq: Every day | ORAL | 1 refills | Status: AC

## 2024-06-20 NOTE — Telephone Encounter (Signed)
 Prescription Request  06/20/2024  LOV: 01/05/24  What is the name of the medication or equipment? losartan  (COZAAR ) 100 MG tablet [[id number]]   Have you contacted your pharmacy to request a refill? Yes   Which pharmacy would you like this sent to?  [id number] [address], [place] - [address] [place] [address] Phone: [phone] Fax: [phone]    Patient notified that their request is being sent to the clinical staff for review and that they should receive a response within 2 business days.   Please advise at [facility] [phone]

## 2024-06-21 ENCOUNTER — Other Ambulatory Visit: Payer: Self-pay | Admitting: Family Medicine

## 2024-06-22 ENCOUNTER — Ambulatory Visit: Payer: PRIVATE HEALTH INSURANCE | Admitting: Podiatry

## 2024-06-22 DIAGNOSIS — M7672 Peroneal tendinitis, left leg: Secondary | ICD-10-CM | POA: Diagnosis not present

## 2024-06-22 DIAGNOSIS — M216X2 Other acquired deformities of left foot: Secondary | ICD-10-CM | POA: Diagnosis not present

## 2024-06-22 DIAGNOSIS — M216X1 Other acquired deformities of right foot: Secondary | ICD-10-CM | POA: Diagnosis not present

## 2024-06-22 NOTE — Progress Notes (Signed)
 Subjective:  Patient ID: Heather Gillespie, female    DOB: 1961-04-30,  MRN: [id number]  Chief Complaint  Patient presents with   Foot Pain    Pt stated that her right big toe is bothering her     62 y.o. female presents with the above complaint.  Patient presents for follow-up of left peroneal tendinitis pain.  She states that is doing a little bit better.  She would like to discuss next treatment plan denies any other acute issues  Review of Systems: Negative except as noted in the HPI. Denies N/V/F/Ch.  Past Medical History:  Diagnosis Date   Abdominal pain    Allergy    GERD (gastroesophageal reflux disease)    Glaucoma    Hemorrhoids    Hiatal hernia    HTN (hypertension)    IBS (irritable bowel syndrome)    N&V (nausea and vomiting)    Osteoporosis    Ovarian cyst    Rectal bleeding    Rectal ulcer    Sinus problem    TMJ (dislocation of temporomandibular joint)    Vitamin D  deficiency     Current Outpatient Medications:    alendronate  (FOSAMAX ) 70 MG tablet, TAKE 1 TABLET BY MOUTH EVERY 7 DAYS. TAKE WITH A FULL GLASS OF WATER ON AN EMPTY STOMACH., Disp: 4 tablet, Rfl: 11   ciprofloxacin  (CIPRO ) 500 MG tablet, Take 1 tablet (500 mg total) by mouth 2 (two) times daily for 5 days., Disp: 10 tablet, Rfl: 0   cromolyn  (OPTICROM ) 4 % ophthalmic solution, INSTILL 1 DROP INTO BOTH EYES 4 TIMES A DAY. Strength: 4 %, Disp: 10 mL, Rfl: 2   dexlansoprazole (DEXILANT) 60 MG capsule, Take 60 mg by mouth daily., Disp: , Rfl:    Ergocalciferol  (VITAMIN D2) 2000 units TABS, Take 1 tablet by mouth daily., Disp: , Rfl:    fexofenadine  (ALLEGRA ) 180 MG tablet, Take 1 tablet (180 mg total) by mouth daily., Disp: 90 tablet, Rfl: 1   fluticasone  (FLONASE ) 50 MCG/ACT nasal spray, Place 2 sprays into both nostrils daily., Disp: 48 mL, Rfl: 3   gabapentin  (NEURONTIN ) 300 MG capsule, TAKE 1 CAPSULE BY MOUTH THREE TIMES A DAY, Disp: 90 capsule, Rfl: 5   hydrochlorothiazide  (HYDRODIURIL ) 25 MG  tablet, TAKE 1 TABLET (25 MG TOTAL) BY MOUTH DAILY., Disp: 90 tablet, Rfl: 1   losartan  (COZAAR ) 100 MG tablet, Take 1 tablet (100 mg total) by mouth daily., Disp: 90 tablet, Rfl: 1   ondansetron  (ZOFRAN ) 4 MG tablet, Take 1 tablet (4 mg total) by mouth every 6 (six) hours., Disp: 12 tablet, Rfl: 0   oxyCODONE -acetaminophen  (PERCOCET) 5-325 MG tablet, Take 1 tablet by mouth every 4 (four) hours as needed for severe pain (pain score 7-10)., Disp: 30 tablet, Rfl: 0   Probiotic, Lactobacillus, CAPS, Take 1 capsule by mouth daily., Disp: 30 capsule, Rfl: 5   promethazine  (PHENERGAN ) 25 MG tablet, Take 1 tablet (25 mg total) by mouth every 6 (six) hours as needed for nausea or vomiting., Disp: 10 tablet, Rfl: 0   sulfamethoxazole -trimethoprim  (BACTRIM  DS) 800-160 MG tablet, TAKE 1 TABLET BY MOUTH EVERY DAY, Disp: 30 tablet, Rfl: 11  Social History   Tobacco Use  Smoking Status Never  Smokeless Tobacco Never    Allergies  Allergen Reactions   Rosuvastatin  Calcium  Anaphylaxis   Hydroxyzine Hives    Other reaction(s): Unknown   Lotronex [Alosetron Hcl]     Other reaction(s): Unknown   Other Other (See Comments)   Hydroxyzine  Pamoate Rash   Objective:  There were no vitals filed for this visit. There is no height or weight on file to calculate BMI. Constitutional Well developed. Well nourished.  Vascular Dorsalis pedis pulses palpable bilaterally. Posterior tibial pulses palpable bilaterally. Capillary refill normal to all digits.  No cyanosis or clubbing noted. Pedal hair growth normal.  Neurologic Normal speech. Oriented to person, place, and time. Epicritic sensation to light touch grossly present bilaterally.  Dermatologic Nails well groomed and normal in appearance. No open wounds. No skin lesions.  Orthopedic: Pain on palpation along the course of the peroneal tendon including the insertion.  Weakness noted of the peroneal tendon on resisted dorsiflexion eversion of the foot.   Manual muscle strength 2 out of 5.  May likely be due to pain.  No open wounds or lesion noted.   Radiographs: None Assessment:   1. Peroneal tendinitis, left   2. Other acquired deformities of left foot   3. Other acquired deformities of right foot     Plan:  Patient was evaluated and treated and all questions answered.  Left peroneal tendinitis - All questions and concerns were discussed with the patient in extensive detail.  Given the patient has some improvement she would benefit from a steroid injection to help decrease inflammatory component specially pain.  She agrees with plan like to proceed with steroid injection A steroid injection was performed at left lateral foot using 1% plain Lidocaine  and 10 mg of Kenalog . This was well tolerated.  Pes planovalgus/foot deformity -I explained to patient the etiology of pes planovalgus and relationship with heel pain/arch pain and various treatment options were discussed.  Given patient foot structure in the setting of heel pain/arch pain I believe patient will benefit from custom-made orthotics to help control the hindfoot motion support the arch of the foot and take the stress away from arches.  Patient agrees with the plan like to proceed with orthotics -Patient was casted for orthotics   No follow-ups on file.

## 2024-06-24 ENCOUNTER — Ambulatory Visit: Payer: Medicaid Other | Admitting: Family Medicine

## 2024-06-24 ENCOUNTER — Encounter: Payer: Self-pay | Admitting: Family Medicine

## 2024-06-24 VITALS — BP 140/72 | HR 76 | Temp 98.2°F | Ht 62.0 in | Wt 125.6 lb

## 2024-06-24 DIAGNOSIS — R300 Dysuria: Secondary | ICD-10-CM

## 2024-06-24 DIAGNOSIS — Z0001 Encounter for general adult medical examination with abnormal findings: Secondary | ICD-10-CM | POA: Diagnosis not present

## 2024-06-24 DIAGNOSIS — Z0000 Encounter for general adult medical examination without abnormal findings: Secondary | ICD-10-CM

## 2024-06-24 DIAGNOSIS — I10 Essential (primary) hypertension: Secondary | ICD-10-CM | POA: Diagnosis not present

## 2024-06-24 LAB — URINALYSIS, ROUTINE W REFLEX MICROSCOPIC
Bilirubin Urine: NEGATIVE
Glucose, UA: NEGATIVE
Hgb urine dipstick: NEGATIVE
Hyaline Cast: NONE SEEN /LPF
Ketones, ur: NEGATIVE
Nitrite: NEGATIVE
Protein, ur: NEGATIVE
RBC / HPF: NONE SEEN /HPF (ref 0–2)
Specific Gravity, Urine: 1.02 (ref 1.001–1.035)
pH: 5.5 (ref 5.0–8.0)

## 2024-06-24 LAB — MICROSCOPIC MESSAGE

## 2024-06-24 MED ORDER — CIPROFLOXACIN HCL 500 MG PO TABS: 500.0000 mg | ORAL_TABLET | Freq: Two times a day (BID) | ORAL | 0 refills | Status: DC

## 2024-06-24 MED ORDER — FLUCONAZOLE 150 MG PO TABS: 150.0000 mg | ORAL_TABLET | Freq: Once | ORAL | 0 refills | Status: DC

## 2024-06-24 NOTE — Progress Notes (Signed)
 Subjective:    Patient ID: Heather Gillespie, female    DOB: [birth year], 63 y.o.   MRN: [id number]  Dysuria     Patient is here today for complete physical exam.  Patient is due for a flu shot.  Pneumonia shot, shingles shot.  Her brother has a history of Guillain-Barr syndrome.  Therefore she refuses all vaccinations.  Her last colonoscopy was performed this year due to hematochezia.  Biopsy revealed just a polyp.  GI gave her 5-year surveillance window.  Her last mammogram was November 2024.  She is already scheduled her mammogram for this year.  Her last Pap smear was in 2023.  This is due again next year.  She had a bone density test earlier this year in April.  Her worst T-score was -3.4 in the hip.  She was -3.4 in 2023.  Her worst T-score was -2.9 in 2020.  Therefore there has been a steady downward progression of her bone density over the last 5 years.  She has been taking Fosamax .  We discussed bone building therapy now that her T-score is worse than -3.  We discussed Forteo.  We discussed Evenity.  I recommended referral to endocrinology to discuss these treatments.  Patient declined that at the present time however she will discuss it with her brother.  She also reports several days of burning with urination.  She states that every time she pees, it feels like there is fire coming out.  She denies any fevers or chills.  She also complains of itching around the labia.  She denies any vaginal discharge.  Urinalysis today shows +2 leukocyte esterase, negative nitrates, negative blood Past Medical History:  Diagnosis Date   Abdominal pain    Allergy    GERD (gastroesophageal reflux disease)    Glaucoma    Hemorrhoids    Hiatal hernia    HTN (hypertension)    IBS (irritable bowel syndrome)    N&V (nausea and vomiting)    Osteoporosis    Ovarian cyst    Rectal bleeding    Rectal ulcer    Sinus problem    TMJ (dislocation of temporomandibular joint)    Vitamin D  deficiency    Past  Surgical History:  Procedure Laterality Date   ANAL FISSURE REPAIR     BACK SURGERY     CHOLECYSTECTOMY  1984   COLONOSCOPY W/ POLYPECTOMY     colon ulcers   EYE SURGERY     FOOT SURGERY     HEMI-MICRODISCECTOMY LUMBAR LAMINECTOMY LEVEL 1 Left 05/26/2013   Procedure: HEMI-MICRODISCECTOMY LUMBAR LAMINECTOMY L4 - L5 ON THE LEFT LEVEL 1;  Surgeon: Tanda DELENA Heading, MD;  Location: [facility];  Service: Orthopedics;  Laterality: Left;   HEMORROIDECTOMY     Current Outpatient Medications on File Prior to Visit  Medication Sig Dispense Refill   alendronate  (FOSAMAX ) 70 MG tablet TAKE 1 TABLET BY MOUTH EVERY 7 DAYS. TAKE WITH A FULL GLASS OF WATER ON AN EMPTY STOMACH. 4 tablet 11   cromolyn  (OPTICROM ) 4 % ophthalmic solution INSTILL 1 DROP INTO BOTH EYES 4 TIMES A DAY. Strength: 4 % 10 mL 2   dexlansoprazole (DEXILANT) 60 MG capsule Take 60 mg by mouth daily.     Ergocalciferol  (VITAMIN D2) 2000 units TABS Take 1 tablet by mouth daily.     fexofenadine  (ALLEGRA ) 180 MG tablet Take 1 tablet (180 mg total) by mouth daily. 90 tablet 1   fluticasone  (FLONASE ) 50 MCG/ACT nasal spray Place  2 sprays into both nostrils daily. 48 mL 3   gabapentin  (NEURONTIN ) 300 MG capsule TAKE 1 CAPSULE BY MOUTH THREE TIMES A DAY 90 capsule 5   hydrochlorothiazide  (HYDRODIURIL ) 25 MG tablet TAKE 1 TABLET (25 MG TOTAL) BY MOUTH DAILY. 90 tablet 1   losartan  (COZAAR ) 100 MG tablet Take 1 tablet (100 mg total) by mouth daily. 90 tablet 1   ondansetron  (ZOFRAN ) 4 MG tablet Take 1 tablet (4 mg total) by mouth every 6 (six) hours. 12 tablet 0   oxyCODONE -acetaminophen  (PERCOCET) 5-325 MG tablet Take 1 tablet by mouth every 4 (four) hours as needed for severe pain (pain score 7-10). 30 tablet 0   Probiotic, Lactobacillus, CAPS Take 1 capsule by mouth daily. 30 capsule 5   promethazine  (PHENERGAN ) 25 MG tablet Take 1 tablet (25 mg total) by mouth every 6 (six) hours as needed for nausea or vomiting. 10 tablet 0    sulfamethoxazole -trimethoprim  (BACTRIM  DS) 800-160 MG tablet TAKE 1 TABLET BY MOUTH EVERY DAY 30 tablet 11   No current facility-administered medications on file prior to visit.   Allergies  Allergen Reactions   Rosuvastatin  Calcium  Anaphylaxis   Hydroxyzine Hives    Other reaction(s): Unknown   Lotronex [Alosetron Hcl]     Other reaction(s): Unknown   Other Other (See Comments)   Hydroxyzine Pamoate Rash   Social History   Socioeconomic History   Marital status: Single    Spouse name: Not on file   Number of children: Not on file   Years of education: Not on file   Highest education level: Not on file  Occupational History   Not on file  Tobacco Use   Smoking status: Never   Smokeless tobacco: Never  Vaping Use   Vaping status: Never Used  Substance and Sexual Activity   Alcohol use: No   Drug use: No   Sexual activity: Not Currently    Birth control/protection: Post-menopausal  Other Topics Concern   Not on file  Social History Narrative   Not on file   Social Drivers of Health   Financial Resource Strain: Low Risk  (08/14/2022)   Overall Financial Resource Strain (CARDIA)    Difficulty of Paying Living Expenses: Not very hard  Food Insecurity: No Food Insecurity (08/14/2022)   Hunger Vital Sign    Worried About Running Out of Food in the Last Year: Never true    Ran Out of Food in the Last Year: Never true  Transportation Needs: No Transportation Needs (08/14/2022)   PRAPARE - Administrator, Civil Service (Medical): No    Lack of Transportation (Non-Medical): No  Physical Activity: Inactive (08/14/2022)   Exercise Vital Sign    Days of Exercise per Week: 0 days    Minutes of Exercise per Session: 0 min  Stress: No Stress Concern Present (08/14/2022)   [employer] of Occupational Health - Occupational Stress Questionnaire    Feeling of Stress : Not at all  Social Connections: Socially Isolated (08/14/2022)   Social Connection and  Isolation Panel    Frequency of Communication with Friends and Family: More than three times a week    Frequency of Social Gatherings with Friends and Family: Twice a week    Attends Religious Services: Never    Database administrator or Organizations: No    Attends Banker Meetings: Never    Marital Status: Never married  Intimate Partner Violence: Not At Risk (08/14/2022)   Humiliation, Afraid, Rape,  and Kick questionnaire    Fear of Current or Ex-Partner: No    Emotionally Abused: No    Physically Abused: No    Sexually Abused: No   Family History  Problem Relation Age of Onset   Diabetes Mother    Cancer Mother    Stroke Father    Breast cancer Maternal Aunt       Review of Systems  Genitourinary:  Positive for dysuria.  All other systems reviewed and are negative.      Objective:   Physical Exam Vitals reviewed.  Constitutional:      General: She is not in acute distress.    Appearance: Normal appearance. She is well-developed. She is not ill-appearing, toxic-appearing or diaphoretic.  HENT:     Head: Normocephalic and atraumatic.     Right Ear: Tympanic membrane, ear canal and external ear normal.     Left Ear: Tympanic membrane, ear canal and external ear normal.     Nose: Nose normal. No congestion or rhinorrhea.     Mouth/Throat:     Mouth: Mucous membranes are moist.     Pharynx: No oropharyngeal exudate or posterior oropharyngeal erythema.  Eyes:     General: No scleral icterus.       Right eye: No discharge.        Left eye: No discharge.     Extraocular Movements: Extraocular movements intact.     Conjunctiva/sclera: Conjunctivae normal.     Pupils: Pupils are equal, round, and reactive to light.  Neck:     Thyroid: No thyromegaly.     Vascular: No carotid bruit or JVD.     Trachea: No tracheal deviation.  Cardiovascular:     Rate and Rhythm: Normal rate and regular rhythm.     Pulses: Normal pulses.     Heart sounds: Normal heart  sounds. No murmur heard.    No friction rub. No gallop.  Pulmonary:     Effort: Pulmonary effort is normal. No respiratory distress.     Breath sounds: Normal breath sounds. No stridor. No wheezing, rhonchi or rales.  Chest:     Chest wall: No tenderness.  Abdominal:     General: Bowel sounds are normal. There is no distension.     Palpations: Abdomen is soft. There is no mass.     Tenderness: There is no abdominal tenderness. There is no right CVA tenderness, left CVA tenderness, guarding or rebound.     Hernia: No hernia is present.  Musculoskeletal:        General: No tenderness. Normal range of motion.     Cervical back: Normal range of motion and neck supple. No rigidity or tenderness.     Right lower leg: No edema.     Left lower leg: No edema.  Lymphadenopathy:     Cervical: No cervical adenopathy.  Skin:    General: Skin is warm.     Coloration: Skin is not jaundiced or pale.     Findings: No bruising, erythema, lesion or rash.  Neurological:     General: No focal deficit present.     Mental Status: She is alert and oriented to person, place, and time. Mental status is at baseline.     Cranial Nerves: No cranial nerve deficit.     Sensory: No sensory deficit.     Motor: No weakness or abnormal muscle tone.     Coordination: Coordination normal.     Gait: Gait normal.     Deep  Tendon Reflexes: Reflexes are normal and symmetric. Reflexes normal.  Psychiatric:        Mood and Affect: Mood normal.        Behavior: Behavior normal.        Thought Content: Thought content normal.        Judgment: Judgment normal.           Assessment & Plan:  Dysuria - Plan: Urinalysis, Routine w reflex microscopic  Benign essential HTN - Plan: CBC with Differential/Platelet, Comprehensive metabolic panel with GFR, Lipid panel  General medical exam - Plan: CBC with Differential/Platelet, Comprehensive metabolic panel with GFR, Lipid panel  urinalysis suggest a urinary tract  infection.  Itching around the labia suggest a possible yeast infection.  My treated urinary tract infection with Cipro  500 mg twice daily for 5 days.  Will treat a possible yeast infection with Diflucan  150 mg p.o. x 1.  Patient has already scheduled her mammogram.  Her colonoscopy is up-to-date.  Her Pap smear is due next year.  I recommended an endocrinologist to discuss bone building therapy for her osteoporosis.  She declines them at the present time.  Also recommended a flu shot, shingles vaccine, and pneumonia vaccine.  She declines these because of her family history of Guillain-Barr syndrome.  Her blood pressure today is acceptable.  I will check a CBC, CMP, panel

## 2024-06-25 LAB — COMPREHENSIVE METABOLIC PANEL WITH GFR
AG Ratio: 1.7 (calc) (ref 1.0–2.5)
ALT: 6 U/L (ref 6–29)
AST: 16 U/L (ref 10–35)
Albumin: 4.8 g/dL (ref 3.6–5.1)
Alkaline phosphatase (APISO): 62 U/L (ref 37–153)
BUN: 15 mg/dL (ref 7–25)
CO2: 22 mmol/L (ref 20–32)
Calcium: 10.1 mg/dL (ref 8.6–10.4)
Chloride: 105 mmol/L (ref 98–110)
Creat: 0.81 mg/dL (ref 0.50–1.05)
Globulin: 2.8 g/dL (ref 1.9–3.7)
Glucose, Bld: 80 mg/dL (ref 65–99)
Potassium: 4 mmol/L (ref 3.5–5.3)
Sodium: 139 mmol/L (ref 135–146)
Total Bilirubin: 0.6 mg/dL (ref 0.2–1.2)
Total Protein: 7.6 g/dL (ref 6.1–8.1)
eGFR: 81 mL/min/1.73m2 (ref >=60)

## 2024-06-25 LAB — CBC WITH DIFFERENTIAL/PLATELET
Absolute Lymphocytes: 898 {cells}/uL (ref 850–3900)
Absolute Monocytes: 230 {cells}/uL (ref 200–950)
Basophils Absolute: 19 {cells}/uL (ref 0–200)
Basophils Relative: 0.4 %
Eosinophils Absolute: 9 {cells}/uL — ABNORMAL LOW (ref 15–500)
Eosinophils Relative: 0.2 %
HCT: 35.5 % (ref 35.0–45.0)
Hemoglobin: 11.8 g/dL (ref 11.7–15.5)
MCH: 29.7 pg (ref 27.0–33.0)
MCHC: 33.2 g/dL (ref 32.0–36.0)
MCV: 89.4 fL (ref 80.0–100.0)
MPV: 10.9 fL (ref 7.5–12.5)
Monocytes Relative: 4.9 %
Neutro Abs: 3544 {cells}/uL (ref 1500–7800)
Neutrophils Relative %: 75.4 %
Platelets: 201 Thousand/uL (ref 140–400)
RBC: 3.97 Million/uL (ref 3.80–5.10)
RDW: 12.4 % (ref 11.0–15.0)
Total Lymphocyte: 19.1 %
WBC: 4.7 Thousand/uL (ref 3.8–10.8)

## 2024-06-25 LAB — LIPID PANEL
Cholesterol: 238 mg/dL — ABNORMAL HIGH (ref <200)
HDL: 87 mg/dL (ref >=50)
LDL Cholesterol (Calc): 135 mg/dL — ABNORMAL HIGH
Non-HDL Cholesterol (Calc): 151 mg/dL — ABNORMAL HIGH (ref <130)
Total CHOL/HDL Ratio: 2.7 (calc) (ref <5.0)
Triglycerides: 66 mg/dL (ref <150)

## 2024-06-27 ENCOUNTER — Ambulatory Visit: Payer: Self-pay | Admitting: Family Medicine

## 2024-06-28 ENCOUNTER — Telehealth: Payer: Self-pay

## 2024-06-28 NOTE — Telephone Encounter (Signed)
 Copied from CRM [id number]. Topic: General - Call Back - No Documentation >> Jun 28, 2024 10:58 AM Darshell M wrote: Reason for CRM: Patient calling to advise Dr. Duanne that antibiotics are working.

## 2024-07-18 ENCOUNTER — Other Ambulatory Visit: Payer: Self-pay | Admitting: Family Medicine

## 2024-07-19 ENCOUNTER — Other Ambulatory Visit: Payer: Self-pay

## 2024-07-19 ENCOUNTER — Telehealth: Payer: Self-pay

## 2024-07-19 DIAGNOSIS — J309 Allergic rhinitis, unspecified: Secondary | ICD-10-CM

## 2024-07-19 MED ORDER — CROMOLYN SODIUM 4 % OP SOLN: OPHTHALMIC | 2 refills | Status: DC

## 2024-07-19 NOTE — Telephone Encounter (Signed)
 Sent in medication

## 2024-07-19 NOTE — Telephone Encounter (Signed)
 Prescription Request  07/19/2024  LOV: 06/24/24  What is the name of the medication or equipment? cromolyn  (OPTICROM ) 4 % ophthalmic solution [[id number]]   Have you contacted your pharmacy to request a refill? Yes   Which pharmacy would you like this sent to?  [id number] [address], [place] - [address] [place] [address] Phone: [phone] Fax: [phone]    Patient notified that their request is being sent to the clinical staff for review and that they should receive a response within 2 business days.   Please advise at [facility] [phone]

## 2024-07-20 ENCOUNTER — Ambulatory Visit (INDEPENDENT_AMBULATORY_CARE_PROVIDER_SITE_OTHER): Payer: PRIVATE HEALTH INSURANCE | Admitting: Podiatry

## 2024-07-20 DIAGNOSIS — M7672 Peroneal tendinitis, left leg: Secondary | ICD-10-CM

## 2024-07-20 NOTE — Progress Notes (Signed)
 Subjective:  Patient ID: Heather Gillespie, female    DOB: [birth year],  MRN: [id number]  Chief Complaint  Patient presents with   Foot Pain    63 y.o. female presents with the above complaint.  Patient presents for follow-up of left peroneal tendinitis.  She states she is doing a lot better she has no pain peroneal tendon.  She states injection helped considerably.  Here to pick up her orthotics.  Review of Systems: Negative except as noted in the HPI. Denies N/V/F/Ch.  Past Medical History:  Diagnosis Date   Abdominal pain    Allergy    GERD (gastroesophageal reflux disease)    Glaucoma    Hemorrhoids    Hiatal hernia    HTN (hypertension)    IBS (irritable bowel syndrome)    N&V (nausea and vomiting)    Osteoporosis    Ovarian cyst    Rectal bleeding    Rectal ulcer    Sinus problem    TMJ (dislocation of temporomandibular joint)    Vitamin D  deficiency     Current Outpatient Medications:    alendronate  (FOSAMAX ) 70 MG tablet, TAKE 1 TABLET BY MOUTH EVERY 7 DAYS. TAKE WITH A FULL GLASS OF WATER ON AN EMPTY STOMACH., Disp: 4 tablet, Rfl: 11   ciprofloxacin  (CIPRO ) 500 MG tablet, TAKE 1 TABLET BY MOUTH TWICE A DAY FOR 5 DAYS, Disp: 10 tablet, Rfl: 0   cromolyn  (OPTICROM ) 4 % ophthalmic solution, INSTILL 1 DROP INTO BOTH EYES 4 TIMES A DAY. Strength: 4 %, Disp: 10 mL, Rfl: 2   dexlansoprazole (DEXILANT) 60 MG capsule, Take 60 mg by mouth daily., Disp: , Rfl:    Ergocalciferol  (VITAMIN D2) 2000 units TABS, Take 1 tablet by mouth daily., Disp: , Rfl:    fexofenadine  (ALLEGRA ) 180 MG tablet, Take 1 tablet (180 mg total) by mouth daily., Disp: 90 tablet, Rfl: 1   fluticasone  (FLONASE ) 50 MCG/ACT nasal spray, Place 2 sprays into both nostrils daily., Disp: 48 mL, Rfl: 3   gabapentin  (NEURONTIN ) 300 MG capsule, TAKE 1 CAPSULE BY MOUTH THREE TIMES A DAY, Disp: 90 capsule, Rfl: 5   hydrochlorothiazide  (HYDRODIURIL ) 25 MG tablet, TAKE 1 TABLET (25 MG TOTAL) BY MOUTH DAILY., Disp: 90  tablet, Rfl: 1   losartan  (COZAAR ) 100 MG tablet, Take 1 tablet (100 mg total) by mouth daily., Disp: 90 tablet, Rfl: 1   ondansetron  (ZOFRAN ) 4 MG tablet, Take 1 tablet (4 mg total) by mouth every 6 (six) hours., Disp: 12 tablet, Rfl: 0   oxyCODONE -acetaminophen  (PERCOCET) 5-325 MG tablet, Take 1 tablet by mouth every 4 (four) hours as needed for severe pain (pain score 7-10)., Disp: 30 tablet, Rfl: 0   Probiotic, Lactobacillus, CAPS, Take 1 capsule by mouth daily., Disp: 30 capsule, Rfl: 5   promethazine  (PHENERGAN ) 25 MG tablet, Take 1 tablet (25 mg total) by mouth every 6 (six) hours as needed for nausea or vomiting., Disp: 10 tablet, Rfl: 0   sulfamethoxazole -trimethoprim  (BACTRIM  DS) 800-160 MG tablet, TAKE 1 TABLET BY MOUTH EVERY DAY, Disp: 30 tablet, Rfl: 11  Social History   Tobacco Use  Smoking Status Never  Smokeless Tobacco Never    Allergies  Allergen Reactions   Rosuvastatin  Calcium  Anaphylaxis   Hydroxyzine Hives    Other reaction(s): Unknown   Lotronex [Alosetron Hcl]     Other reaction(s): Unknown   Other Other (See Comments)   Hydroxyzine Pamoate Rash   Objective:  There were no vitals filed for this visit. There  is no height or weight on file to calculate BMI. Constitutional Well developed. Well nourished.  Vascular Dorsalis pedis pulses palpable bilaterally. Posterior tibial pulses palpable bilaterally. Capillary refill normal to all digits.  No cyanosis or clubbing noted. Pedal hair growth normal.  Neurologic Normal speech. Oriented to person, place, and time. Epicritic sensation to light touch grossly present bilaterally.  Dermatologic Nails well groomed and normal in appearance. No open wounds. No skin lesions.  Orthopedic: Pain on palpation along the course of the peroneal tendon including the insertion.  Weakness noted of the peroneal tendon on resisted dorsiflexion eversion of the foot.  Manual muscle strength 2 out of 5.  May likely be due to pain.   No open wounds or lesion noted.   Radiographs: None Assessment:   1. Peroneal tendinitis, left      Plan:  Patient was evaluated and treated and all questions answered.  Left peroneal tendinitis - Clinically healed the patient discharged from the care of any foot and ankle issues in future she will come back and see me.  I discussed shoe gear modification orthotics management she states understanding  Pes planovalgus/foot deformity -I explained to patient the etiology of pes planovalgus and relationship with heel pain/arch pain and various treatment options were discussed.  Given patient foot structure in the setting of heel pain/arch pain I believe patient will benefit from custom-made orthotics to help control the hindfoot motion support the arch of the foot and take the stress away from arches.  Patient agrees with the plan like to proceed with orthotics - Orthotics were dispensed   No follow-ups on file.

## 2024-07-25 ENCOUNTER — Ambulatory Visit
Admission: RE | Admit: 2024-07-25 | Discharge: 2024-07-25 | Disposition: A | Discharge status: Home / Self Care | Source: Ambulatory Visit | Attending: Family Medicine | Admitting: Family Medicine

## 2024-07-25 DIAGNOSIS — Z1231 Encounter for screening mammogram for malignant neoplasm of breast: Secondary | ICD-10-CM

## 2024-08-10 ENCOUNTER — Other Ambulatory Visit: Payer: Self-pay

## 2024-08-10 NOTE — Telephone Encounter (Addendum)
 Prescription Request  08/10/2024  LOV: 06/24/24  What is the name of the medication or equipment? ciprofloxacin  (CIPRO ) 500 MG tablet [[id number]]   Have you contacted your pharmacy to request a refill? Yes   Which pharmacy would you like this sent to?  [id number] [address], [place] - [address] [place] [address] Phone: [phone] Fax: [phone]    Patient notified that their request is being sent to the clinical staff for review and that they should receive a response within 2 business days.   Please advise at [facility] [phone]  Prescription Request  08/10/2024  LOV: 06/24/24  What is the name of the medication or equipment? fluticasone  (FLONASE ) 50 MCG/ACT nasal spray [[id number]]   Have you contacted your pharmacy to request a refill? Yes   Which pharmacy would you like this sent to?  [id number] [address], [place] - [address] [place] [address] Phone: [phone] Fax: [phone]    Patient notified that their request is being sent to the clinical staff for review and that they should receive a response within 2 business days.   Please advise at [facility] [phone]

## 2024-08-10 NOTE — Telephone Encounter (Signed)
 Patient called and says she doesn't need the cipro , but will need the flonase . [facility] know [facility]'s no longer taking that medication. She says she will.

## 2024-08-15 MED ORDER — FLUTICASONE PROPIONATE 50 MCG/ACT NA SUSP: 2.0000 | Freq: Every day | NASAL | 0 refills | Status: DC

## 2024-08-15 NOTE — Telephone Encounter (Signed)
 Requested Prescriptions  Pending Prescriptions Disp Refills   fluticasone  (FLONASE ) 50 MCG/ACT nasal spray 48 mL 0    Sig: Place 2 sprays into both nostrils daily.     Ear, Nose, and Throat: Nasal Preparations - Corticosteroids Passed - 08/15/2024  9:36 AM      Passed - Valid encounter within last 12 months    Recent Outpatient Visits           1 month ago Dysuria   [facility] [facility] Pickard, Butler DASEN, MD   7 months ago Pure hypercholesterolemia   [facility] Pickard, Butler DASEN, MD   1 year ago Benign essential HTN   [place] [facility] Pickard, Butler DASEN, MD   1 year ago Gross hematuria   [place] [facility] Duanne Butler DASEN, MD   2 years ago Gross hematuria   [facility] Pickard, Butler DASEN, MD

## 2024-09-12 ENCOUNTER — Other Ambulatory Visit: Payer: Self-pay

## 2024-09-12 ENCOUNTER — Telehealth: Payer: Self-pay

## 2024-09-12 MED ORDER — GABAPENTIN 300 MG PO CAPS: ORAL_CAPSULE | ORAL | 5 refills | Status: AC

## 2024-09-12 NOTE — Telephone Encounter (Signed)
 Sent in medication

## 2024-09-12 NOTE — Telephone Encounter (Signed)
 Prescription Request  09/12/2024  LOV: 06/24/24  What is the name of the medication or equipment? gabapentin  (NEURONTIN ) 300 MG capsule [[id number]]   Have you contacted your pharmacy to request a refill? Yes   Which pharmacy would you like this sent to?  [id number] [address], [place] - [address] [place] [address] Phone: [phone] Fax: [phone]    Patient notified that their request is being sent to the clinical staff for review and that they should receive a response within 2 business days.   Please advise at [facility] [phone]

## 2024-10-10 ENCOUNTER — Other Ambulatory Visit: Payer: Self-pay

## 2024-10-10 ENCOUNTER — Telehealth: Payer: Self-pay | Admitting: Family Medicine

## 2024-10-10 DIAGNOSIS — I10 Essential (primary) hypertension: Secondary | ICD-10-CM

## 2024-10-10 NOTE — Telephone Encounter (Signed)
 Prescription Request  10/10/2024  LOV: Visit date not found  What is the name of the medication or equipment? hydrochlorothiazide  (HYDRODIURIL ) 25 MG tablet   Have you contacted your pharmacy to request a refill? Yes   Which pharmacy would you like this sent to?  [id number] [address], [place] - [address] [place] [address] Phone: [phone] Fax: [phone]    Patient notified that their request is being sent to the clinical staff for review and that they should receive a response within 2 business days.   Please advise at [facility] [phone] (mobile)

## 2024-10-10 NOTE — Telephone Encounter (Signed)
 Error

## 2024-10-10 NOTE — Telephone Encounter (Signed)
 Prescription Request  10/10/2024  LOV: Visit date not found  What is the name of the medication or equipment?   fluticasone  (FLONASE ) 50 MCG/ACT nasal spray    Have you contacted your pharmacy to request a refill? Yes   Which pharmacy would you like this sent to?  [id number] [address], [place] - [address] [place] [address] Phone: [phone] Fax: [phone]    Patient notified that their request is being sent to the clinical staff for review and that they should receive a response within 2 business days.   Please advise at [facility] [phone] (mobile)

## 2024-10-11 ENCOUNTER — Other Ambulatory Visit: Payer: Self-pay

## 2024-10-11 MED ORDER — HYDROCHLOROTHIAZIDE 25 MG PO TABS: 25.0000 mg | ORAL_TABLET | Freq: Every day | ORAL | 1 refills | Status: AC

## 2024-10-11 NOTE — Addendum Note (Signed)
 Addended by: DEWARD BRADLEY F on: 10/11/2024 04:01 PM   Modules accepted: Orders

## 2024-10-11 NOTE — Telephone Encounter (Signed)
 Prescription Request  10/11/2024  LOV: 06/24/24  What is the name of the medication or equipment? fluticasone  (FLONASE ) 50 MCG/ACT nasal spray [[id number]]   Have you contacted your pharmacy to request a refill? Yes   Which pharmacy would you like this sent to?  [id number] [address], [place] - [address] [place] [address] Phone: [phone] Fax: [phone]    Patient notified that their request is being sent to the clinical staff for review and that they should receive a response within 2 business days.   Please advise at [facility] [phone]

## 2024-10-11 NOTE — Telephone Encounter (Signed)
 Requested medication (s) are due for refill today -provider review   Requested medication (s) are on the active medication list -yes  Future visit scheduled -yes  Last refill: 08/15/24  Notes to clinic: Unable to change provider or get protocol- for this request- sent for review   Requested Prescriptions  Pending Prescriptions Disp Refills   fluticasone  (FLONASE ) 50 MCG/ACT nasal spray 48 mL 0    Sig: Place 2 sprays into both nostrils daily.     There is no refill protocol information for this order       Requested Prescriptions  Pending Prescriptions Disp Refills   fluticasone  (FLONASE ) 50 MCG/ACT nasal spray 48 mL 0    Sig: Place 2 sprays into both nostrils daily.     There is no refill protocol information for this order

## 2024-10-11 NOTE — Telephone Encounter (Signed)
 Requested Prescriptions  Pending Prescriptions Disp Refills   hydrochlorothiazide  (HYDRODIURIL ) 25 MG tablet 90 tablet 1    Sig: Take 1 tablet (25 mg total) by mouth daily.     Cardiovascular: Diuretics - Thiazide Failed - 10/11/2024  4:06 PM      Failed - Last BP in normal range    BP Readings from Last 1 Encounters:  06/24/24 (!) 140/72         Passed - Cr in normal range and within 180 days    Creat  Date Value Ref Range Status  06/24/2024 0.81 0.50 - 1.05 mg/dL Final         Passed - K in normal range and within 180 days    Potassium  Date Value Ref Range Status  06/24/2024 4.0 3.5 - 5.3 mmol/L Final         Passed - Na in normal range and within 180 days    Sodium  Date Value Ref Range Status  06/24/2024 139 135 - 146 mmol/L Final         Passed - Valid encounter within last 6 months    Recent Outpatient Visits           3 months ago Dysuria   [place] [facility] Duanne Butler DASEN, MD   9 months ago Pure hypercholesterolemia   [place] [facility] Pickard, Butler DASEN, MD   1 year ago Benign essential HTN   [place] [facility] Duanne Butler DASEN, MD   2 years ago Gross hematuria   [place] [facility] Duanne Butler DASEN, MD   2 years ago Gross hematuria    [facility] Pickard, Butler DASEN, MD

## 2024-10-12 MED ORDER — FLUTICASONE PROPIONATE 50 MCG/ACT NA SUSP: 2.0000 | Freq: Every day | NASAL | 0 refills | Status: AC

## 2024-10-12 NOTE — Telephone Encounter (Signed)
 Requested by patient. Future visit 06/26/25.  Requested Prescriptions  Pending Prescriptions Disp Refills   fluticasone  (FLONASE ) 50 MCG/ACT nasal spray 48 mL 0    Sig: Place 2 sprays into both nostrils daily.     Ear, Nose, and Throat: Nasal Preparations - Corticosteroids Passed - 10/12/2024  1:34 PM      Passed - Valid encounter within last 12 months    Recent Outpatient Visits           3 months ago Dysuria   [place] [facility] Pickard, Butler DASEN, MD   9 months ago Pure hypercholesterolemia   [place] [facility] Pickard, Butler DASEN, MD   1 year ago Benign essential HTN   [facility] Pickard, Butler DASEN, MD   2 years ago Gross hematuria   [place] [facility] Duanne Butler DASEN, MD   2 years ago Gross hematuria   [place] [facility] Pickard, Butler DASEN, MD

## 2024-10-21 ENCOUNTER — Other Ambulatory Visit: Payer: Self-pay

## 2024-10-21 ENCOUNTER — Other Ambulatory Visit: Payer: Self-pay | Admitting: Family Medicine

## 2024-10-21 ENCOUNTER — Telehealth: Payer: Self-pay | Admitting: Family Medicine

## 2024-10-21 DIAGNOSIS — J309 Allergic rhinitis, unspecified: Secondary | ICD-10-CM

## 2024-10-21 MED ORDER — FEXOFENADINE HCL 180 MG PO TABS: 180.0000 mg | ORAL_TABLET | Freq: Every day | ORAL | 1 refills | Status: AC

## 2024-10-21 NOTE — Telephone Encounter (Signed)
 Prescription Request  10/21/2024  LOV: 06/24/2024  What is the name of the medication or equipment? fexofenadine  (ALLEGRA ) 180 MG tablet   Have you contacted your pharmacy to request a refill? Yes   Which pharmacy would you like this sent to?  [id number] [place], [place] - [address] [place] [address] Phone: [phone] Fax: [phone]    Patient notified that their request is being sent to the clinical staff for review and that they should receive a response within 2 business days.   Please advise at [facility] [phone]

## 2024-10-21 NOTE — Telephone Encounter (Signed)
 Requested Prescriptions  Pending Prescriptions Disp Refills   cromolyn  (OPTICROM ) 4 % ophthalmic solution [Pharmacy Med Name: CROMOLYN  4% EYE DROPS] 10 mL 2    Sig: INSTILL 1 DROP INTO BOTH EYES 4 TIMES A DAY. STRENGTH: 4 %     Ophthalmology:  Antiallergy Passed - 10/21/2024  5:39 PM      Passed - Valid encounter within last 12 months    Recent Outpatient Visits           3 months ago Dysuria   [place] [facility] Pickard, Butler DASEN, MD   9 months ago Pure hypercholesterolemia   [place] [facility] Pickard, Butler DASEN, MD   1 year ago Benign essential HTN   [facility] Pickard, Butler DASEN, MD   2 years ago Gross hematuria   [place] [facility] Duanne Butler DASEN, MD   2 years ago Gross hematuria    [facility] Pickard, Butler DASEN, MD

## 2025-06-26 ENCOUNTER — Encounter: Admitting: Family Medicine
